# Patient Record
Sex: Female | Born: 1960
Health system: Southern US, Community
[De-identification: ages and names within clinical notes are randomized; demographics above are authoritative.]

## PROBLEM LIST (undated history)

## (undated) DIAGNOSIS — Z0189 Encounter for other specified special examinations: Secondary | ICD-10-CM

## (undated) DIAGNOSIS — E785 Hyperlipidemia, unspecified: Secondary | ICD-10-CM

## (undated) DIAGNOSIS — L0390 Cellulitis, unspecified: Secondary | ICD-10-CM

## (undated) DIAGNOSIS — F419 Anxiety disorder, unspecified: Secondary | ICD-10-CM

## (undated) DIAGNOSIS — I10 Essential (primary) hypertension: Secondary | ICD-10-CM

## (undated) DIAGNOSIS — T4145XA Adverse effect of unspecified anesthetic, initial encounter: Secondary | ICD-10-CM

## (undated) DIAGNOSIS — T8859XA Other complications of anesthesia, initial encounter: Secondary | ICD-10-CM

## (undated) DIAGNOSIS — I2510 Atherosclerotic heart disease of native coronary artery without angina pectoris: Secondary | ICD-10-CM

## (undated) DIAGNOSIS — F329 Major depressive disorder, single episode, unspecified: Secondary | ICD-10-CM

## (undated) DIAGNOSIS — K219 Gastro-esophageal reflux disease without esophagitis: Secondary | ICD-10-CM

## (undated) DIAGNOSIS — F32A Depression, unspecified: Secondary | ICD-10-CM

## (undated) DIAGNOSIS — I340 Nonrheumatic mitral (valve) insufficiency: Secondary | ICD-10-CM

## (undated) DIAGNOSIS — K5900 Constipation, unspecified: Secondary | ICD-10-CM

## (undated) DIAGNOSIS — R202 Paresthesia of skin: Secondary | ICD-10-CM

## (undated) HISTORY — DX: Hyperlipidemia, unspecified: E78.5

## (undated) HISTORY — DX: Encounter for other specified special examinations: Z01.89

## (undated) HISTORY — DX: Paresthesia of skin: R20.2

## (undated) HISTORY — PX: HERNIA REPAIR: SHX51

## (undated) HISTORY — PX: OOPHORECTOMY: SHX86

## (undated) HISTORY — PX: COLONOSCOPY WITH ESOPHAGOGASTRODUODENOSCOPY (EGD): SHX5779

## (undated) HISTORY — PX: TOTAL ABDOMINAL HYSTERECTOMY: SHX209

## (undated) HISTORY — PX: OTHER SURGICAL HISTORY: SHX169

## (undated) HISTORY — PX: ABDOMINAL HYSTERECTOMY: SHX81

---
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
## 2004-08-07 ENCOUNTER — Encounter: Admission: RE | Admit: 2004-08-07 | Discharge: 2004-08-07 | Payer: Self-pay | Admitting: Sports Medicine

## 2006-12-15 ENCOUNTER — Ambulatory Visit: Payer: Self-pay | Admitting: Sports Medicine

## 2006-12-15 ENCOUNTER — Encounter: Admission: RE | Admit: 2006-12-15 | Discharge: 2006-12-15 | Payer: Self-pay | Admitting: Sports Medicine

## 2007-07-13 ENCOUNTER — Ambulatory Visit: Payer: Self-pay | Admitting: Sports Medicine

## 2007-07-13 ENCOUNTER — Encounter (INDEPENDENT_AMBULATORY_CARE_PROVIDER_SITE_OTHER): Payer: Self-pay | Admitting: *Deleted

## 2007-07-13 DIAGNOSIS — M7710 Lateral epicondylitis, unspecified elbow: Secondary | ICD-10-CM | POA: Insufficient documentation

## 2007-08-05 ENCOUNTER — Encounter: Payer: Self-pay | Admitting: *Deleted

## 2007-09-25 ENCOUNTER — Encounter: Payer: Self-pay | Admitting: Sports Medicine

## 2007-09-28 ENCOUNTER — Encounter (INDEPENDENT_AMBULATORY_CARE_PROVIDER_SITE_OTHER): Payer: Self-pay | Admitting: *Deleted

## 2007-10-20 ENCOUNTER — Encounter: Payer: Self-pay | Admitting: Family Medicine

## 2008-04-21 ENCOUNTER — Encounter: Payer: Self-pay | Admitting: Family Medicine

## 2008-04-21 ENCOUNTER — Ambulatory Visit: Payer: Self-pay | Admitting: Family Medicine

## 2008-04-21 LAB — CONVERTED CEMR LAB: Rapid Strep: NEGATIVE

## 2008-05-06 ENCOUNTER — Encounter: Payer: Self-pay | Admitting: Family Medicine

## 2008-07-19 ENCOUNTER — Ambulatory Visit: Payer: Self-pay | Admitting: Sports Medicine

## 2008-07-19 DIAGNOSIS — IMO0002 Reserved for concepts with insufficient information to code with codable children: Secondary | ICD-10-CM | POA: Insufficient documentation

## 2008-07-20 ENCOUNTER — Encounter (INDEPENDENT_AMBULATORY_CARE_PROVIDER_SITE_OTHER): Payer: Self-pay | Admitting: *Deleted

## 2008-10-21 ENCOUNTER — Encounter: Payer: Self-pay | Admitting: Family Medicine

## 2009-04-17 ENCOUNTER — Ambulatory Visit: Payer: Self-pay | Admitting: Family Medicine

## 2009-04-17 DIAGNOSIS — M542 Cervicalgia: Secondary | ICD-10-CM | POA: Insufficient documentation

## 2009-04-17 DIAGNOSIS — I10 Essential (primary) hypertension: Secondary | ICD-10-CM | POA: Insufficient documentation

## 2009-05-17 ENCOUNTER — Ambulatory Visit: Payer: Self-pay | Admitting: Family Medicine

## 2009-05-17 DIAGNOSIS — K5900 Constipation, unspecified: Secondary | ICD-10-CM | POA: Insufficient documentation

## 2009-06-22 ENCOUNTER — Encounter: Payer: Self-pay | Admitting: Family Medicine

## 2009-06-23 ENCOUNTER — Telehealth: Payer: Self-pay | Admitting: *Deleted

## 2009-06-26 ENCOUNTER — Encounter: Admission: RE | Admit: 2009-06-26 | Discharge: 2009-06-26 | Payer: Self-pay | Admitting: Family Medicine

## 2009-07-04 ENCOUNTER — Telehealth (INDEPENDENT_AMBULATORY_CARE_PROVIDER_SITE_OTHER): Payer: Self-pay | Admitting: Family Medicine

## 2009-07-04 ENCOUNTER — Encounter: Payer: Self-pay | Admitting: Family Medicine

## 2009-07-21 ENCOUNTER — Ambulatory Visit: Payer: Self-pay | Admitting: Family Medicine

## 2009-07-21 ENCOUNTER — Encounter: Payer: Self-pay | Admitting: Family Medicine

## 2009-07-21 DIAGNOSIS — K219 Gastro-esophageal reflux disease without esophagitis: Secondary | ICD-10-CM | POA: Insufficient documentation

## 2009-07-27 ENCOUNTER — Encounter: Payer: Self-pay | Admitting: Family Medicine

## 2009-08-02 ENCOUNTER — Encounter: Payer: Self-pay | Admitting: Family Medicine

## 2009-08-03 ENCOUNTER — Encounter: Payer: Self-pay | Admitting: Family Medicine

## 2009-08-03 LAB — CONVERTED CEMR LAB
Cholesterol: 204 mg/dL
HDL: 48 mg/dL
LDL Cholesterol: 121 mg/dL
Triglycerides: 174 mg/dL

## 2009-08-11 ENCOUNTER — Encounter: Payer: Self-pay | Admitting: Family Medicine

## 2009-08-25 ENCOUNTER — Encounter: Payer: Self-pay | Admitting: Family Medicine

## 2009-09-13 ENCOUNTER — Encounter: Payer: Self-pay | Admitting: Family Medicine

## 2009-09-13 LAB — CONVERTED CEMR LAB
ALT: 36 units/L — ABNORMAL HIGH (ref 0–35)
Cholesterol: 166 mg/dL (ref 0–200)
HDL: 45 mg/dL (ref >39)
LDL Cholesterol: 81 mg/dL (ref 0–99)
Total CHOL/HDL Ratio: 3.7
Triglycerides: 200 mg/dL — ABNORMAL HIGH (ref <150)
VLDL: 40 mg/dL (ref 0–40)

## 2009-09-14 ENCOUNTER — Encounter: Payer: Self-pay | Admitting: Family Medicine

## 2009-10-11 ENCOUNTER — Ambulatory Visit: Payer: Self-pay | Admitting: Sports Medicine

## 2009-10-17 ENCOUNTER — Encounter: Payer: Self-pay | Admitting: Family Medicine

## 2009-10-19 ENCOUNTER — Encounter: Payer: Self-pay | Admitting: Family Medicine

## 2009-10-23 ENCOUNTER — Encounter: Payer: Self-pay | Admitting: Family Medicine

## 2009-11-06 ENCOUNTER — Encounter: Payer: Self-pay | Admitting: Family Medicine

## 2009-11-15 ENCOUNTER — Encounter: Payer: Self-pay | Admitting: Family Medicine

## 2009-11-28 ENCOUNTER — Ambulatory Visit: Payer: Self-pay | Admitting: Family Medicine

## 2009-11-28 DIAGNOSIS — I2510 Atherosclerotic heart disease of native coronary artery without angina pectoris: Secondary | ICD-10-CM | POA: Insufficient documentation

## 2009-11-29 ENCOUNTER — Encounter: Payer: Self-pay | Admitting: Family Medicine

## 2009-12-01 LAB — CONVERTED CEMR LAB
ALT: 21 units/L (ref 0–35)
AST: 18 units/L (ref 0–37)
Albumin: 4.3 g/dL (ref 3.5–5.2)
Alkaline Phosphatase: 58 units/L (ref 39–117)
BUN: 20 mg/dL (ref 6–23)
CO2: 27 meq/L (ref 19–32)
Calcium: 10.2 mg/dL (ref 8.4–10.5)
Chloride: 104 meq/L (ref 96–112)
Cholesterol: 175 mg/dL (ref 0–200)
Creatinine, Ser: 0.95 mg/dL (ref 0.40–1.20)
Glucose, Bld: 86 mg/dL (ref 70–99)
HDL: 44 mg/dL (ref >39)
LDL Cholesterol: 98 mg/dL (ref 0–99)
Potassium: 4.4 meq/L (ref 3.5–5.3)
Sodium: 142 meq/L (ref 135–145)
Total Bilirubin: 0.5 mg/dL (ref 0.3–1.2)
Total CHOL/HDL Ratio: 4
Total Protein: 7.1 g/dL (ref 6.0–8.3)
Triglycerides: 164 mg/dL — ABNORMAL HIGH (ref <150)
VLDL: 33 mg/dL (ref 0–40)

## 2010-01-23 ENCOUNTER — Encounter (INDEPENDENT_AMBULATORY_CARE_PROVIDER_SITE_OTHER): Payer: Self-pay | Admitting: *Deleted

## 2010-02-02 ENCOUNTER — Encounter: Payer: Self-pay | Admitting: Family Medicine

## 2010-02-08 ENCOUNTER — Encounter: Payer: Self-pay | Admitting: Family Medicine

## 2010-02-22 ENCOUNTER — Ambulatory Visit: Payer: Self-pay | Admitting: Family Medicine

## 2010-02-27 ENCOUNTER — Encounter: Payer: Self-pay | Admitting: Family Medicine

## 2010-02-28 ENCOUNTER — Encounter: Admission: RE | Admit: 2010-02-28 | Discharge: 2010-02-28 | Payer: Self-pay | Admitting: Family Medicine

## 2010-03-07 ENCOUNTER — Encounter: Payer: Self-pay | Admitting: Family Medicine

## 2010-03-07 LAB — CONVERTED CEMR LAB
HCT: 43.3 % (ref 36.0–46.0)
Hemoglobin: 13.2 g/dL (ref 12.0–15.0)
MCHC: 30.5 g/dL (ref 30.0–36.0)
MCV: 95.2 fL (ref 78.0–100.0)
Platelets: 371 10*3/uL (ref 150–400)
RBC: 4.55 M/uL (ref 3.87–5.11)
RDW: 14.3 % (ref 11.5–15.5)
WBC: 18.1 10*3/uL — ABNORMAL HIGH (ref 4.0–10.5)

## 2010-03-09 ENCOUNTER — Encounter: Payer: Self-pay | Admitting: Family Medicine

## 2010-03-09 ENCOUNTER — Encounter (INDEPENDENT_AMBULATORY_CARE_PROVIDER_SITE_OTHER): Payer: Self-pay | Admitting: *Deleted

## 2010-03-21 ENCOUNTER — Encounter: Payer: Self-pay | Admitting: Family Medicine

## 2010-05-04 ENCOUNTER — Encounter: Payer: Self-pay | Admitting: Family Medicine

## 2010-05-20 ENCOUNTER — Ambulatory Visit: Payer: Self-pay | Admitting: Family Medicine

## 2010-05-20 ENCOUNTER — Observation Stay (HOSPITAL_COMMUNITY): Admission: EM | Admit: 2010-05-20 | Discharge: 2010-05-22 | Payer: Self-pay | Admitting: Emergency Medicine

## 2010-05-20 DIAGNOSIS — G4459 Other complicated headache syndrome: Secondary | ICD-10-CM | POA: Insufficient documentation

## 2010-06-05 ENCOUNTER — Ambulatory Visit: Payer: Self-pay | Admitting: Family Medicine

## 2010-06-18 ENCOUNTER — Encounter: Payer: Self-pay | Admitting: Family Medicine

## 2010-08-20 ENCOUNTER — Encounter: Payer: Self-pay | Admitting: Family Medicine

## 2010-09-04 NOTE — Miscellaneous (Signed)
  Clinical Lists Changes continued (life long) problems w constipation. We tried reglan and no improvement. Will try daily dose go lytely and increase gradulally to no more than 8 oz once daily. Medications: Removed medication of PREDNISONE 20 MG TABS (PREDNISONE) 2 tabs by mouth once daily for 5 days Removed medication of BACTRIM DS 800-160 MG TABS (SULFAMETHOXAZOLE-TRIMETHOPRIM) 1 by mouth bid Removed medication of FLUCONAZOLE 150 MG TABS (FLUCONAZOLE) one today and one tomorrow Added new medication of GAVILYTE-N WITH FLAVOR PACK 420 GM SOLR (PEG 3350-KCL-NA BICARB-NACL) dilute with water as directed and sig as directed patient has written instructions - Signed Rx of GAVILYTE-N WITH FLAVOR PACK 420 GM SOLR (PEG 3350-KCL-NA BICARB-NACL) dilute with water as directed and sig as directed patient has written instructions;  #1 x 12;  Signed;  Entered by: Denny Levy MD;  Authorized by: Denny Levy MD;  Method used: Print then Give to Patient Observations: Added new observation of INSTRUCTIONS: Dilute the powdered PEG with water. refrigerate. Start with a 2 oz glass drinking one glass a day for a week.  Then increase the next week to four onces a day. Each week increase by two ounces until you are drinking 8 ounces a day. Do this for a month and then follow up with me. (11/15/2009 14:36)    Prescriptions: GAVILYTE-N WITH FLAVOR PACK 420 GM SOLR (PEG 3350-KCL-NA BICARB-NACL) dilute with water as directed and sig as directed patient has written instructions  #1 x 12   Entered and Authorized by:   Denny Levy MD   Signed by:   Denny Levy MD on 11/15/2009   Method used:   Print then Give to Patient   RxID:   [id number]    Complete Medication List: 1)  Fluticasone Propionate 50 Mcg/act Susp (Fluticasone propionate) .... 2 sprays each nostril qd 2)  Benicar Hct 40-12.5 Mg Tabs (Olmesartan medoxomil-hctz) .Marland Kitchen.. 1 by mouth qd 3)  Metoprolol Tartrate 25 Mg Tabs (Metoprolol tartrate) .Marland Kitchen.. 1 by mouth  bid 4)  Lipitor 80 Mg Tabs (Atorvastatin calcium) .Marland Kitchen.. 1 by mouth qd 5)  Plavix 75 Mg Tabs (Clopidogrel bisulfate) .Marland Kitchen.. 1 by mouth qd 6)  Gavilyte-n With Flavor Pack 420 Gm Solr (Peg 3350-kcl-na bicarb-nacl) .... Dilute with water as directed and sig as directed patient has written instructions   Patient Instructions: 1)  Dilute the powdered PEG with water. 2)  refrigerate. 3)  Start with a 2 oz glass drinking one glass a day for a week. 4)  Then increase the next week to four onces a day. Each week increase by two ounces until you are drinking 8 ounces a day. Do this for a month and then follow up with me.

## 2010-09-04 NOTE — Miscellaneous (Signed)
  Clinical Lists Changes  Medications: Changed medication from LIPITOR 40 MG TABS (ATORVASTATIN CALCIUM) 1 by mouth bid to LIPITOR 80 MG TABS (ATORVASTATIN CALCIUM) 1 by mouth qd - Signed Rx of LIPITOR 80 MG TABS (ATORVASTATIN CALCIUM) 1 by mouth qd;  #90 x 3;  Signed;  Entered by: Denny Levy MD;  Authorized by: Denny Levy MD;  Method used: Electronically to [facility]*, [address], [address] Shipping/mailing, [place], [place]  [address], Ph: [phone], Fax: [phone]    Prescriptions: LIPITOR 80 MG TABS (ATORVASTATIN CALCIUM) 1 by mouth qd  #90 x 3   Entered and Authorized by:   Denny Levy MD   Signed by:   Denny Levy MD on 10/19/2009   Method used:   Electronically to        [facility]* (retail)       [address]       [address] Shipping/mailing       [place], [place]  [address]       Ph: [phone]       Fax: [phone]   RxID:   [id number]

## 2010-09-04 NOTE — Miscellaneous (Signed)
  Clinical Lists Changes cold sore outbreak Medications: Added new medication of VALACYCLOVIR HCL 1 GM TABS (VALACYCLOVIR HCL) sig 2 by mouth now and repeat once in 12 hours - Signed Rx of VALACYCLOVIR HCL 1 GM TABS (VALACYCLOVIR HCL) sig 2 by mouth now and repeat once in 12 hours;  #4 x 1;  Signed;  Entered by: Denny Levy MD;  Authorized by: Denny Levy MD;  Method used: Electronically to [facility]*, [address], [address] Shipping/mailing, [place], [place]  [address], Ph: [phone], Fax: [phone]    Prescriptions: VALACYCLOVIR HCL 1 GM TABS (VALACYCLOVIR HCL) sig 2 by mouth now and repeat once in 12 hours  #4 x 1   Entered and Authorized by:   Denny Levy MD   Signed by:   Denny Levy MD on 02/02/2010   Method used:   Electronically to        [facility]* (retail)       [address]       [address] Shipping/mailing       [place], [place]  [address]       Ph: [phone]       Fax: [phone]   RxID:   [id number]

## 2010-09-04 NOTE — Miscellaneous (Signed)
  Clinical Lists Changes significant nasal congestion---similar to episodes in past. no fever, no headache, no chills although does admit to occasionally feeling a "chill". Appetite normal. Energy a little down Going out of town on vacation to [place] tomorrow.  Given her past hx of sinus issues, will treat qwith 5 day burst steroids, give her antibiotic rx to take with her--fill if notimproving or if fever new signs etc, Medications: Added new medication of PREDNISONE 20 MG TABS (PREDNISONE) sig 2 tabs by mouth once daily for 5 days - Signed Added new medication of ZITHROMAX 250 MG TABS (AZITHROMYCIN) generic please sig 2 tabs today by mouth and then one tabpo once daily for 4 more days - Signed Rx of PREDNISONE 20 MG TABS (PREDNISONE) sig 2 tabs by mouth once daily for 5 days;  #10 x 0;  Signed;  Entered by: Denny Levy MD;  Authorized by: Denny Levy MD;  Method used: Electronically to [facility]*, [address] Shipping/mailing, [place], [place]  [address], Ph: [phone], Fax: [phone] Rx of ZITHROMAX 250 MG TABS (AZITHROMYCIN) generic please sig 2 tabs today by mouth and then one tabpo once daily for 4 more days;  #6 x 0;  Signed;  Entered by: Denny Levy MD;  Authorized by: Denny Levy MD;  Method used: Print then Give to Patient    Prescriptions: ZITHROMAX 250 MG TABS (AZITHROMYCIN) generic please sig 2 tabs today by mouth and then one tabpo once daily for 4 more days  #6 x 0   Entered and Authorized by:   Denny Levy MD   Signed by:   Denny Levy MD on 02/08/2010   Method used:   Print then Give to Patient   RxID:   [id number] PREDNISONE 20 MG TABS (PREDNISONE) sig 2 tabs by mouth once daily for 5 days  #10 x 0   Entered and Authorized by:   Denny Levy MD   Signed by:   Denny Levy MD on 02/08/2010   Method used:   Electronically to        [facility]* (retail)       [address]       [address] Shipping/mailing  [place], [place]  [address]       Ph: [phone]       Fax: [phone]   RxID:   [id number]

## 2010-09-04 NOTE — Consult Note (Signed)
Summary: [facility]   Imported By: Clydell Hakim 08/29/2009 16:32:25  _____________________________________________________________________  External Attachment:    Type:   Image     Comment:   External Document

## 2010-09-04 NOTE — Miscellaneous (Signed)
  Clinical Lists Changes  Medications: Changed medication from KEFLEX 500 MG CAPS (CEPHALEXIN) generic please 1 by mouth tid to SUPRAX 400 MG TABS (CEFIXIME) 1 by mouth qd - Signed Rx of SUPRAX 400 MG TABS (CEFIXIME) 1 by mouth qd;  #20 x 1;  Signed;  Entered by: Denny Levy MD;  Authorized by: Denny Levy MD;  Method used: Electronically [facility]  [address] [phone]*, [address], [place], [place], [place]  [address], Ph: [phone] or [phone], Fax: [phone]    Prescriptions: SUPRAX 400 MG TABS (CEFIXIME) 1 by mouth qd  #20 x 1   Entered and Authorized by:   Denny Levy MD   Signed by:   Denny Levy MD on 03/09/2010   Method used:   Electronically to        [facility]  [address] [phone]* (retail)       [address]       [place], [place]  [address]       Ph: [phone] or [phone]       Fax: [phone]   RxID:   [id number]  [place] please call and cancel keflex--I am substituting suprax Thanks!  Denny Levy MD  March 09, 2010 10:55 AM DONE. Lillia Pauls CMA  March 09, 2010 12:12 PM

## 2010-09-04 NOTE — Miscellaneous (Signed)
  Clinical Lists Changes  Medications: Added new medication of FLUCONAZOLE 150 MG TABS (FLUCONAZOLE) one today and one tomorrow - Signed Rx of FLUCONAZOLE 150 MG TABS (FLUCONAZOLE) one today and one tomorrow;  #2 x 6;  Signed;  Entered by: Luretha Murphy NP;  Authorized by: Luretha Murphy NP;  Method used: Electronically to [facility]*, [address], [address] Shipping/mailing, [place], [place]  [address], Ph: [phone], Fax: [phone]    Prescriptions: FLUCONAZOLE 150 MG TABS (FLUCONAZOLE) one today and one tomorrow  #2 x 6   Entered and Authorized by:   Luretha Murphy NP   Signed by:   Luretha Murphy NP on 11/06/2009   Method used:   Electronically to        [facility]* (retail)       [address]       [address] Shipping/mailing       [place], [place]  [address]       Ph: [phone]       Fax: [phone]   RxID:   [id number]

## 2010-09-04 NOTE — Miscellaneous (Signed)
  Clinical Lists Changes  Problems: Added new problem of CAD (ICD-414.00) Orders: Added new Test order of Comp Met-[facility] [phone]) - Signed Added new Test order of Lipid-[facility] ([id number]) - Signed      Complete Medication List: 1)  Fluticasone Propionate 50 Mcg/act Susp (Fluticasone propionate) .... 2 sprays each nostril qd 2)  Benicar Hct 40-12.5 Mg Tabs (Olmesartan medoxomil-hctz) .Marland Kitchen.. 1 by mouth qd 3)  Metoprolol Tartrate 25 Mg Tabs (Metoprolol tartrate) .Marland Kitchen.. 1 by mouth bid 4)  Lipitor 80 Mg Tabs (Atorvastatin calcium) .Marland Kitchen.. 1 by mouth qd 5)  Plavix 75 Mg Tabs (Clopidogrel bisulfate) .Marland Kitchen.. 1 by mouth qd 6)  Gavilyte-n With Flavor Pack 420 Gm Solr (Peg 3350-kcl-na bicarb-nacl) .... Dilute with water as directed and sig as directed patient has written instructions

## 2010-09-04 NOTE — Miscellaneous (Signed)
  pt sts elbow pain was gettin worse and could not find armband. was in need of something to help pain subside. fitted pt for  elbow helix. Lillia Pauls CMA  January 23, 2010 10:52 AM

## 2010-09-04 NOTE — Miscellaneous (Signed)
  Clinical Lists Changes sinus issues for 2 weeks, not improving Medications: Added new medication of BACTRIM DS 800-160 MG TABS (SULFAMETHOXAZOLE-TRIMETHOPRIM) 1 by mouth bid - Signed Added new medication of PREDNISONE 20 MG TABS (PREDNISONE) 2 tabs by mouth once daily for 5 days - Signed Rx of BACTRIM DS 800-160 MG TABS (SULFAMETHOXAZOLE-TRIMETHOPRIM) 1 by mouth bid;  #20 x 0;  Signed;  Entered by: Denny Levy MD;  Authorized by: Denny Levy MD;  Method used: [facility]  [address] [phone]*, [address], [place], [place], [place]  [address], Ph: [phone] or [phone], Fax: [phone] Rx of PREDNISONE 20 MG TABS (PREDNISONE) 2 tabs by mouth once daily for 5 days;  #10 x 0;  Signed;  Entered by: Denny Levy MD;  Authorized by: Denny Levy MD;  Method used: Electronically to [facility]*, [address] Shipping/mailing, [place], [place]  [address], Ph: [phone], Fax: [phone]    Prescriptions: PREDNISONE 20 MG TABS (PREDNISONE) 2 tabs by mouth once daily for 5 days  #10 x 0   Entered and Authorized by:   Denny Levy MD   Signed by:   Denny Levy MD on 10/23/2009   Method used:   Electronically to        [facility]* (retail)       [address]       [address] Shipping/mailing       [place], [place]  [address]       Ph: [phone]       Fax: [phone]   RxID:   [id number] BACTRIM DS 800-160 MG TABS (SULFAMETHOXAZOLE-TRIMETHOPRIM) 1 by mouth bid  #20 x 0   Entered and Authorized by:   Denny Levy MD   Signed by:   Denny Levy MD on 10/23/2009   Method used:   [facility]        [facility]  [address] [phone]* (retail)       [address]       [place], [place]  [address]       Ph: [phone] or [phone]       Fax: [phone]   RxID:   [id number]

## 2010-09-04 NOTE — Miscellaneous (Signed)
  Clinical Lists Changes  Orders: Added new Test order of CBC w/Diff-[facility] ([address]) - Signed      Complete Medication List: 1)  Fluticasone Propionate 50 Mcg/act Susp (Fluticasone propionate) .... 2 sprays each nostril qd 2)  Benicar Hct 40-12.5 Mg Tabs (Olmesartan medoxomil-hctz) .Marland Kitchen.. 1 by mouth qd 3)  Metoprolol Tartrate 25 Mg Tabs (Metoprolol tartrate) .Marland Kitchen.. 1 by mouth bid 4)  Lipitor 80 Mg Tabs (Atorvastatin calcium) .Marland Kitchen.. 1 by mouth qd 5)  Plavix 75 Mg Tabs (Clopidogrel bisulfate) .Marland Kitchen.. 1 by mouth qd 6)  Gavilyte-n With Flavor Pack 420 Gm Solr (Peg 3350-kcl-na bicarb-nacl) .... Dilute with water as directed and sig as directed patient has written instructions 7)  Valacyclovir Hcl 1 Gm Tabs (Valacyclovir hcl) .... Sig 2 by mouth now and repeat once in 12 hours 8)  Prednisone 20 Mg Tabs (Prednisone) .... Sig 3 tabs a day for three days, two tabs a day for 6 days and one tab a day for three days 9)  Tessalon 200 Mg Caps (Benzonatate) .Marland Kitchen.. 1 by mouth q 8 hrs as needed cough 10)  Ventolin Hfa 108 (90 Base) Mcg/act Aers (Albuterol sulfate) .... 2 puffs three times a day prn 11)  Singulair 10 Mg Tabs (Montelukast sodium) .Marland Kitchen.. 1 by mouth once daily 12)  Zithromax Z-pak 250 Mg Tabs (Azithromycin) .... Generic please sig two tabs by mouth day one and one by mouth day 2-5

## 2010-09-04 NOTE — Letter (Signed)
Summary: FMLA  FMLA   Imported By: De Nurse 05/23/2010 16:28:26  _____________________________________________________________________  External Attachment:    Type:   Image     Comment:   External Document

## 2010-09-04 NOTE — Miscellaneous (Signed)
  Clinical Lists Changes  Medications: Added new medication of CARAFATE 1 GM TABS (SUCRALFATE) 1 by mouth before and  each meal at bedtime as needed - Signed Rx of CARAFATE 1 GM TABS (SUCRALFATE) 1 by mouth before and  each meal at bedtime as needed;  #120 x 1;  Signed;  Entered by: Denny Levy MD;  Authorized by: Denny Levy MD;  Method used: Electronically to [facility]*, [address] Shipping/mailing, [place], [place]  [address], Ph: [phone], Fax: [phone]    Prescriptions: CARAFATE 1 GM TABS (SUCRALFATE) 1 by mouth before and  each meal at bedtime as needed  #120 x 1   Entered and Authorized by:   Denny Levy MD   Signed by:   Denny Levy MD on 08/11/2009   Method used:   Electronically to        [facility]* (retail)       [address]       [address] Shipping/mailing       [place], [place]  [address]       Ph: [phone]       Fax: [phone]   RxID:   [id number]

## 2010-09-04 NOTE — Miscellaneous (Signed)
  Clinical Lists Changes continued sinus problems--sent her to ENT in [facility] where she had been before-. He placed her on omnicef which is giving her horrible diarhea. Cannot get hold of that office (Friday closed) so we will switch to keflex. ENT also did irrigation of sinuses. Medications: Added new medication of KEFLEX 500 MG CAPS (CEPHALEXIN) generic please 1 by mouth tid - Signed Rx of KEFLEX 500 MG CAPS (CEPHALEXIN) generic please 1 by mouth tid;  #30 x 1;  Signed;  Entered by: Denny Levy MD;  Authorized by: Denny Levy MD;  Method used: [facility]  [address] [phone]*, [address] [place], [place], [place]  [address], Ph: [phone] or [phone], Fax: [phone]    Prescriptions: KEFLEX 500 MG CAPS (CEPHALEXIN) generic please 1 by mouth tid  #30 x 1   Entered and Authorized by:   Denny Levy MD   Signed by:   Denny Levy MD on 03/09/2010   Method used:   [facility]        [facility]  [address] [phone]* (retail)       [address]       [place], [place]  [address]       Ph: [phone] or [phone]       Fax: [phone]   RxID:   [id number]   Appended Document:  changed my mind and will do Suprax as it gets H Flu better notified pt

## 2010-09-04 NOTE — Miscellaneous (Signed)
  Clinical Lists Changes this was on 40 of lipitor Observations: Added new observation of LDL: 121 mg/dL (40/98/1191 4:78) Added new observation of HDL: 48 mg/dL (29/56/2130 8:65) Added new observation of TRIGLYC TOT: 174 mg/dL (78/46/9629 5:28) Added new observation of CHOLESTEROL: 204 mg/dL (41/32/4401 0:27)

## 2010-09-04 NOTE — Consult Note (Signed)
Summary: Consultation Report  Consultation Report   Imported By: Marily Memos 10/11/2009 15:57:27  _____________________________________________________________________  External Attachment:    Type:   Image     Comment:   External Document

## 2010-09-04 NOTE — Assessment & Plan Note (Signed)
History of Present Illness: Contniued sinus congestion--it improved on the steroids and is not as bad as it was, but some still there. Now having hoarseness of voice, chest congestion. Cough that is aggravating and non productive, No fevers sweats or chills--feela a little more tired than usual but has been very busy as well. No SOB with exertion beyond her typical baseline. Chest feels "tight"--constant--does not change with exertion. Chest issues have been last 24-48 hours. No nausea vomiting or diarrhea.Occasional low grade frontal headache. No sore throat or ear pain.  Current Medications (verified): 1)  Fluticasone Propionate 50 Mcg/act Susp (Fluticasone Propionate) .... 2 Sprays Each Nostril Qd 2)  Benicar Hct 40-12.5 Mg Tabs (Olmesartan Medoxomil-Hctz) .Marland Kitchen.. 1 By Mouth Qd 3)  Metoprolol Tartrate 25 Mg Tabs (Metoprolol Tartrate) .Marland Kitchen.. 1 By Mouth Bid 4)  Lipitor 80 Mg Tabs (Atorvastatin Calcium) .Marland Kitchen.. 1 By Mouth Qd 5)  Plavix 75 Mg Tabs (Clopidogrel Bisulfate) .Marland Kitchen.. 1 By Mouth Qd 6)  Gavilyte-N With Flavor Pack 420 Gm Solr (Peg 3350-Kcl-Na Bicarb-Nacl) .... Dilute With Water As Directed and Sig As Directed Patient Has Written Instructions 7)  Valacyclovir Hcl 1 Gm Tabs (Valacyclovir Hcl) .... Sig 2 By Mouth Now and Repeat Once in 12 Hours 8)  Prednisone 20 Mg Tabs (Prednisone) .Marland Kitchen.. 1 By Mouth Once Daily For 7 Days 9)  Tessalon 200 Mg Caps (Benzonatate) .Marland Kitchen.. 1 By Mouth Q 8 Hrs As Needed Cough  Allergies: 1)  ! Erythromycin 2)  ! * Cyclines 3)  ! Pcn  Review of Systems       Please see HPI for additional ROS.   Physical Exam  General:  alert, well-developed, well-nourished, and well-hydrated.   Eyes:  conjunctiva is non icteric. Ears:  TM B normal Mouth:  OP without erythema or exudate Neck:  no lymphadenopathy Lungs:  normal respiratory effort, normal breath sounds, no dullness, and no wheezes.   Heart:  normal rate, regular rhythm, and no murmur.     Impression &  Recommendations:  Problem # 1:  UPPER RESPIRATORY INFECTION, VIRAL (ICD-465.9)  Her updated medication list for this problem includes:    Tessalon 200 Mg Caps (Benzonatate) .Marland Kitchen... 1 by mouth q 8 hrs as needed cough will treat symptomatically with cough suppressant and restart a slightly longer lower dose steroid burst. she will let me know if notimproving.  Complete Medication List: 1)  Fluticasone Propionate 50 Mcg/act Susp (Fluticasone propionate) .... 2 sprays each nostril qd 2)  Benicar Hct 40-12.5 Mg Tabs (Olmesartan medoxomil-hctz) .Marland Kitchen.. 1 by mouth qd 3)  Metoprolol Tartrate 25 Mg Tabs (Metoprolol tartrate) .Marland Kitchen.. 1 by mouth bid 4)  Lipitor 80 Mg Tabs (Atorvastatin calcium) .Marland Kitchen.. 1 by mouth qd 5)  Plavix 75 Mg Tabs (Clopidogrel bisulfate) .Marland Kitchen.. 1 by mouth qd 6)  Gavilyte-n With Flavor Pack 420 Gm Solr (Peg 3350-kcl-na bicarb-nacl) .... Dilute with water as directed and sig as directed patient has written instructions 7)  Valacyclovir Hcl 1 Gm Tabs (Valacyclovir hcl) .... Sig 2 by mouth now and repeat once in 12 hours 8)  Prednisone 20 Mg Tabs (Prednisone) .Marland Kitchen.. 1 by mouth once daily for 7 days 9)  Tessalon 200 Mg Caps (Benzonatate) .Marland Kitchen.. 1 by mouth q 8 hrs as needed cough  Other Orders: [facility]- Est Level  3 ([address]) Prescriptions: PREDNISONE 20 MG TABS (PREDNISONE) 1 by mouth once daily for 7 days  #7 x 0   Entered and Authorized by:   Denny Levy MD   Signed by:  Denny Levy MD on 02/22/2010   Method used:   Electronically to        [facility]* (retail)       [address]       [address] [place] Shipping/mailing       [place], [place]  [address]       Ph: [phone]       Fax: [phone]   RxID:   [id number] TESSALON 200 MG CAPS (BENZONATATE) 1 by mouth q 8 hrs as needed cough  #30 x 1   Entered and Authorized by:   Denny Levy MD   Signed by:   Denny Levy MD on 02/22/2010   Method used:   Electronically to        [facility]* (retail)        [address]       [address] Shipping/mailing       [place], [place]  [address]       Ph: [phone]       Fax: [phone]   RxID:   [id number]

## 2010-09-04 NOTE — Miscellaneous (Signed)
  Clinical Lists Changes  Medications: Added new medication of FLUOXETINE HCL 20 MG CAPS (FLUOXETINE HCL) 1 by mouth qd - Signed Removed medication of SUPRAX 400 MG TABS (CEFIXIME) 1 by mouth qd Removed medication of ZITHROMAX Z-PAK 250 MG TABS (AZITHROMYCIN) generic please sig two tabs by mouth day one and one by mouth day 2-5 Removed medication of GAVILYTE-N WITH FLAVOR PACK 420 GM SOLR (PEG 3350-KCL-NA BICARB-NACL) dilute with water as directed and sig as directed patient has written instructions Removed medication of TESSALON 200 MG CAPS (BENZONATATE) 1 by mouth q 8 hrs as needed cough Rx of FLUOXETINE HCL 20 MG CAPS (FLUOXETINE HCL) 1 by mouth qd;  #90 x 3;  Signed;  Entered by: Denny Levy MD;  Authorized by: Denny Levy MD;  Method used: Electronically to [facility]*, [address], [address] Shipping/mailing, [place], [place]  [address], Ph: [phone], Fax: [phone]    Prescriptions: FLUOXETINE HCL 20 MG CAPS (FLUOXETINE HCL) 1 by mouth qd  #90 x 3   Entered and Authorized by:   Denny Levy MD   Signed by:   Denny Levy MD on 03/21/2010   Method used:   Electronically to        [facility]* (retail)       [address]       [address] Shipping/mailing       [place], [place]  [address]       Ph: [phone]       Fax: [phone]   RxID:   [id number]

## 2010-09-04 NOTE — Miscellaneous (Signed)
  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 08/2009

## 2010-09-04 NOTE — Assessment & Plan Note (Signed)
Summary: 4:00 APPT,ELBOW PAIN,MC   Vital Signs:  Patient profile:   51 year old female BP sitting:   96 / 63  Vitals Entered By: Lillia Pauls CMA (October 11, 2009 4:09 PM)  CC:  elbow pain.  History of Present Illness: Pt here for right elbow pain.  She has had a surgery in the past to repair a tear of the common extensor muscles at the lateral epicondyle area. She is now starting to have similar symptoms to before her surgery.  Pain is located around the lateral epicondyle, right at her incision site.  Pain is worse with resisited extension of the wrist and the middle finger.   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 2 Sprays Each Nostril Qd 2)  Benicar Hct 40-12.5 Mg Tabs (Olmesartan Medoxomil-Hctz) .Marland Kitchen.. 1 By Mouth Qd 3)  Metoprolol Tartrate 25 Mg Tabs (Metoprolol Tartrate) .Marland Kitchen.. 1 By Mouth Bid 4)  Lipitor 40 Mg Tabs (Atorvastatin Calcium) .Marland Kitchen.. 1 By Mouth Bid 5)  Plavix 75 Mg Tabs (Clopidogrel Bisulfate) .Marland Kitchen.. 1 By Mouth Qd  Allergies: 1)  ! Erythromycin 2)  ! * Cyclines 3)  ! Pcn  Review of Systems MS:  Complains of joint pain and muscle aches. Neuro:  Denies numbness and tingling.  Physical Exam  General:  Well-developed,well-nourished,in no acute distress; alert,appropriate and cooperative throughout examination Msk:  right elbow: ROM normal strength 5/5 flexion and extension at the elbow strength 4/5 wrist extension, 5/5 flexion TTP along the lateral epicondyle   MSK U/S: surgical suture and some scarring noted, no tearing is seen, very mild amount of  effusion lat epicondule has old avuls fx at tip no abnorm vascularity images saved Psych:  Cognition and judgment appear intact. Alert and cooperative with normal attention span and concentration.    Impression & Recommendations:  Problem # 1:  ELBOW PAIN, RIGHT (ICD-719.42)  probable recurrence of epicondylitis no muscle tears seen  will prescribe eccentric rehab exercises voltaren gel 4 times daily tennis elbow strap given top wear daily f/u in 3-4 weeks  Orders: Pneumatic Arm Band (X9147) [place] LIMITED ([id number])  Problem # 2:  Hx of LATERAL EPICONDYLITIS, RIGHT (ICD-726.32) S/p surgery last year  I think she needs to keep on some reg exercise still with clericl work this may be a trigger  Complete Medication List: 1)  Fluticasone Propionate 50 Mcg/act Susp (Fluticasone propionate) .... 2 sprays each nostril qd 2)  Benicar Hct 40-12.5 Mg Tabs (Olmesartan medoxomil-hctz) .Marland Kitchen.. 1 by mouth qd 3)  Metoprolol Tartrate 25 Mg Tabs (Metoprolol tartrate) .Marland Kitchen.. 1 by mouth bid 4)  Lipitor 40 Mg Tabs (Atorvastatin calcium) .Marland Kitchen.. 1 by mouth bid 5)  Plavix 75 Mg Tabs (Clopidogrel bisulfate) .Marland Kitchen.. 1 by mouth qd  Appended Document: 4:00 APPT,ELBOW PAIN,MC Evaluated by Drs. Tarena Gockley and [employer].

## 2010-09-04 NOTE — Miscellaneous (Signed)
Clinical Lists Changes  Medications: Changed medication from PREDNISONE 20 MG TABS (PREDNISONE) 1 by mouth once daily for 7 days to PREDNISONE 20 MG TABS (PREDNISONE) sig 3 tabs a day for three days, two tabs a day for 6 days and one tab a day for three days - Signed Added new medication of SINGULAIR 10 MG TABS (MONTELUKAST SODIUM) 1 by mouth once daily - Signed Rx of PREDNISONE 20 MG TABS (PREDNISONE) sig 3 tabs a day for three days, two tabs a day for 6 days and one tab a day for three days;  #24 x 1;  Signed;  Entered by: Denny Levy MD;  Authorized by: Denny Levy MD;  Method used: Electronically to [facility]*, [address] Shipping/mailing, [place], [place]  [address], Ph: [phone], Fax: [phone] Rx of SINGULAIR 10 MG TABS (MONTELUKAST SODIUM) 1 by mouth once daily;  #90 x 3;  Signed;  Entered by: Denny Levy MD;  Authorized by: Denny Levy MD;  Method used: Electronically to [facility]*, [address], [address] Shipping/mailing, [place], [place]  [address], Ph: [phone], Fax: [phone] Orders: Added new Test order of CXR- 2view (CXR) - Signed    Prescriptions: SINGULAIR 10 MG TABS (MONTELUKAST SODIUM) 1 by mouth once daily  #90 x 3   Entered and Authorized by:   Denny Levy MD   Signed by:   Denny Levy MD on 02/27/2010   Method used:   Electronically to        [facility]* (retail)       [address]       [address] Shipping/mailing       [place], [place]  [address]       Ph: [phone]       Fax: [phone]   RxID:   [id number] PREDNISONE 20 MG TABS (PREDNISONE) sig 3 tabs a day for three days, two tabs a day for 6 days and one tab a day for three days  #24 x 1   Entered and Authorized by:   Denny Levy MD   Signed by:   Denny Levy MD on 02/27/2010   Method used:   Electronically to        [facility]* (retail)       [address]       [address]  Shipping/mailing       [place], [place]  [address]       Ph: [phone]       Fax: [phone]   RxID:   [id number]    Complete Medication List: 1)  Fluticasone Propionate 50 Mcg/act Susp (Fluticasone propionate) .... 2 sprays each nostril qd 2)  Benicar Hct 40-12.5 Mg Tabs (Olmesartan medoxomil-hctz) .Marland Kitchen.. 1 by mouth qd 3)  Metoprolol Tartrate 25 Mg Tabs (Metoprolol tartrate) .Marland Kitchen.. 1 by mouth bid 4)  Lipitor 80 Mg Tabs (Atorvastatin calcium) .Marland Kitchen.. 1 by mouth qd 5)  Plavix 75 Mg Tabs (Clopidogrel bisulfate) .Marland Kitchen.. 1 by mouth qd 6)  Gavilyte-n With Flavor Pack 420 Gm Solr (Peg 3350-kcl-na bicarb-nacl) .... Dilute with water as directed and sig as directed patient has written instructions 7)  Valacyclovir Hcl 1 Gm Tabs (Valacyclovir hcl) .... Sig 2 by mouth now and repeat once in 12 hours 8)  Prednisone 20 Mg Tabs (Prednisone) .... Sig 3 tabs a day for three days, two tabs a day for 6 days and one  tab a day for three days 9)  Tessalon 200 Mg Caps (Benzonatate) .Marland Kitchen.. 1 by mouth q 8 hrs as needed cough 10)  Ventolin Hfa 108 (90 Base) Mcg/act Aers (Albuterol sulfate) .... 2 puffs three times a day prn 11)  Singulair 10 Mg Tabs (Montelukast sodium) .Marland Kitchen.. 1 by mouth once daily

## 2010-09-04 NOTE — Miscellaneous (Signed)
  Clinical Lists Changes  Medications: Rx of LIPITOR 40 MG TABS (ATORVASTATIN CALCIUM) 1 by mouth bid;  #180 x 3;  Signed;  Entered by: Denny Levy MD;  Authorized by: Denny Levy MD;  Method used: Electronically to [facility]*, [address], [address] Shipping/mailing, [place], [place]  [address], Ph: [phone], Fax: [phone]    Prescriptions: LIPITOR 40 MG TABS (ATORVASTATIN CALCIUM) 1 by mouth bid  #180 x 3   Entered and Authorized by:   Denny Levy MD   Signed by:   Denny Levy MD on 10/17/2009   Method used:   Electronically to        [facility]* (retail)       [address]       [address] Shipping/mailing       [place], [place]  [address]       Ph: [phone]       Fax: [phone]   RxID:   [id number]

## 2010-09-04 NOTE — Assessment & Plan Note (Signed)
Summary: flu shot,df  Nurse Visit   Vital Signs:  Patient profile:   50 year old female Temp:     99 degrees F  Vitals Entered By: Theresia Lo RN (June 05, 2010 3:17 PM)  Allergies: 1)  ! Erythromycin 2)  ! * Cyclines 3)  ! Pcn  Immunizations Administered:  Influenza Vaccine # 1:    Vaccine Type: Fluvax 3+    Site: right deltoid    Mfr: GlaxoSmithKline    Dose: 0.5 ml    Route: IM    Given by: Theresia Lo RN    Exp. Date: 01/30/2011    Lot #: [id number]    VIS given: 02/27/10 version given June 05, 2010.  Flu Vaccine Consent Questions:    Do you have a history of severe allergic reactions to this vaccine? no    Any prior history of allergic reactions to egg and/or gelatin? no    Do you have a sensitivity to the preservative Thimersol? no    Do you have a past history of Guillan-Barre Syndrome? no    Do you currently have an acute febrile illness? no    Have you ever had a severe reaction to latex? no    Vaccine information given and explained to patient? yes    Are you currently pregnant? no  Orders Added: 1)  Flu Vaccine 45yrs + [90658] 2)  Admin 1st Vaccine [90471] 3)  No Charge Patient Arrived (NCPA0) [NCPA0]

## 2010-09-04 NOTE — Miscellaneous (Signed)
  Clinical Lists Changes Beginning of sinusitis signs again  will try z pak. no fever  Medications: Added new medication of AZITHROMYCIN 250 MG TABS (AZITHROMYCIN) sig 2 by mouth today and then 1 by mouth once daily for four more days - Signed Rx of AZITHROMYCIN 250 MG TABS (AZITHROMYCIN) sig 2 by mouth today and then 1 by mouth once daily for four more days;  #6 x 0;  Signed;  Entered by: Denny Levy MD;  Authorized by: Denny Levy MD;  Method used: Electronically to [facility]*, [address] Shipping/mailing, [place], [place]  [address], Ph: [phone], Fax: [phone]    Prescriptions: AZITHROMYCIN 250 MG TABS (AZITHROMYCIN) sig 2 by mouth today and then 1 by mouth once daily for four more days  #6 x 0   Entered and Authorized by:   Denny Levy MD   Signed by:   Denny Levy MD on 06/18/2010   Method used:   Electronically to        [facility]* (retail)       [address]       [address] Shipping/mailing       [place], [place]  [address]       Ph: [phone]       Fax: [phone]   RxID:   [id number]

## 2010-09-04 NOTE — Letter (Signed)
Summary: Generic Letter  [facility]  [address]   [place], [place] [address]   Phone: [phone]  Fax: [phone]    09/14/2009  [facility] [address] [place], [place]  [address]  Dear Ms. Impson,   Your total cholesterol improved from 204 to 161 moving from the 40 to 80 of lipitor. Your HDL decreased slightly but not where I would change anything, 48 to 45. And your LDl which is very important went from 121 to 81. I think our goal for LDL is certainly less than 100 and closer to 70. Your triglycerides bumped from 174 to 204 and that is probably related to Super Bowl Sunday. All in all a good report.        Sincerely,   Denny Levy MD

## 2010-09-06 NOTE — Miscellaneous (Signed)
  Clinical Lists Changes 3 dys posterior neckpain--intermittent. Similar to what she had before her stent was placed. Had to stop shopping yesterday secondary t to not feeling well. Lots of stressors--husbands illness, daughters wedding coming up Feb 7. 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  We discussed--she will call cardiologist now and get appt to be seen today or tomorrow. Will start prednisone inc ase this is realted to sinus dz or another episode lcct.  Medications: Removed medication of AZITHROMYCIN 250 MG TABS (AZITHROMYCIN) sig 2 by mouth today and then 1 by mouth once daily for four more days Added new medication of PREDNISONE 50 MG TABS (PREDNISONE) 1 by mouth once daily - Signed Rx of PREDNISONE 50 MG TABS (PREDNISONE) 1 by mouth once daily;  #7 x 1;  Signed;  Entered by: Denny Levy MD;  Authorized by: Denny Levy MD;  Method used: Electronically to [facility]*, [address] Shipping/mailing, [place], [place]  [address], Ph: [phone], Fax: [phone]    Prescriptions: PREDNISONE 50 MG TABS (PREDNISONE) 1 by mouth once daily  #7 x 1   Entered and Authorized by:   Denny Levy MD   Signed by:   Denny Levy MD on 08/20/2010   Method used:   Electronically to        [facility]* (retail)       [address]       [address] Shipping/mailing       [place], [place]  [address]       Ph: [phone]       Fax: [phone]   RxID:   [id number]

## 2010-09-24 ENCOUNTER — Encounter: Payer: Self-pay | Admitting: Family Medicine

## 2010-09-24 DIAGNOSIS — J329 Chronic sinusitis, unspecified: Secondary | ICD-10-CM | POA: Insufficient documentation

## 2010-10-02 NOTE — Miscellaneous (Signed)
Clinical Lists Changes  Problems: Added new problem of OTHER ACUTE SINUSITIS (ICD-461.8) Medications: Removed medication of PREDNISONE 50 MG TABS (PREDNISONE) 1 by mouth once daily Removed medication of ALPRAZOLAM 1 MG TABS (ALPRAZOLAM) 1 by mouth two times a day as needed anxiety Observations: Added new observation of PAST SURG HX: TAH BSAO bladder tack  abdominal hernai repair with mesh right elbow surgery sinus surgery---"hole" in sinuses (09/24/2010 11:50) Added new observation of SH REVIEWED: reviewed - no changes required (09/24/2010 11:50) Added new observation of FH REVIEWED: reviewed - no changes required (09/24/2010 11:50) Added new observation of LUNG EXAM: CTA (09/24/2010 11:50) Added new observation of HEART EXAM: RRR (09/24/2010 11:50) Added new observation of NECK EXAM: no LAD (09/24/2010 11:50) Added new observation of ORAL EXAM: OP clear (09/24/2010 11:50) Added new observation of NOSE EXAM: TTP left maxillary area. Lots of nasal mucosal swelling (09/24/2010 11:50) Added new observation of PEADULT: Miranda Levy MD ~General`Gen appear ~Nose`Nose exam ~Mouth`Oral exam ~Neck`NECK EXAM ~Heart`Heart exam ~Lungs`lung exam (09/24/2010 11:50) Added new observation of GEN APPEAR: alert, well-developed, well-nourished, and well-hydrated.   (09/24/2010 11:50) Added new observation of ROS:      The patient complains of fever and headaches.   (09/24/2010 11:50) Added new observation of ROS: NEURO: Complains of headaches (09/24/2010 11:50) Added new observation of JXB:JYNWGNF: Complains of fever (09/24/2010 11:50) Added new observation of NKA: F (09/24/2010 11:50) Added new observation of ALLERGY REV: Done (09/24/2010 11:50) Added new observation of MEDRECON: current updated (09/24/2010 11:50) Added new observation of HPI: 2-3 weeks sinus cingestion, fever, facial pzin. we treated her 2 weeks ago with a course of avelox and a steroid burst--maybe some better for a few days. Now having  chills, headache, worsening facial pain.  PERTINENT PMH/PSH: sinus surgery with a resultant "ho;e" sopmewhere in heer sinuses that occasionally she has had to have lavaged out  (previoous ENT was in another city0 (09/24/2010 11:50)      Impression & Recommendations:  Problem # 1:  OTHER ACUTE SINUSITIS (ICD-461.8) will send to ENT for eval today  Complete Medication List: 1)  Benicar Hct 40-12.5 Mg Tabs (Olmesartan medoxomil-hctz) .Marland Kitchen.. 1 by mouth qd 2)  Metoprolol Tartrate 25 Mg Tabs (Metoprolol tartrate) .Marland Kitchen.. 1 by mouth bid 3)  Lipitor 80 Mg Tabs (Atorvastatin calcium) .Marland Kitchen.. 1 by mouth qd 4)  Plavix 75 Mg Tabs (Clopidogrel bisulfate) .Marland Kitchen.. 1 by mouth qd 5)  Fluoxetine Hcl 20 Mg Caps (Fluoxetine hcl) .Marland Kitchen.. 1 by mouth qd   Family History: Reviewed history from 04/17/2009 and no changes required. Father CAD  Social History: Reviewed history from 05/20/2010 and no changes required. 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 we treated her 2 weeks ago with a course of avelox and a steroid burst--maybe some better for a few days. Now having chills, headache, worsening facial pain.  PERTINENT PMH/PSH: sinus surgery with a resultant "ho;e" sopmewhere in heer sinuses that occasionally she has had to have lavaged out  (  previoous ENT was in another city0   Current Medications  (verified): 1)  Benicar Hct 40-12.5 Mg Tabs (Olmesartan Medoxomil-Hctz) .Marland Kitchen.. 1 By Mouth Qd 2)  Metoprolol Tartrate 25 Mg Tabs (Metoprolol Tartrate) .Marland Kitchen.. 1 By Mouth Bid 3)  Lipitor 80 Mg Tabs (Atorvastatin Calcium) .Marland Kitchen.. 1 By Mouth Qd 4)  Plavix 75 Mg Tabs (Clopidogrel Bisulfate) .Marland Kitchen.. 1 By Mouth Qd 5)  Fluoxetine Hcl 20 Mg Caps (Fluoxetine Hcl) .Marland Kitchen.. 1 By Mouth Qd  Allergies (verified): 1)  ! Erythromycin 2)  ! * Cyclines 3)  ! Pcn    Review of Systems       The patient complains of fever and headaches.     Physical Exam  General:  alert, well-developed, well-nourished, and well-hydrated.   Nose:  TTP left maxillary area. Lots of nasal mucosal swelling Mouth:  OP clear Neck:  no LAD Lungs:  CTA Heart:  RRR

## 2010-10-17 LAB — DIFFERENTIAL
Basophils Absolute: 0 10*3/uL (ref 0.0–0.1)
Basophils Relative: 1 % (ref 0–1)
Eosinophils Absolute: 0.3 10*3/uL (ref 0.0–0.7)
Eosinophils Relative: 4 % (ref 0–5)
Lymphocytes Relative: 37 % (ref 12–46)
Lymphs Abs: 2.5 10*3/uL (ref 0.7–4.0)
Monocytes Absolute: 0.5 10*3/uL (ref 0.1–1.0)
Monocytes Relative: 8 % (ref 3–12)
Neutro Abs: 3.6 10*3/uL (ref 1.7–7.7)
Neutrophils Relative %: 51 % (ref 43–77)

## 2010-10-17 LAB — COMPREHENSIVE METABOLIC PANEL
ALT: 21 U/L (ref 0–35)
AST: 24 U/L (ref 0–37)
Albumin: 3.9 g/dL (ref 3.5–5.2)
Alkaline Phosphatase: 59 U/L (ref 39–117)
BUN: 17 mg/dL (ref 6–23)
CO2: 26 mEq/L (ref 19–32)
Calcium: 9.8 mg/dL (ref 8.4–10.5)
Chloride: 109 mEq/L (ref 96–112)
Creatinine, Ser: 0.85 mg/dL (ref 0.4–1.2)
GFR calc Af Amer: >60 mL/min (ref >60)
GFR calc non Af Amer: >60 mL/min (ref >60)
Glucose, Bld: 90 mg/dL (ref 70–99)
Potassium: 4.1 mEq/L (ref 3.5–5.1)
Sodium: 141 mEq/L (ref 135–145)
Total Bilirubin: 0.6 mg/dL (ref 0.3–1.2)
Total Protein: 6.7 g/dL (ref 6.0–8.3)

## 2010-10-17 LAB — CBC
HCT: 38.3 % (ref 36.0–46.0)
Hemoglobin: 12.7 g/dL (ref 12.0–15.0)
MCH: 29.7 pg (ref 26.0–34.0)
MCHC: 33.2 g/dL (ref 30.0–36.0)
MCV: 89.7 fL (ref 78.0–100.0)
Platelets: 251 10*3/uL (ref 150–400)
RBC: 4.27 MIL/uL (ref 3.87–5.11)
RDW: 12.6 % (ref 11.5–15.5)
WBC: 6.9 10*3/uL (ref 4.0–10.5)

## 2010-10-17 LAB — CK TOTAL AND CKMB (NOT AT ARMC)
CK, MB: 0.9 ng/mL (ref 0.3–4.0)
Relative Index: INVALID (ref 0.0–2.5)
Total CK: 81 U/L (ref 7–177)

## 2010-10-17 LAB — GLUCOSE, CAPILLARY: Glucose-Capillary: 82 mg/dL (ref 70–99)

## 2010-10-17 LAB — PROTIME-INR
INR: 0.97 (ref 0.00–1.49)
Prothrombin Time: 13.1 seconds (ref 11.6–15.2)

## 2010-10-17 LAB — APTT: aPTT: 30 seconds (ref 24–37)

## 2010-10-17 LAB — TROPONIN I: Troponin I: 0.02 ng/mL (ref 0.00–0.06)

## 2010-10-23 ENCOUNTER — Encounter: Payer: Self-pay | Admitting: Family Medicine

## 2010-10-23 NOTE — Progress Notes (Signed)
  Subjective:    Patient ID: Miranda Baker, female    DOB: 07/26/1961, 50 y.o.   MRN: [id number]  HPI Several days right knee pain--no specific injury. Sharp--intermittent--hurts if she puts pressure on lateral side of knee. No previous injury   Review of Systems     Objective:   Physical Exam     mcmurray neg ligamentously  Intact  [place] small amount of fluid around lateral meniscus--mildy deformed but not torn   Assessment & Plan:  Mild mensical injury Conservative therapy and f/u

## 2010-10-24 ENCOUNTER — Ambulatory Visit (INDEPENDENT_AMBULATORY_CARE_PROVIDER_SITE_OTHER): Payer: 59 | Admitting: Family Medicine

## 2010-10-24 DIAGNOSIS — R202 Paresthesia of skin: Secondary | ICD-10-CM

## 2010-10-24 DIAGNOSIS — E785 Hyperlipidemia, unspecified: Secondary | ICD-10-CM

## 2010-10-24 DIAGNOSIS — R200 Anesthesia of skin: Secondary | ICD-10-CM

## 2010-10-24 DIAGNOSIS — R209 Unspecified disturbances of skin sensation: Secondary | ICD-10-CM

## 2010-10-24 MED ORDER — GABAPENTIN 100 MG PO CAPS: 100.0000 mg | ORAL_CAPSULE | Freq: Every day | ORAL | Status: DC

## 2010-10-24 NOTE — Patient Instructions (Signed)
Stop Lipitor for 2 weeks

## 2010-10-24 NOTE — Progress Notes (Signed)
  Subjective:    Patient ID: Miranda Baker, female    DOB: 10/04/1960, 50 y.o.   MRN: [id number]  HPI  Pain in distal arms and hands and distal legs and feet that starts 1 hour after falling asleep. Has had some symptoms of restless legs for years and thought initially this was related to that butit has gotten significantly worse in last month. Now affecting her hands more--used to resolve with tylenol or NSAID. Benadryl seems to mmake it worse as does benzodiazepine. Pain is like "you have stood on your feet for hours and hours and they are swollen--feel like tree stumps'. Standing and walking seems to help some but last night it lasted for several hours despite what she tried. Pain was 4-7/10.  Review of Systems  Constitutional: Negative for fever, chills, activity change, appetite change, fatigue and unexpected weight change.  Respiratory: Negative for shortness of breath and wheezing.   Cardiovascular: Negative for chest pain, palpitations and leg swelling.  Musculoskeletal: Negative for back pain, joint swelling and gait problem.  Neurological: Negative for dizziness, syncope and weakness.       Objective:   Physical Exam  Constitutional: She appears well-developed and well-nourished.  Neck: Normal range of motion. Neck supple.  Cardiovascular: Normal rate and regular rhythm.   Musculoskeletal:       Normal muscle bulk and tone, symmetrical.   NEURO: normal sensation to soft touch, normal proprioception. Normal GAIT 2+ DTRs knee ankle wrist          Assessment & Plan:

## 2010-10-25 LAB — CBC
HCT: 38.5 % (ref 36.0–46.0)
Hemoglobin: 12.5 g/dL (ref 12.0–15.0)
MCH: 28.5 pg (ref 26.0–34.0)
MCHC: 32.5 g/dL (ref 30.0–36.0)
MCV: 87.7 fL (ref 78.0–100.0)
RBC: 4.39 MIL/uL (ref 3.87–5.11)
WBC: 5.8 10*3/uL (ref 4.0–10.5)

## 2010-10-25 LAB — COMPREHENSIVE METABOLIC PANEL
ALT: 19 U/L (ref 0–35)
AST: 22 U/L (ref 0–37)
Albumin: 4.8 g/dL (ref 3.5–5.2)
Alkaline Phosphatase: 61 U/L (ref 39–117)
BUN: 22 mg/dL (ref 6–23)
CO2: 27 mEq/L (ref 19–32)
Calcium: 10.8 mg/dL — ABNORMAL HIGH (ref 8.4–10.5)
Creat: 0.83 mg/dL (ref 0.40–1.20)
Glucose, Bld: 90 mg/dL (ref 70–99)
Potassium: 3.9 mEq/L (ref 3.5–5.3)
Total Bilirubin: 0.5 mg/dL (ref 0.3–1.2)

## 2010-10-25 LAB — TSH: TSH: 2.959 u[IU]/mL (ref 0.350–4.500)

## 2010-10-25 LAB — CK: Total CK: 79 U/L (ref 7–177)

## 2010-10-25 LAB — SEDIMENTATION RATE: Sed Rate: 1 mm/hr (ref 0–22)

## 2010-10-25 LAB — VITAMIN B12: Vitamin B-12: 650 pg/mL (ref 211–911)

## 2010-10-25 LAB — LDL CHOLESTEROL, DIRECT: Direct LDL: 95 mg/dL

## 2010-11-05 ENCOUNTER — Ambulatory Visit: Payer: Commercial Managed Care - PPO

## 2010-11-05 ENCOUNTER — Ambulatory Visit
Admission: RE | Admit: 2010-11-05 | Discharge: 2010-11-05 | Disposition: A | Discharge status: Home / Self Care | Payer: Commercial Managed Care - PPO | Source: Ambulatory Visit | Attending: Family Medicine | Admitting: Family Medicine

## 2010-11-05 ENCOUNTER — Other Ambulatory Visit: Payer: Self-pay | Admitting: Family Medicine

## 2010-11-05 DIAGNOSIS — R51 Headache: Secondary | ICD-10-CM

## 2010-11-05 MED ORDER — METOPROLOL TARTRATE 25 MG PO TABS: 25.0000 mg | ORAL_TABLET | Freq: Two times a day (BID) | ORAL | Status: DC

## 2010-11-06 ENCOUNTER — Other Ambulatory Visit: Payer: Commercial Managed Care - PPO

## 2010-11-22 ENCOUNTER — Ambulatory Visit (INDEPENDENT_AMBULATORY_CARE_PROVIDER_SITE_OTHER): Payer: Commercial Managed Care - PPO | Admitting: Family Medicine

## 2010-11-22 DIAGNOSIS — E785 Hyperlipidemia, unspecified: Secondary | ICD-10-CM

## 2010-11-22 DIAGNOSIS — R200 Anesthesia of skin: Secondary | ICD-10-CM

## 2010-11-22 DIAGNOSIS — R209 Unspecified disturbances of skin sensation: Secondary | ICD-10-CM

## 2010-11-22 DIAGNOSIS — I2510 Atherosclerotic heart disease of native coronary artery without angina pectoris: Secondary | ICD-10-CM

## 2010-11-22 NOTE — Progress Notes (Signed)
  Subjective:    Patient ID: Miranda Baker, female    DOB: 02-10-1961, 50 y.o.   MRN: [id number]  HPI  #1: Followup nocturnal leg pains. He is up to 400 mg of gabapentin. Has stopped her cholesterol medicine. Does note some slight improvement in episodes but is still having multiple episodes per week, there still very painful. Episode starts as an itching in the lower extremity. This precedes the pain by about 45 minutes. The pain is burning, sharp, lancinating. This can last for several hours. It is more frequent in her lower extremities and starts about mid lower leg down to the feet. She occasionally has it in her hands and arms but it is less severe. It never occurs during the day, never occurs during exertion. Always occurs at night.  #2 significant stressors in her life.  #3 cardiovascular disease and hyperlipidemia: We had stopped her cholesterol medicine for while and she is concerned about being off it but she does note that some of her lower strip and his symptoms were better off it. Wonders if she could decrease the dose if she needs to go back on it.  Review of Systems    no unexpected weight change, no activity change, denies suicidal ideation. Intermittent episodes of tearfulness related to stressors. Denies chest pain, shortness of breath, headache. Objective:   Physical Exam      GENERALl: Well developed, well nourished, in no acute distress. NECK: Supple, FROM, without lymphadenopathy.  THYROID: normal without nodularity CAROTID ARTERIES: without bruits LUNGS: clear to auscultation bilaterally. No wheezes or rales. HEART: Regular rate and rhythm, no murmurs ABDOMEN: soft with positive bowel sounds NEURO: No gross focal deficits    Assessment & Plan:  #1: Lower extremity nocturnal pains that are episodic. This could be an unusual presentation of restless legs. It seems slightly improved on the gabapentin so we will increase that to 600 mg at night. Also wonder if there  is a trigger for component of swelling from dependent edema and will have her start support hose daily. We will also do a trial of capsaicin cream. I would like to try this at the early warning sign of the itching, and tried on one leg. She will let me know how this goes  #2: Significant stressors. We discussed at length. I recommended getting back in touch with her therapist.  #3: Hyperlipidemia. I will review her last LDL make a decision about her dose but this time I want her to return to her previous dosing. I would like her to followup for these and other issues within one month.

## 2011-01-03 ENCOUNTER — Other Ambulatory Visit: Payer: Self-pay | Admitting: Family Medicine

## 2011-01-03 MED ORDER — OLMESARTAN MEDOXOMIL-HCTZ 40-12.5 MG PO TABS: 1.0000 | ORAL_TABLET | Freq: Every day | ORAL | Status: DC

## 2011-01-09 ENCOUNTER — Ambulatory Visit (INDEPENDENT_AMBULATORY_CARE_PROVIDER_SITE_OTHER): Payer: 59 | Admitting: Family Medicine

## 2011-01-09 VITALS — BP 102/68 | HR 50 | Ht 64.0 in | Wt 199.2 lb

## 2011-01-09 DIAGNOSIS — F341 Dysthymic disorder: Secondary | ICD-10-CM

## 2011-01-09 DIAGNOSIS — R209 Unspecified disturbances of skin sensation: Secondary | ICD-10-CM

## 2011-01-09 DIAGNOSIS — I2510 Atherosclerotic heart disease of native coronary artery without angina pectoris: Secondary | ICD-10-CM

## 2011-01-09 DIAGNOSIS — R200 Anesthesia of skin: Secondary | ICD-10-CM

## 2011-01-09 DIAGNOSIS — R109 Unspecified abdominal pain: Secondary | ICD-10-CM

## 2011-01-09 LAB — COMPREHENSIVE METABOLIC PANEL
AST: 19 U/L (ref 0–37)
Albumin: 4.6 g/dL (ref 3.5–5.2)
Alkaline Phosphatase: 58 U/L (ref 39–117)
BUN: 19 mg/dL (ref 6–23)
Calcium: 10.1 mg/dL (ref 8.4–10.5)
Chloride: 102 mEq/L (ref 96–112)
Glucose, Bld: 83 mg/dL (ref 70–99)
Potassium: 3.9 mEq/L (ref 3.5–5.3)
Sodium: 137 mEq/L (ref 135–145)
Total Protein: 7.2 g/dL (ref 6.0–8.3)

## 2011-01-09 MED ORDER — ATORVASTATIN CALCIUM 40 MG PO TABS: 40.0000 mg | ORAL_TABLET | Freq: Every day | ORAL | Status: DC

## 2011-01-09 MED ORDER — FLUOXETINE HCL 40 MG PO CAPS: 40.0000 mg | ORAL_CAPSULE | Freq: Every day | ORAL | Status: DC

## 2011-01-09 NOTE — Progress Notes (Signed)
  Subjective:    Patient ID: Miranda Baker, female    DOB: [birth year], 50 y.o.   MRN: [id number]  HPI 1) f/u leg pains--got much better on the gabapentin but that made her feel goofy. Micah Flesher out of town and forgot that rx so was without it---no return of leg pains of previous magnitude--so she has stopped it.   2) abdominal pain--feels like  She is bloated all of the time. Bowels moving more regularly ---fells like when she had adhesions before. Saw her GYn who tx her for BV--the exam was atypically painful and caused similar pains to those she is having now--radiates from pelvis into back. No weigt loss but appetite is down. Energy same  3) Stresses with her husband's illness increased some and she increased her prozac to 40---feels better on the 40 mg dose. Denies SI/HI 4) we had decreased her lipitor while working up her leg pains--will check direct  LDL today.  Review of Systems See hpi    Objective:   Physical Exam     GENERALl: Well developed, well nourished, in no acute distress. NECK: Supple, FROM, without lymphadenopathy.  LUNGS: clear to auscultation bilaterally. No wheezes or rales. HEART: Regular rate and rhythm, no murmurs ABDOMEN: soft with positive bowel sounds Very mildly diffusely ttp. No rebound or guarding NEURO: No gross focal deficits    Assessment & Plan:  1) leg pains seem better--will follow. Given improvement on gabapentin seems neuropathic. Unclear why they have remained improved off med but good news. 2) Abdominal pain--has had multiple surgeries. Adhesions most likely issue. I think she would best be served by return to her gen Careers adviser. Will do referral letter. 3) Dysthymia--agree with increase 4) LDL rtc 34m

## 2011-04-01 ENCOUNTER — Other Ambulatory Visit: Payer: Self-pay | Admitting: Family Medicine

## 2011-04-01 MED ORDER — ALPRAZOLAM 1 MG PO TABS: 1.0000 mg | ORAL_TABLET | Freq: Two times a day (BID) | ORAL | Status: DC | PRN

## 2011-07-05 ENCOUNTER — Other Ambulatory Visit: Payer: Self-pay | Admitting: Family Medicine

## 2011-07-05 MED ORDER — LEVOFLOXACIN 500 MG PO TABS: 500.0000 mg | ORAL_TABLET | Freq: Every day | ORAL | Status: AC

## 2011-07-05 NOTE — Progress Notes (Signed)
Left sided facial pain last 48 hours. Has been more congested---left eye area and left bridge of nose sore--noticed when she put her glasses on it was uncomfortable. No fever. Chronic stuffy nose with daily drainage---drainage usu clear looks grey now. EXAM_ttp left maxillary sinus. Left turnbinates swollen.  If not improving by Monday needs to see her ENT.

## 2011-07-22 ENCOUNTER — Other Ambulatory Visit: Payer: Self-pay | Admitting: Family Medicine

## 2011-07-22 MED ORDER — MONTELUKAST SODIUM 10 MG PO TABS: 10.0000 mg | ORAL_TABLET | Freq: Every day | ORAL | Status: DC

## 2011-08-14 ENCOUNTER — Other Ambulatory Visit: Payer: Self-pay | Admitting: Family Medicine

## 2011-08-14 MED ORDER — NEOMYCIN-POLYMYXIN-HC 3.5-10000-1 OT SOLN: 3.0000 [drp] | Freq: Four times a day (QID) | OTIC | Status: AC

## 2011-08-14 NOTE — Progress Notes (Signed)
Left ear itching and rainage starting this am. No foreign body--no fever, no pain. EXAM: no LAD. External ear canal some exudate ---TM is retracted but nomral and intact Otitis Externa Will do dropps and f/uif  not improving

## 2011-08-30 ENCOUNTER — Other Ambulatory Visit: Payer: Self-pay | Admitting: Family Medicine

## 2011-10-28 ENCOUNTER — Other Ambulatory Visit: Payer: Self-pay | Admitting: Family Medicine

## 2011-10-28 DIAGNOSIS — J309 Allergic rhinitis, unspecified: Secondary | ICD-10-CM | POA: Insufficient documentation

## 2011-10-28 DIAGNOSIS — J018 Other acute sinusitis: Secondary | ICD-10-CM

## 2011-10-28 DIAGNOSIS — J3089 Other allergic rhinitis: Secondary | ICD-10-CM

## 2011-10-28 MED ORDER — MONTELUKAST SODIUM 10 MG PO TABS: 10.0000 mg | ORAL_TABLET | Freq: Every day | ORAL | Status: DC

## 2011-10-28 MED ORDER — PREDNISONE 50 MG PO TABS: ORAL_TABLET | ORAL | Status: AC

## 2011-11-25 ENCOUNTER — Other Ambulatory Visit: Payer: Self-pay | Admitting: Family Medicine

## 2011-11-25 MED ORDER — METOPROLOL TARTRATE 25 MG PO TABS: 25.0000 mg | ORAL_TABLET | Freq: Two times a day (BID) | ORAL | Status: DC

## 2011-11-25 MED ORDER — ATORVASTATIN CALCIUM 40 MG PO TABS: 40.0000 mg | ORAL_TABLET | Freq: Every day | ORAL | Status: DC

## 2011-11-27 ENCOUNTER — Telehealth: Payer: Self-pay | Admitting: *Deleted

## 2011-11-27 ENCOUNTER — Other Ambulatory Visit: Payer: Self-pay | Admitting: Family Medicine

## 2011-11-27 MED ORDER — ATORVASTATIN CALCIUM 80 MG PO TABS: 80.0000 mg | ORAL_TABLET | Freq: Every day | ORAL | Status: DC

## 2011-11-27 NOTE — Telephone Encounter (Signed)
Pharmacy calling to verify med they received on 11/25/11.  Unable to read first name written on Rx.  Info given.  Also, verifying both meds they received (metoprolol & Lipitor).  Per Dr. Neal---patient should be on Lipitor 80mg  daily and she will make correction in Epic.  Pharmacy informed.  Gaylene Brooks, RN

## 2011-12-03 ENCOUNTER — Other Ambulatory Visit: Payer: Self-pay | Admitting: Family Medicine

## 2011-12-03 MED ORDER — PREDNISONE 20 MG PO TABS: ORAL_TABLET | ORAL | Status: DC

## 2011-12-03 NOTE — Progress Notes (Signed)
Six days of head cold that will not seem to go away---cough productive scant sputum. No fever, no new SOB. , Lungs are CTA with occasional expiratory wheeze at bases--mostly clears w cough. We discussed___   Lots of stressors at her house. I suspect she is very immune compromised by her stress (husband transplant list, recent visit to [facility], financial stressors, son and daughter marital issues etc). Nasal congestion is pretty profound but not really tt percussion over sinuses. Given her significant hx of sinus dz etc , I think best option is five day steroid burst. Has worked very well for her in past.

## 2011-12-13 ENCOUNTER — Other Ambulatory Visit: Payer: Self-pay | Admitting: Otolaryngology

## 2011-12-13 ENCOUNTER — Ambulatory Visit
Admission: RE | Admit: 2011-12-13 | Discharge: 2011-12-13 | Disposition: A | Discharge status: Home / Self Care | Payer: 59 | Source: Ambulatory Visit | Attending: Otolaryngology | Admitting: Otolaryngology

## 2011-12-13 DIAGNOSIS — H93299 Other abnormal auditory perceptions, unspecified ear: Secondary | ICD-10-CM

## 2012-01-07 ENCOUNTER — Other Ambulatory Visit: Payer: Self-pay | Admitting: Family Medicine

## 2012-01-07 MED ORDER — METRONIDAZOLE 500 MG PO TABS: 500.0000 mg | ORAL_TABLET | Freq: Three times a day (TID) | ORAL | Status: DC

## 2012-01-07 NOTE — Progress Notes (Signed)
Has recently been on abx for sinus issues (per ENT)--havig return of watery mucousy diarrhea---- has had this before and was C Diff. Out of town until later today---will tx empirically and she will f/u if not resolving.

## 2012-01-17 ENCOUNTER — Encounter (HOSPITAL_COMMUNITY): Payer: Self-pay | Admitting: *Deleted

## 2012-01-17 ENCOUNTER — Other Ambulatory Visit: Payer: Self-pay

## 2012-01-17 ENCOUNTER — Emergency Department (HOSPITAL_COMMUNITY): Payer: 59

## 2012-01-17 ENCOUNTER — Observation Stay (HOSPITAL_COMMUNITY)
Admission: EM | Admit: 2012-01-17 | Discharge: 2012-01-18 | Disposition: A | Discharge status: Home / Self Care | Payer: 59 | Attending: Cardiology | Admitting: Cardiology

## 2012-01-17 ENCOUNTER — Ambulatory Visit (INDEPENDENT_AMBULATORY_CARE_PROVIDER_SITE_OTHER): Payer: 59 | Admitting: Family Medicine

## 2012-01-17 VITALS — BP 124/78 | HR 61

## 2012-01-17 DIAGNOSIS — I252 Old myocardial infarction: Secondary | ICD-10-CM | POA: Insufficient documentation

## 2012-01-17 DIAGNOSIS — R079 Chest pain, unspecified: Secondary | ICD-10-CM

## 2012-01-17 DIAGNOSIS — I2510 Atherosclerotic heart disease of native coronary artery without angina pectoris: Principal | ICD-10-CM | POA: Insufficient documentation

## 2012-01-17 DIAGNOSIS — R7309 Other abnormal glucose: Secondary | ICD-10-CM | POA: Insufficient documentation

## 2012-01-17 DIAGNOSIS — I10 Essential (primary) hypertension: Secondary | ICD-10-CM | POA: Insufficient documentation

## 2012-01-17 DIAGNOSIS — K219 Gastro-esophageal reflux disease without esophagitis: Secondary | ICD-10-CM | POA: Insufficient documentation

## 2012-01-17 DIAGNOSIS — R0609 Other forms of dyspnea: Secondary | ICD-10-CM | POA: Insufficient documentation

## 2012-01-17 DIAGNOSIS — R0989 Other specified symptoms and signs involving the circulatory and respiratory systems: Secondary | ICD-10-CM | POA: Insufficient documentation

## 2012-01-17 DIAGNOSIS — I200 Unstable angina: Secondary | ICD-10-CM | POA: Diagnosis present

## 2012-01-17 DIAGNOSIS — Z9861 Coronary angioplasty status: Secondary | ICD-10-CM | POA: Insufficient documentation

## 2012-01-17 DIAGNOSIS — E785 Hyperlipidemia, unspecified: Secondary | ICD-10-CM | POA: Insufficient documentation

## 2012-01-17 HISTORY — DX: Cellulitis, unspecified: L03.90

## 2012-01-17 HISTORY — DX: Nonrheumatic mitral (valve) insufficiency: I34.0

## 2012-01-17 HISTORY — DX: Atherosclerotic heart disease of native coronary artery without angina pectoris: I25.10

## 2012-01-17 HISTORY — DX: Gastro-esophageal reflux disease without esophagitis: K21.9

## 2012-01-17 HISTORY — DX: Constipation, unspecified: K59.00

## 2012-01-17 HISTORY — DX: Essential (primary) hypertension: I10

## 2012-01-17 LAB — BASIC METABOLIC PANEL
CO2: 28 mEq/L (ref 19–32)
Calcium: 10.5 mg/dL (ref 8.4–10.5)
Creatinine, Ser: 0.91 mg/dL (ref 0.50–1.10)

## 2012-01-17 LAB — CBC
HCT: 37.9 % (ref 36.0–46.0)
MCH: 29.6 pg (ref 26.0–34.0)
MCHC: 33.2 g/dL (ref 30.0–36.0)
MCV: 89.2 fL (ref 78.0–100.0)
RDW: 13 % (ref 11.5–15.5)

## 2012-01-17 LAB — DIFFERENTIAL
Basophils Absolute: 0 10*3/uL (ref 0.0–0.1)
Basophils Relative: 1 % (ref 0–1)
Eosinophils Relative: 3 % (ref 0–5)
Lymphocytes Relative: 38 % (ref 12–46)
Monocytes Absolute: 0.6 10*3/uL (ref 0.1–1.0)

## 2012-01-17 LAB — URINALYSIS, ROUTINE W REFLEX MICROSCOPIC
Glucose, UA: NEGATIVE mg/dL
Ketones, ur: NEGATIVE mg/dL
Leukocytes, UA: NEGATIVE
Protein, ur: NEGATIVE mg/dL

## 2012-01-17 LAB — CARDIAC PANEL(CRET KIN+CKTOT+MB+TROPI): Total CK: 110 U/L (ref 7–177)

## 2012-01-17 MED ORDER — FLUOXETINE HCL 20 MG PO CAPS: 40.0000 mg | ORAL_CAPSULE | Freq: Every day | ORAL | Status: DC

## 2012-01-17 MED ORDER — OLMESARTAN MEDOXOMIL-HCTZ 40-12.5 MG PO TABS: 1.0000 | ORAL_TABLET | Freq: Every day | ORAL | Status: DC

## 2012-01-17 MED ORDER — NITROGLYCERIN 0.4 MG SL SUBL
0.4000 mg | SUBLINGUAL_TABLET | Freq: Once | SUBLINGUAL | Status: AC
  Administered 2012-01-17: 0.4 mg via SUBLINGUAL

## 2012-01-17 MED ORDER — METOPROLOL TARTRATE 25 MG PO TABS
25.0000 mg | ORAL_TABLET | Freq: Two times a day (BID) | ORAL | Status: DC
  Administered 2012-01-17 – 2012-01-18 (×2): 25 mg via ORAL
  Filled 2012-01-17 (×3): qty 1

## 2012-01-17 MED ORDER — ASPIRIN 81 MG PO CHEW
324.0000 mg | CHEWABLE_TABLET | ORAL | Status: AC
  Administered 2012-01-17: 324 mg via ORAL
  Filled 2012-01-17 (×2): qty 1
  Filled 2012-01-17: qty 4
  Filled 2012-01-17: qty 2

## 2012-01-17 MED ORDER — ATORVASTATIN CALCIUM 80 MG PO TABS
80.0000 mg | ORAL_TABLET | Freq: Every day | ORAL | Status: DC
  Filled 2012-01-17: qty 1

## 2012-01-17 MED ORDER — ALPRAZOLAM 0.5 MG PO TABS: 1.0000 mg | ORAL_TABLET | Freq: Two times a day (BID) | ORAL | Status: DC | PRN

## 2012-01-17 MED ORDER — NITROGLYCERIN 0.4 MG SL SUBL: 0.4000 mg | SUBLINGUAL_TABLET | SUBLINGUAL | Status: DC | PRN

## 2012-01-17 MED ORDER — FLUOXETINE HCL 20 MG PO CAPS
40.0000 mg | ORAL_CAPSULE | Freq: Every day | ORAL | Status: DC
  Filled 2012-01-17: qty 2

## 2012-01-17 MED ORDER — HYDROCHLOROTHIAZIDE 12.5 MG PO CAPS
12.5000 mg | ORAL_CAPSULE | Freq: Every day | ORAL | Status: DC
  Administered 2012-01-18: 12.5 mg via ORAL
  Filled 2012-01-17: qty 1

## 2012-01-17 MED ORDER — ONDANSETRON HCL 4 MG/2ML IJ SOLN: 4.0000 mg | Freq: Four times a day (QID) | INTRAMUSCULAR | Status: DC | PRN

## 2012-01-17 MED ORDER — ASPIRIN 300 MG RE SUPP
300.0000 mg | RECTAL | Status: AC
  Filled 2012-01-17: qty 1

## 2012-01-17 MED ORDER — METRONIDAZOLE 500 MG PO TABS: 500.0000 mg | ORAL_TABLET | Freq: Three times a day (TID) | ORAL | Status: DC

## 2012-01-17 MED ORDER — SODIUM CHLORIDE 0.9 % IJ SOLN: 3.0000 mL | INTRAMUSCULAR | Status: DC | PRN

## 2012-01-17 MED ORDER — IRBESARTAN 300 MG PO TABS
300.0000 mg | ORAL_TABLET | Freq: Every day | ORAL | Status: DC
  Administered 2012-01-18: 300 mg via ORAL
  Filled 2012-01-17: qty 1

## 2012-01-17 MED ORDER — ASPIRIN 325 MG PO TABS
325.0000 mg | ORAL_TABLET | Freq: Once | ORAL | Status: AC
  Administered 2012-01-17: 325 mg via ORAL

## 2012-01-17 MED ORDER — IBUPROFEN 800 MG PO TABS
800.0000 mg | ORAL_TABLET | Freq: Every day | ORAL | Status: DC
  Administered 2012-01-17: 800 mg via ORAL
  Filled 2012-01-17 (×2): qty 1

## 2012-01-17 MED ORDER — METRONIDAZOLE 500 MG PO TABS
500.0000 mg | ORAL_TABLET | Freq: Three times a day (TID) | ORAL | Status: DC
  Administered 2012-01-17 – 2012-01-18 (×2): 500 mg via ORAL
  Filled 2012-01-17 (×4): qty 1

## 2012-01-17 MED ORDER — CLOPIDOGREL BISULFATE 75 MG PO TABS
75.0000 mg | ORAL_TABLET | Freq: Every day | ORAL | Status: DC
  Administered 2012-01-18: 75 mg via ORAL
  Filled 2012-01-17: qty 1

## 2012-01-17 MED ORDER — SODIUM CHLORIDE 0.9 % IV SOLN
INTRAVENOUS | Status: DC
  Administered 2012-01-17 – 2012-01-18 (×3): via INTRAVENOUS

## 2012-01-17 MED ORDER — ENOXAPARIN SODIUM 100 MG/ML ~~LOC~~ SOLN
90.0000 mg | Freq: Two times a day (BID) | SUBCUTANEOUS | Status: DC
  Administered 2012-01-17: 90 mg via SUBCUTANEOUS
  Filled 2012-01-17 (×4): qty 1

## 2012-01-17 MED ORDER — ASPIRIN EC 81 MG PO TBEC
81.0000 mg | DELAYED_RELEASE_TABLET | Freq: Every day | ORAL | Status: DC
  Administered 2012-01-18: 81 mg via ORAL
  Filled 2012-01-17: qty 1

## 2012-01-17 MED ORDER — FLUOXETINE HCL 40 MG PO CAPS: 40.0000 mg | ORAL_CAPSULE | Freq: Every day | ORAL | Status: DC

## 2012-01-17 MED ORDER — ASPIRIN 81 MG PO CHEW
324.0000 mg | CHEWABLE_TABLET | Freq: Once | ORAL | Status: DC
  Administered 2012-01-17: 324 mg via ORAL

## 2012-01-17 MED ORDER — ASPIRIN 325 MG PO TABS: 325.0000 mg | ORAL_TABLET | Freq: Once | ORAL | Status: DC

## 2012-01-17 MED ORDER — ATORVASTATIN CALCIUM 80 MG PO TABS: 80.0000 mg | ORAL_TABLET | Freq: Every day | ORAL | Status: DC

## 2012-01-17 MED ORDER — METOPROLOL TARTRATE 25 MG PO TABS: 25.0000 mg | ORAL_TABLET | Freq: Two times a day (BID) | ORAL | Status: DC

## 2012-01-17 MED ORDER — FLUOXETINE HCL 20 MG PO CAPS
40.0000 mg | ORAL_CAPSULE | Freq: Every day | ORAL | Status: DC
  Administered 2012-01-17 – 2012-01-18 (×2): 40 mg via ORAL
  Filled 2012-01-17 (×2): qty 2

## 2012-01-17 MED ORDER — NITROGLYCERIN 0.4 MG SL SUBL: 0.4000 mg | SUBLINGUAL_TABLET | Freq: Once | SUBLINGUAL | Status: DC

## 2012-01-17 MED ORDER — SODIUM CHLORIDE 0.9 % IV SOLN: 250.0000 mL | INTRAVENOUS | Status: DC | PRN

## 2012-01-17 MED ORDER — SODIUM CHLORIDE 0.9 % IJ SOLN
3.0000 mL | Freq: Two times a day (BID) | INTRAMUSCULAR | Status: DC
  Administered 2012-01-17 – 2012-01-18 (×2): 3 mL via INTRAVENOUS

## 2012-01-17 MED ORDER — ACETAMINOPHEN 325 MG PO TABS: 650.0000 mg | ORAL_TABLET | ORAL | Status: DC | PRN

## 2012-01-17 NOTE — ED Notes (Signed)
Per [facility] pt from [facility] with c/o chest pain substernal, radiates to left arm and back. Hx of MI with stent placement. No other symptoms associated with chest pain. Pt given Nitro x 2 SL, Aspirin 324 mg. IV RAC 20G. Denies pain on arrival.

## 2012-01-17 NOTE — ED Notes (Signed)
Cardiac would like to walk around hallway and see if pt is having any pain.

## 2012-01-17 NOTE — ED Provider Notes (Signed)
Medical screening examination/treatment/procedure(s) were conducted as a shared visit with non-physician practitioner(s) and myself.  I personally evaluated the patient during the encounter On my exam the patient's pain had ceased, but during an attempt to exercise her about the ED, she became dyspneic with cp.  Given these Sx, she was admitted by Dr. Jacinto Halim for further E/M. On my exam the HR was 55 (SR) normal, pulse ox was 99% ra, normal and I also agree with the interpretation of the ECG.  Gerhard Munch, MD 01/17/12 2001

## 2012-01-17 NOTE — Progress Notes (Signed)
  Subjective:    Patient ID: Miranda Baker, female    DOB: 07-Jun-1961, 51 y.o.   MRN: [id number]  HPI  51 yo with history of MI and cardiac stent presented with 20 minutes of chest pain.   Patient is an employee of our office and walked to an exam room complaining of deep substernal chest pain radiating to her left arm.  States pain felt like previous MI.  Patient was given aspirin 325 mg and nitroglycerin 0.4 x 1.  Patients pain started to subside.  Pain recured and another NTG was given as well as oxygen placed with resolution of pain.  VSS, EKG shows NSR.   Will send patient straight to ER for further evaluation. Spoke with Dr. Joelene Millin who has agreed accept the patient.  Review of Systemssee HPI   Objective:   Physical Exam GEN: Alert & Oriented,  anxious appearing        Assessment & Plan:

## 2012-01-17 NOTE — Progress Notes (Signed)
ANTICOAGULATION CONSULT NOTE - Initial Consult  Pharmacy Consult for Lovenox Indication: USAP  Allergies  Allergen Reactions  . Erythromycin Other (See Comments)    Reaction unknown  . Morphine And Related Other (See Comments)    Reaction unknown  . Penicillins Other (See Comments)    Reaction unknown  . Tetracyclines & Related Other (See Comments)    Reaction unknown  . Vibramycin (Doxycycline Calcium) Other (See Comments)    Reaction unknown    Patient Measurements: Height: 5' 4.17" (163 cm) Weight: 199 lb 4.7 oz (90.4 kg) IBW/kg (Calculated) : 55.1   Vital Signs: Temp: 98.3 F (36.8 C) (06/14 1714) Temp src: Oral (06/14 1714) BP: 123/71 mmHg (06/14 1714) Pulse Rate: 59  (06/14 1714)  Labs:  Basename 01/17/12 1608 01/17/12 1505  HGB -- 12.6  HCT -- 37.9  PLT -- 293  APTT -- --  LABPROT -- --  INR -- --  HEPARINUNFRC -- --  CREATININE -- 0.91  CKTOTAL -- --  CKMB -- --  TROPONINI <0.30 --    Estimated Creatinine Clearance: 79 ml/min (by C-G formula based on Cr of 0.91).   Medical History: Past Medical History  Diagnosis Date  . MI (mitral incompetence)   . Coronary artery disease     Stent 2.75x15 Xience 2011  . Hypertension   . GERD (gastroesophageal reflux disease)   . Constipation   . Cellulitis     Medications:   (Not in a hospital admission)  Assessment: 51 y/o female patient admitted with chest pain requiring anticoagulation for r/o MI. Significant cardiac history including stent placement. EKG wnl, cardiac enzymes negative x1.   Goal of Therapy:  Anti-Xa level 0.6-1.2 units/ml 4hrs after LMWH dose given Monitor platelets by anticoagulation protocol: Yes   Plan:  Lovenox 1mg /kg q12h (90mg ) , cbc q72 , monitor renal function and s/s of bleeding.  Verlene Mayer, PharmD, BCPS Pager [phone] 01/17/2012,7:32 PM

## 2012-01-17 NOTE — ED Notes (Signed)
Secretary working on getting record from [facility] hospital and Dr. Bary Castilla, Cardiologist.

## 2012-01-17 NOTE — H&P (Signed)
Miranda Baker is an 51 y.o. female.   Chief Complaint: Chest pain  HPI: Ms. Miranda Baker is a 51 year old white female with history of known coronary artery disease and history of myocardial infarction while walking on a treadmill stress test 2 years ago. She has coronary artery disease and drug-eluting stent that was done approximately 2 years ago. Since then she's been doing well. Over the last 3-4 months she has noticed gradual onset and worsening dyspnea on exertion. She has attributed this to being obese and deconditioned. She's also noticed marked fatigue. She has not had any PND orthopnea and she has not used any nitroglycerin in the recent past. She was attending a graduation party today, then suddenly started having chest pressure number of the chest. She felt radiation to the back. This is very similar to angina when she had myocardial infarction and coronary stent placed. She denies palpitations, hemoptysis, hemoptysis, lower extremity edema, pain in her legs.   Past Medical History  Diagnosis Date  . MI (mitral incompetence)   . Coronary artery disease     Stent 2.75x15 Xience 2011  . Hypertension   . GERD (gastroesophageal reflux disease)   . Constipation   . Cellulitis      No family history on file. No family history of premature CAD, although her father has CAD in his 9 years of age. Both Paternal grandparents had premature CAD in their 43 years of age.  Social History:  reports that she has never smoked. She does not have any smokeless tobacco history on file. She reports that she does not drink alcohol or use illicit drugs.  Allergies:  Allergies  Allergen Reactions  . Erythromycin Other (See Comments)    Reaction unknown  . Morphine And Related Other (See Comments)    Reaction unknown  . Penicillins Other (See Comments)    Reaction unknown  . Tetracyclines & Related Other (See Comments)    Reaction unknown  . Vibramycin (Doxycycline Calcium) Other (See Comments)      Reaction unknown     (Not in a hospital admission)  Results for orders placed during the hospital encounter of 01/17/12 (from the past 48 hour(s))  CBC     Status: Normal   Collection Time   01/17/12  3:05 PM      Component Value Range Comment   WBC 7.2  4.0 - 10.5 K/uL    RBC 4.25  3.87 - 5.11 MIL/uL    Hemoglobin 12.6  12.0 - 15.0 g/dL    HCT 96.0  45.4 - 09.8 %    MCV 89.2  78.0 - 100.0 fL    MCH 29.6  26.0 - 34.0 pg    MCHC 33.2  30.0 - 36.0 g/dL    RDW 11.9  14.7 - 82.9 %    Platelets 293  150 - 400 K/uL   DIFFERENTIAL     Status: Normal   Collection Time   01/17/12  3:05 PM      Component Value Range Comment   Neutrophils Relative 50  43 - 77 %    Neutro Abs 3.6  1.7 - 7.7 K/uL    Lymphocytes Relative 38  12 - 46 %    Lymphs Abs 2.7  0.7 - 4.0 K/uL    Monocytes Relative 9  3 - 12 %    Monocytes Absolute 0.6  0.1 - 1.0 K/uL    Eosinophils Relative 3  0 - 5 %    Eosinophils Absolute  0.2  0.0 - 0.7 K/uL    Basophils Relative 1  0 - 1 %    Basophils Absolute 0.0  0.0 - 0.1 K/uL   BASIC METABOLIC PANEL     Status: Abnormal   Collection Time   01/17/12  3:05 PM      Component Value Range Comment   Sodium 139  135 - 145 mEq/L    Potassium 4.3  3.5 - 5.1 mEq/L    Chloride 102  96 - 112 mEq/L    CO2 28  19 - 32 mEq/L    Glucose, Bld 92  70 - 99 mg/dL    BUN 22  6 - 23 mg/dL    Creatinine, Ser 0.98  0.50 - 1.10 mg/dL    Calcium 11.9  8.4 - 10.5 mg/dL    GFR calc non Af Amer 71 (*) >90 mL/min    GFR calc Af Amer 83 (*) >90 mL/min   URINALYSIS, ROUTINE W REFLEX MICROSCOPIC     Status: Normal   Collection Time   01/17/12  3:22 PM      Component Value Range Comment   Color, Urine YELLOW  YELLOW    APPearance CLEAR  CLEAR    Specific Gravity, Urine 1.008  1.005 - 1.030    pH 6.0  5.0 - 8.0    Glucose, UA NEGATIVE  NEGATIVE mg/dL    Hgb urine dipstick NEGATIVE  NEGATIVE    Bilirubin Urine NEGATIVE  NEGATIVE    Ketones, ur NEGATIVE  NEGATIVE mg/dL    Protein, ur  NEGATIVE  NEGATIVE mg/dL    Urobilinogen, UA 0.2  0.0 - 1.0 mg/dL    Nitrite NEGATIVE  NEGATIVE    Leukocytes, UA NEGATIVE  NEGATIVE MICROSCOPIC NOT DONE ON URINES WITH NEGATIVE PROTEIN, BLOOD, LEUKOCYTES, NITRITE, OR GLUCOSE <1000 mg/dL.  TROPONIN I     Status: Normal   Collection Time   01/17/12  4:08 PM      Component Value Range Comment   Troponin I <0.30  <0.30 ng/mL    Dg Chest Port 1 View  01/17/2012  *RADIOLOGY REPORT*  Clinical Data: Left-sided chest pain radiating to the left arm.  PORTABLE CHEST - 1 VIEW  Comparison: 02/28/2010  Findings: Heart size is normal.  Mediastinal shadows are normal. Lungs are clear.  The vascularity is normal.  No effusions.  No bony abnormality.  IMPRESSION: No active disease  Original Report Authenticated By: Thomasenia Sales, M.D.    Review of Systems - General ROS: negative for - chills, fatigue, fever or sleep apnea. Has marked fatigue recently. Endocrine ROS: negative for - hot flashes, palpitations, polydipsia/polyuria, temperature intolerance or unexpected weight changes Gastrointestinal ROS: no abdominal pain, change in bowel habits, or black or bloody stools Neurological ROS: no TIA or stroke symptoms   Blood pressure 123/71, pulse 59, temperature 98.3 F (36.8 C), temperature source Oral, resp. rate 16, SpO2 100.00%. General appearance: alert, cooperative, appears stated age, no distress and moderately obese Eyes: conjunctivae/corneas clear. PERRL, EOM's intact. Fundi benign. Neck: no adenopathy, no carotid bruit, no JVD, supple, symmetrical, trachea midline and thyroid not enlarged, symmetric, no tenderness/mass/nodules Neck: JVP - normal, carotids 2+= without bruits Resp: clear to auscultation bilaterally Chest wall: no tenderness Cardio: regular rate and rhythm, S1, S2 normal, no murmur, click, rub or gallop GI: soft, non-tender; bowel sounds normal; no masses,  no organomegaly Extremities: extremities normal, atraumatic, no cyanosis or  edema Pulses: 2+ and symmetric Skin: Skin color, texture, turgor  normal. No rashes or lesions Neurologic: Alert and oriented X 3, normal strength and tone. Normal symmetric reflexes. Normal coordination and gait  EKG: Normal sinus rhythm at a rate of 62 beats a minute, normal axis, normal QT interval. There is nonspecific T-wave flattening in inferior leads unchanged from EKG done a year ago.  Assessment/Plan 1. Chest pain suggestive of unstable angina. Patient states that her chest pain was very similar to her angina when she had myocardial infarction while she was having a treadmill exercise stress test about 2 years ago and underwent urgent cardiac catheterization and angioplasty. Details not available to me. 2. Coronary artery disease status post PTCA and stenting of the coronary arteries with a 2.75 x 15 mm times drug-eluting stent approximately 2 years ago. 3. Hypertension controlled 4. Hyperlipidemia  Recommendations: Patient was essentially asymptomatic when I saw her and has not had anymore chest pain after she received 2 sublingual nitroglycerin and presented to the emergency department. I made her walk in the hallway prior to discharging her home. Patient started to notice recurrence of chest heaviness along with dyspnea. Hence patient was put back in the bed and is admitted to the hospital with unstable angina. I discussed with the patient and her husband who is present at the bedside regarding medical therapy, stress testing versus cardiac catheterization. They want to proceed with cardiac catheterization especially in view of the fact she has recently noticed worsening shortness of breath and dyspnea on exertion and has not had anymore angina since antiplastic. They understand less than a percent risk of that, stroke, and need for urgent bypass surgery, bleeding, infection but not limited to these as a complication. Obtain routine labs including lipid profile in the morning. Unless  recurrence of chest pain at rest cardiac catheterization will be performed on Monday.  Pamella Pert, MD 01/17/2012, 6:24 PM

## 2012-01-17 NOTE — Assessment & Plan Note (Signed)
Typical chest pain relieved with NTG in patient with history of CAD and stenting.  Will transfer to ER for further evaluation for ACS.

## 2012-01-17 NOTE — ED Notes (Addendum)
Per Pt, she was at work reading emails when she begin having left sided CP. Pain radiated to her left arm. No SOB, diaphoresis, or n/v. She did become lightheaded. Pt was given 2 Nitroglycerin and and 324 ASA PO.After that she did not have any additional CP. No cardiac or respiratory distress. Will continue to monitor.

## 2012-01-17 NOTE — ED Provider Notes (Signed)
History     CSN: [id number]  Arrival date & time 01/17/12  1451   None     Chief Complaint  Patient presents with  . Chest Pain    (Consider location/radiation/quality/duration/timing/severity/associated sxs/prior treatment) Patient is a 51 y.o. female presenting with chest pain.  Chest Pain Pertinent negatives for primary symptoms include no dizziness.  Pertinent negatives for associated symptoms include no numbness and no weakness.  Her past medical history is significant for anxiety/panic attacks, hyperlipidemia, hypertension and MI.  Pertinent negatives for past medical history include no aneurysm, no aortic dissection, no arrhythmia, no cancer, no COPD, no CHF, no diabetes, no DVT, no pacemaker, no PE, no rheumatic fever, no seizures, no sleep apnea, no strokes, no thyroid problem and no valve disorder.  Her family medical history is significant for CAD in family, heart disease in family, hyperlipidemia in family, hypertension in family and early MI in family.  Pertinent negatives for family medical history include: no diabetes in family, no PE in family, no sickle cell disease in family, no stroke in family and no sudden death in family.  Procedure history is positive for cardiac catheterization, echocardiogram, stress echo and exercise treadmill test.   25 -year-old female complaining of midsternal chest pain it radiates to her left upper extremity inner back. Patient has had an MI in the past with a stent 2 years ago on Christmas Day. She denies shortness of breath or nausea. Her cardiologist is in [facility]. 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 She received aspirin and 2 nitroglycerin with complete relief prior to arrival. States that the pain started at 2:30 and lasts for around 30 minutes. States that she took the first nitroglycerin without complete relief 30 minutes later the pain came back to the second nitroglycerin and got complete relief. Will  request records from [facility] Dr. Bary Castilla.  Patient is pain free presently.      Past Medical History  Diagnosis Date  . MI (mitral incompetence)   . Coronary artery disease   . Hypertension   . GERD (gastroesophageal reflux disease)   . Constipation   . Cellulitis     No past surgical history on file.  No family history on file.  History  Substance Use Topics  . Smoking status: Never Smoker   . Smokeless tobacco: Not on file  . Alcohol Use: No    OB History    Grav Para Term Preterm Abortions TAB SAB Ect Mult Living                  Review of Systems  Constitutional: Negative.   HENT: Negative.   Eyes: Negative.   Respiratory: Negative.   Cardiovascular: Positive for chest pain. Negative for leg swelling.  Gastrointestinal: Negative.   Genitourinary: Negative.   Musculoskeletal: Negative.   Skin: Negative.   Neurological: Negative.  Negative for dizziness, seizures, syncope, weakness and numbness.  Psychiatric/Behavioral: Negative.   All other systems reviewed and are negative.    Allergies  Erythromycin; Morphine and related; Penicillins; Tetracyclines & related; and Vibramycin  Home Medications   Current Outpatient Rx  Name Route Sig Dispense Refill  . ALPRAZOLAM 1 MG PO TABS Oral Take 1 mg by mouth 2 (two) times daily as needed. For anexity    . ATORVASTATIN CALCIUM 80 MG PO TABS Oral Take 80 mg by mouth daily.    Marland Kitchen CLOPIDOGREL BISULFATE 75 MG PO TABS Oral Take 75 mg by mouth daily.      Marland Kitchen  FLUOXETINE HCL 40 MG PO CAPS Oral Take 40 mg by mouth daily.    . IBUPROFEN 200 MG PO TABS Oral Take 800 mg by mouth at bedtime.    Marland Kitchen METOPROLOL TARTRATE 25 MG PO TABS Oral Take 25 mg by mouth 2 (two) times daily.    Marland Kitchen METRONIDAZOLE 500 MG PO TABS Oral Take 500 mg by mouth 3 (three) times daily.    Marland Kitchen OLMESARTAN MEDOXOMIL-HCTZ 40-12.5 MG PO TABS Oral Take 1 tablet by mouth daily.    Marland Kitchen FLUOXETINE HCL 40 MG PO CAPS Oral Take 1 capsule (40 mg total) by mouth daily. 90  capsule 3    There were no vitals taken for this visit.  Physical Exam  Nursing note and vitals reviewed. Constitutional: She is oriented to person, place, and time. She appears well-developed and well-nourished.  HENT:  Head: Normocephalic and atraumatic.  Eyes: Conjunctivae and EOM are normal. Pupils are equal, round, and reactive to light.  Neck: Normal range of motion. Neck supple.  Cardiovascular: Normal rate, regular rhythm, normal heart sounds and intact distal pulses.  Exam reveals no gallop and no friction rub.   No murmur heard. Pulmonary/Chest: Effort normal and breath sounds normal.  Abdominal: Soft. Bowel sounds are normal.  Musculoskeletal: Normal range of motion. She exhibits no edema and no tenderness.  Neurological: She is alert and oriented to person, place, and time. She has normal reflexes.  Skin: Skin is warm and dry.  Psychiatric: She has a normal mood and affect.    ED Course  Procedures (including critical care time) 4:43 PM No further chest pain in the ER.  Dr Jacinto Halim Will see her and admit.    Labs Reviewed  CBC  DIFFERENTIAL  BASIC METABOLIC PANEL  URINALYSIS, ROUTINE W REFLEX MICROSCOPIC   No results found.   No diagnosis found.    MDM   Date: 01/17/2012  Rate: 52  Rhythm: sinus bradycardia  QRS Axis: normal  Intervals: normal  ST/T Wave abnormalities: normal  Conduction Disutrbances:none  Narrative Interpretation:   Old EKG Reviewed: unchanged  Midsternal cp radiating through to back and LUE.  Chest pain relieved with ntg x 2.  Pain free now.  Negative troponin and unchanged EKG.  Prior MI and stent 2 years ago.  Cardiologist Dr. Bary Castilla in [place].  Records requested.  Dr. Jacinto Halim to see and admit.        Remi Haggard, NP 01/17/12 1655

## 2012-01-17 NOTE — ED Notes (Signed)
Pt ambulated in hall and begin having CP. Dr. Jeraldine Loots and Cardiologist made aware.

## 2012-01-18 LAB — CARDIAC PANEL(CRET KIN+CKTOT+MB+TROPI)
CK, MB: 2.1 ng/mL (ref 0.3–4.0)
Relative Index: 2.1 (ref 0.0–2.5)
Relative Index: INVALID (ref 0.0–2.5)
Total CK: 102 U/L (ref 7–177)
Total CK: 94 U/L (ref 7–177)

## 2012-01-18 LAB — CBC
Hemoglobin: 12.1 g/dL (ref 12.0–15.0)
MCH: 29.4 pg (ref 26.0–34.0)
MCHC: 33.3 g/dL (ref 30.0–36.0)
RDW: 12.7 % (ref 11.5–15.5)

## 2012-01-18 LAB — LIPID PANEL
HDL: 47 mg/dL (ref >39)
LDL Cholesterol: 70 mg/dL (ref 0–99)
Total CHOL/HDL Ratio: 3.2 RATIO
VLDL: 33 mg/dL (ref 0–40)

## 2012-01-18 LAB — BASIC METABOLIC PANEL
BUN: 22 mg/dL (ref 6–23)
CO2: 24 mEq/L (ref 19–32)
Chloride: 106 mEq/L (ref 96–112)
Glucose, Bld: 90 mg/dL (ref 70–99)
Potassium: 3.9 mEq/L (ref 3.5–5.1)

## 2012-01-18 MED ORDER — NITROGLYCERIN 0.4 MG SL SUBL: 0.4000 mg | SUBLINGUAL_TABLET | SUBLINGUAL | Status: DC | PRN

## 2012-01-18 MED ORDER — ASPIRIN 81 MG PO TBEC: 81.0000 mg | DELAYED_RELEASE_TABLET | Freq: Every day | ORAL | Status: DC

## 2012-01-18 NOTE — Progress Notes (Signed)
Client verbalized understanding of discharge instructions.  She was encouraged to call [phone] if needed for occurrence of chest pain.  Client was given a prescription for nitroglycerin sl prn and verbalizes understanding on how to use it.

## 2012-01-18 NOTE — Discharge Instructions (Signed)
Your recommended not to return to work until cardiac evaluation is complete. Your required to call me if you have recurrence of chest pain. Do not drive or swim until cardiac evaluation is complete.

## 2012-01-18 NOTE — Progress Notes (Signed)
PHARMACIST - PHYSICIAN COMMUNICATION DR:   Jacinto Halim CONCERNING:  Decreased effectiveness of Plavix 2/2 CYP2C19 interaction with Prozac   RECOMMENDATION: Please consider changing Prozac to another SSRI agent (prozac and Luvox CR are the main SSRI that interacts with plavix)  DESCRIPTION: The combination of plavix with a CYP2C19 inhibitor such as Prozac can result in decreased effectiveness of Plavix and another agent should be initiated if possible.   Thank you! Leory Plowman, PharmD. [phone]

## 2012-01-18 NOTE — Progress Notes (Signed)
Subjective:  Doing well and remains asymptomatic.  Objective:  Vital Signs in the last 24 hours: Temp:  [97.7 F (36.5 C)-98.5 F (36.9 C)] 98.5 F (36.9 C) (06/15 0927) Pulse Rate:  [55-83] 83  (06/15 0927) Resp:  [16-20] 20  (06/15 0927) BP: (97-124)/(51-78) 109/66 mmHg (06/15 0927) SpO2:  [98 %-100 %] 98 % (06/15 0927) Weight:  [90 kg (198 lb 6.6 oz)-90.4 kg (199 lb 4.7 oz)] 90.084 kg (198 lb 9.6 oz) (06/15 0555)  Intake/Output from previous day: 06/14 0701 - 06/15 0700 In: -  Out: 450 [Urine:450]  Physical Exam:   General appearance: alert, cooperative, appears stated age and no distress Eyes: conjunctivae/corneas clear. PERRL, EOM's intact. Fundi benign. Neck: no adenopathy, no carotid bruit, no JVD, supple, symmetrical, trachea midline and thyroid not enlarged, symmetric, no tenderness/mass/nodules Neck: JVP - normal, carotids 2+= without bruits Resp: clear to auscultation bilaterally Chest wall: no tenderness Cardio: regular rate and rhythm, S1, S2 normal, no murmur, click, rub or gallop GI: soft, non-tender; bowel sounds normal; no masses,  no organomegaly Extremities: extremities normal, atraumatic, no cyanosis or edema    Lab Results:  Basename 01/18/12 0234 01/17/12 1505  WBC 7.2 7.2  HGB 12.1 12.6  PLT 235 293    Basename 01/18/12 0234 01/17/12 1505  NA 141 139  K 3.9 4.3  CL 106 102  CO2 24 28  GLUCOSE 90 92  BUN 22 22  CREATININE 1.13* 0.91    Basename 01/18/12 0840 01/18/12 0234  TROPONINI <0.30 <0.30    Cardiac Studies: Will repeat EKG.  Assessment/Plan:  1. Unstable angina pectoris. MI ruled out. H/O CAD S/P Stenting with DES 1.5 years ago. 2. Hyperglycemia with HbA1c 5.8. 3. Hyperlipidemia. Lipids pending.  Plan: Saline lock IV. Continue Lovenox. Cardiac cath Monday. Husband present at bedside and all questions answered. Discussed [place] and probably not safe to discharge home inspite of negative cardiac enzymes. Governor Matos,JAGADEESH R,  M.D. 01/18/2012, 10:44 AM  

## 2012-01-18 NOTE — Discharge Summary (Addendum)
Physician Discharge Summary  Patient ID: VERNELL BACK MRN: [id number] DOB/AGE: 01-14-1961 51 y.o.  Admit date: 01/17/2012 Discharge date: 01/18/2012  Primary Discharge Diagnosis Unstable angina pectoris.  Secondary Discharge Diagnosis CAD Hypertensin Hyperlipidemia  Significant Diagnostic Studies: None  Consults:   Hospital Course: Patient presented to the emergency department with chest pain. I was going to discharge her of the evaluation was complete and cardiac markers were negative x2 however on walking she had reproducible chest pain with shortness of breath. Hence I admitted her with a diagnosis of unstable angina pectoris. I rounded on this is morning and discuss with her regarding unstable angina which can stabilize with medical therapy however I was not very convinced that it is safe for her to go home. Initially she decided to stay but then requested me to discharge her. Husband is present the bedside and they are aware of the risks associated with this. I will try to set her up for any outpatient cardiac catheterization on probably Tuesday or Wednesday the pain will schedule and she is advised not to return to work until cardiac evaluation is complete.   Discharge Exam: Blood pressure 109/66, pulse 83, temperature 98.5 F (36.9 C), temperature source Oral, resp. rate 20, height 5\' 4"  (1.626 m), weight 90.084 kg (198 lb 9.6 oz), SpO2 98.00%.   General appearance: alert, cooperative, appears stated age and no distress  Eyes: conjunctivae/corneas clear. PERRL, EOM's intact. Fundi benign.  Neck: no adenopathy, no carotid bruit, no JVD, supple, symmetrical, trachea midline and thyroid not enlarged, symmetric, no tenderness/mass/nodules  Neck: JVP - normal, carotids 2+= without bruits  Resp: clear to auscultation bilaterally  Chest wall: no tenderness  Cardio: regular rate and rhythm, S1, S2 normal, no murmur, click, rub or gallop  GI: soft, non-tender; bowel sounds normal; no  masses, no organomegaly  Extremities: extremities normal, atraumatic, no cyanosis or edema    Lab Results  Component Value Date   WBC 7.2 01/18/2012   HGB 12.1 01/18/2012   HCT 36.3 01/18/2012   MCV 88.3 01/18/2012   PLT 235 01/18/2012    Lab 01/18/12 0234  NA 141  K 3.9  CL 106  CO2 24  BUN 22  CREATININE 1.13*  CALCIUM 9.5  PROT --  BILITOT --  ALKPHOS --  ALT --  AST --  GLUCOSE 90   Lab Results  Component Value Date   CKTOTAL 94 01/18/2012   CKMB 1.6 01/18/2012   TROPONINI <0.30 01/18/2012    Lipid Panel     Component Value Date/Time   CHOL 150 01/18/2012 0234   TRIG 163* 01/18/2012 0234   HDL 47 01/18/2012 0234   CHOLHDL 3.2 01/18/2012 0234   VLDL 33 01/18/2012 0234   LDLCALC 70 01/18/2012 0234       Radiology: Dg Chest Port 1 View  01/17/2012  *RADIOLOGY REPORT*  Clinical Data: Left-sided chest pain radiating to the left arm.  PORTABLE CHEST - 1 VIEW  Comparison: 02/28/2010  Findings: Heart size is normal.  Mediastinal shadows are normal. Lungs are clear.  The vascularity is normal.  No effusions.  No bony abnormality.  IMPRESSION: No active disease  Original Report Authenticated By: Thomasenia Sales, M.D.    EKG: 01/17/12 EKG: Normal sinus rhythm at a rate of 62 beats a minute, normal axis, normal QT interval. There is nonspecific T-wave flattening in inferior leads unchanged from EKG done a year ago.   FOLLOW UP PLANS AND APPOINTMENTS Patient will call Monday to set  up cardiac cath as out patient basis. Not to return to work until cleared and to call me if recurrence of chest pain.  Medication List  As of 01/18/2012 11:13 AM   STOP taking these medications         metroNIDAZOLE 500 MG tablet         TAKE these medications         ALPRAZolam 1 MG tablet   Commonly known as: XANAX   Take 1 mg by mouth 2 (two) times daily as needed. For anexity      aspirin 81 MG EC tablet   Take 1 tablet (81 mg total) by mouth daily.      atorvastatin 80 MG tablet    Commonly known as: LIPITOR   Take 80 mg by mouth daily.      FLUoxetine 40 MG capsule   Commonly known as: PROZAC   Take 1 capsule (40 mg total) by mouth daily.      FLUoxetine 40 MG capsule   Commonly known as: PROZAC   Take 40 mg by mouth daily.      ibuprofen 200 MG tablet   Commonly known as: ADVIL,MOTRIN   Take 800 mg by mouth at bedtime.      metoprolol tartrate 25 MG tablet   Commonly known as: LOPRESSOR   Take 25 mg by mouth 2 (two) times daily.      nitroGLYCERIN 0.4 MG SL tablet   Commonly known as: NITROSTAT   Place 1 tablet (0.4 mg total) under the tongue every 5 (five) minutes x 3 doses as needed for chest pain.      olmesartan-hydrochlorothiazide 40-12.5 MG per tablet   Commonly known as: BENICAR HCT   Take 1 tablet by mouth daily.      PLAVIX 75 MG tablet   Generic drug: clopidogrel   Take 75 mg by mouth daily.              Pamella Pert, MD 01/18/2012, 11:01 AM

## 2012-01-21 ENCOUNTER — Ambulatory Visit (HOSPITAL_COMMUNITY)
Admission: AD | Admit: 2012-01-21 | Discharge: 2012-01-21 | Disposition: A | Discharge status: Home / Self Care | Payer: 59 | Source: Ambulatory Visit | Attending: Cardiology | Admitting: Cardiology

## 2012-01-21 ENCOUNTER — Encounter (HOSPITAL_COMMUNITY)
Admission: AD | Disposition: A | Discharge status: Home / Self Care | Payer: Self-pay | Source: Ambulatory Visit | Attending: Cardiology

## 2012-01-21 ENCOUNTER — Other Ambulatory Visit: Payer: Self-pay | Admitting: Family Medicine

## 2012-01-21 ENCOUNTER — Encounter: Payer: Self-pay | Admitting: Family Medicine

## 2012-01-21 DIAGNOSIS — I2510 Atherosclerotic heart disease of native coronary artery without angina pectoris: Secondary | ICD-10-CM | POA: Insufficient documentation

## 2012-01-21 DIAGNOSIS — I10 Essential (primary) hypertension: Secondary | ICD-10-CM | POA: Insufficient documentation

## 2012-01-21 DIAGNOSIS — R079 Chest pain, unspecified: Secondary | ICD-10-CM | POA: Insufficient documentation

## 2012-01-21 DIAGNOSIS — Z9861 Coronary angioplasty status: Secondary | ICD-10-CM

## 2012-01-21 DIAGNOSIS — E785 Hyperlipidemia, unspecified: Secondary | ICD-10-CM | POA: Insufficient documentation

## 2012-01-21 HISTORY — PX: LEFT HEART CATHETERIZATION WITH CORONARY ANGIOGRAM: SHX5451

## 2012-01-21 LAB — CBC
MCH: 29.3 pg (ref 26.0–34.0)
MCV: 88.3 fL (ref 78.0–100.0)
Platelets: 274 10*3/uL (ref 150–400)
RBC: 4.44 MIL/uL (ref 3.87–5.11)

## 2012-01-21 LAB — BASIC METABOLIC PANEL
BUN: 23 mg/dL (ref 6–23)
CO2: 24 mEq/L (ref 19–32)
Calcium: 10.1 mg/dL (ref 8.4–10.5)
Creatinine, Ser: 0.9 mg/dL (ref 0.50–1.10)
Glucose, Bld: 97 mg/dL (ref 70–99)

## 2012-01-21 LAB — PROTIME-INR: Prothrombin Time: 13.4 seconds (ref 11.6–15.2)

## 2012-01-21 SURGERY — LEFT HEART CATHETERIZATION WITH CORONARY ANGIOGRAM
Anesthesia: LOCAL

## 2012-01-21 MED ORDER — ACETAMINOPHEN 325 MG PO TABS: 650.0000 mg | ORAL_TABLET | ORAL | Status: DC | PRN

## 2012-01-21 MED ORDER — HEPARIN (PORCINE) IN NACL 2-0.9 UNIT/ML-% IJ SOLN
INTRAMUSCULAR | Status: AC
  Filled 2012-01-21: qty 2000

## 2012-01-21 MED ORDER — HYDROMORPHONE HCL PF 1 MG/ML IJ SOLN
INTRAMUSCULAR | Status: AC
  Filled 2012-01-21: qty 1

## 2012-01-21 MED ORDER — MIDAZOLAM HCL 2 MG/2ML IJ SOLN
INTRAMUSCULAR | Status: AC
  Filled 2012-01-21: qty 2

## 2012-01-21 MED ORDER — LIDOCAINE HCL (PF) 1 % IJ SOLN
INTRAMUSCULAR | Status: AC
  Filled 2012-01-21: qty 30

## 2012-01-21 MED ORDER — SODIUM CHLORIDE 0.9 % IV SOLN: 250.0000 mL | INTRAVENOUS | Status: DC | PRN

## 2012-01-21 MED ORDER — CITALOPRAM HYDROBROMIDE 40 MG PO TABS: 40.0000 mg | ORAL_TABLET | Freq: Every day | ORAL | Status: DC

## 2012-01-21 MED ORDER — VERAPAMIL HCL 2.5 MG/ML IV SOLN
INTRAVENOUS | Status: AC
  Filled 2012-01-21: qty 2

## 2012-01-21 MED ORDER — ONDANSETRON HCL 4 MG/2ML IJ SOLN: 4.0000 mg | Freq: Four times a day (QID) | INTRAMUSCULAR | Status: DC | PRN

## 2012-01-21 MED ORDER — NITROGLYCERIN 0.2 MG/ML ON CALL CATH LAB
INTRAVENOUS | Status: AC
  Filled 2012-01-21: qty 1

## 2012-01-21 MED ORDER — SODIUM CHLORIDE 0.9 % IJ SOLN: 3.0000 mL | Freq: Two times a day (BID) | INTRAMUSCULAR | Status: DC

## 2012-01-21 MED ORDER — HYDROMORPHONE HCL PF 2 MG/ML IJ SOLN
INTRAMUSCULAR | Status: AC
  Administered 2012-01-21: 0.5 mg
  Filled 2012-01-21: qty 1

## 2012-01-21 MED ORDER — SODIUM CHLORIDE 0.9 % IV SOLN
INTRAVENOUS | Status: DC
  Administered 2012-01-21: 11:00:00 via INTRAVENOUS

## 2012-01-21 MED ORDER — SODIUM CHLORIDE 0.9 % IV SOLN: 1.0000 mL/kg/h | INTRAVENOUS | Status: DC

## 2012-01-21 MED ORDER — SODIUM CHLORIDE 0.9 % IJ SOLN: 3.0000 mL | INTRAMUSCULAR | Status: DC | PRN

## 2012-01-21 MED ORDER — DIAZEPAM 5 MG PO TABS
5.0000 mg | ORAL_TABLET | ORAL | Status: AC
  Administered 2012-01-21: 5 mg via ORAL
  Filled 2012-01-21: qty 1

## 2012-01-21 MED ORDER — HEPARIN SODIUM (PORCINE) 1000 UNIT/ML IJ SOLN
INTRAMUSCULAR | Status: AC
  Filled 2012-01-21: qty 1

## 2012-01-21 MED ORDER — HYDROMORPHONE HCL PF 2 MG/ML IJ SOLN: 0.5000 mg | Freq: Once | INTRAMUSCULAR | Status: DC

## 2012-01-21 NOTE — H&P (View-Only) (Signed)
Subjective:  Doing well and remains asymptomatic.  Objective:  Vital Signs in the last 24 hours: Temp:  [97.7 F (36.5 C)-98.5 F (36.9 C)] 98.5 F (36.9 C) (06/15 0927) Pulse Rate:  [55-83] 83  (06/15 0927) Resp:  [16-20] 20  (06/15 0927) BP: (97-124)/(51-78) 109/66 mmHg (06/15 0927) SpO2:  [98 %-100 %] 98 % (06/15 0927) Weight:  [90 kg (198 lb 6.6 oz)-90.4 kg (199 lb 4.7 oz)] 90.084 kg (198 lb 9.6 oz) (06/15 0555)  Intake/Output from previous day: 06/14 0701 - 06/15 0700 In: -  Out: 450 [Urine:450]  Physical Exam:   General appearance: alert, cooperative, appears stated age and no distress Eyes: conjunctivae/corneas clear. PERRL, EOM's intact. Fundi benign. Neck: no adenopathy, no carotid bruit, no JVD, supple, symmetrical, trachea midline and thyroid not enlarged, symmetric, no tenderness/mass/nodules Neck: JVP - normal, carotids 2+= without bruits Resp: clear to auscultation bilaterally Chest wall: no tenderness Cardio: regular rate and rhythm, S1, S2 normal, no murmur, click, rub or gallop GI: soft, non-tender; bowel sounds normal; no masses,  no organomegaly Extremities: extremities normal, atraumatic, no cyanosis or edema    Lab Results:  Basename 01/18/12 0234 01/17/12 1505  WBC 7.2 7.2  HGB 12.1 12.6  PLT 235 293    Basename 01/18/12 0234 01/17/12 1505  NA 141 139  K 3.9 4.3  CL 106 102  CO2 24 28  GLUCOSE 90 92  BUN 22 22  CREATININE 1.13* 0.91    Basename 01/18/12 0840 01/18/12 0234  TROPONINI <0.30 <0.30    Cardiac Studies: Will repeat EKG.  Assessment/Plan:  1. Unstable angina pectoris. MI ruled out. H/O CAD S/P Stenting with DES 1.5 years ago. 2. Hyperglycemia with HbA1c 5.8. 3. Hyperlipidemia. Lipids pending.  Plan: Saline lock IV. Continue Lovenox. Cardiac cath Monday. Husband present at bedside and all questions answered. Discussed [place] and probably not safe to discharge home inspite of negative cardiac enzymes. Pamella Pert,  M.D. 01/18/2012, 10:44 AM

## 2012-01-21 NOTE — Interval H&P Note (Signed)
History and Physical Interval Note:  01/21/2012 10:27 AM  Miranda Baker  has presented today for surgery, with the diagnosis of Chest pain  The various methods of treatment have been discussed with the patient and family. After consideration of risks, benefits and other options for treatment, the patient has consented to  Procedure(s) (LRB): LEFT HEART CATHETERIZATION WITH CORONARY ANGIOGRAM (N/A) as a surgical intervention .  The patient's history has been reviewed, patient examined, no change in status, stable for surgery.  I have reviewed the patients' chart and labs.  Questions were answered to the patient's satisfaction.     Pamella Pert

## 2012-01-21 NOTE — Discharge Instructions (Signed)
Return to work on 01/27/2012. Try to restrict using her right arm for the next 24-48 hours. Resume activities slowly. Office visit with Dr. Jacinto Halim will be set up in 2 weeks.Radial Site Care Refer to this sheet in the next few weeks. These instructions provide you with information on caring for yourself after your procedure. Your caregiver may also give you more specific instructions. Your treatment has been planned according to current medical practices, but problems sometimes occur. Call your caregiver if you have any problems or questions after your procedure. HOME CARE INSTRUCTIONS  You may shower the day after the procedure.Remove the bandage (dressing) and gently wash the site with plain soap and water.Gently pat the site dry.   Do not apply powder or lotion to the site.   Do not submerge the affected site in water for 3 to 5 days.   Inspect the site at least twice daily.   Do not flex or bend the affected arm for 24 hours.   No lifting over 5 pounds (2.3 kg) for 5 days after your procedure.   Do not drive home if you are discharged the same day of the procedure. Have someone else drive you.   You may drive 24 hours after the procedure unless otherwise instructed by your caregiver.   Do not operate machinery or power tools for 24 hours.   A responsible adult should be with you for the first 24 hours after you arrive home.  What to expect:  Any bruising will usually fade within 1 to 2 weeks.   Blood that collects in the tissue (hematoma) may be painful to the touch. It should usually decrease in size and tenderness within 1 to 2 weeks.  SEEK IMMEDIATE MEDICAL CARE IF:  You have unusual pain at the radial site.   You have redness, warmth, swelling, or pain at the radial site.   You have drainage (other than a small amount of blood on the dressing).   You have chills.   You have a fever or persistent symptoms for more than 72 hours.   You have a fever and your symptoms  suddenly get worse.   Your arm becomes pale, cool, tingly, or numb.   You have heavy bleeding from the site. Hold pressure on the site.  Document Released: 08/24/2010 Document Revised: 07/11/2011 Document Reviewed: 08/24/2010 [facility] Patient Information 2012 [place], [place].

## 2012-01-21 NOTE — CV Procedure (Signed)
Procedure performed:  Left heart catheterization including hemodynamic monitoring of the left ventricle, LV gram. Slective right and left coronary arteriography.  Indication patient is a 51 year-old woman with history of hypertension,  hyperlipidemia, CAD and H/O Stenting to the coronary artery about 1.5 years ago at [facility]. She recently admitted to [facility] via emergency department with symptoms suggestive of unstable angina. She was ruled out for myocardial infarction and was discharged home the following day. She's now brought for elective coronary angiography to evaluate her coronary anatomy. Patient has had mild chest discomfort since discharge but had not used any sublingual nitroglycerin. Hence is brought to the cardiac catheterization lab to evaluate her coronary anatomy for definitive diagnosis of CAD.  Hemodynamic data:  Left ventricular pressure was 113/58 with LVEDP of 6 mm mercury. Aortic pressure was 114/70 with a mean of 91 mm mercury. There was no pressure gradient across the aortic valve.  Left ventricle: Performed in the RAO projection revealed LVEF of 60 %. There was no significant MR. No Wall motion abnormality.  Right coronary artery: The vessel is smooth, normal,  Dominant.  Left main coronary artery is large and normal.  Circumflex coronary artery: A large vessel giving origin to a large obtuse marginal 1.   LAD:  LAD gives origin to a large diagonal-1. Diagonal one is very large and he is equivalent to the LAD. LAD has mild luminal irregularities. There is a stent in the proximal portion of the D1 which is widely patent. The ostium of the D1 and the LAD just after the D1 origin show 20% stenoses. Mid LAD shows mild luminal irregularity.  Impression: I suspect the chest pain is probably related to coronary spasm. I do not see any unstable plaque. She can safely be discharged home today with outpatient followup. She'll need continued risk  modification.  Technique: Under sterile precautions using a 6 French right radial  arterial access, a 6 French sheath was introduced into the right radial artery. A 5 [place] Tig 4 catheter was advanced into the ascending aorta selective  right coronary artery and left coronary artery was cannulated and angiography was performed in multiple views. The catheter was pulled back Out of the body over exchange length J-wire.  Same catheter was used to perform LV gram which was performed in LAO projection.  Catheter exchanged out of the body over J-Wire. NO immediate complications noted. Patient tolerated the procedure well.   Rec: Medical therapy with aggressive risk factor reduction.   Disposition: Will be discharged home today with outpatient follow up.

## 2012-02-05 ENCOUNTER — Other Ambulatory Visit: Payer: Self-pay | Admitting: Family Medicine

## 2012-02-05 MED ORDER — PANTOPRAZOLE SODIUM 40 MG PO TBEC: 40.0000 mg | DELAYED_RELEASE_TABLET | Freq: Every day | ORAL | Status: DC

## 2012-03-04 ENCOUNTER — Other Ambulatory Visit: Payer: Self-pay | Admitting: Family Medicine

## 2012-03-04 MED ORDER — OLMESARTAN MEDOXOMIL-HCTZ 40-12.5 MG PO TABS: 1.0000 | ORAL_TABLET | Freq: Every day | ORAL | Status: DC

## 2012-03-04 MED ORDER — CLOPIDOGREL BISULFATE 75 MG PO TABS: 75.0000 mg | ORAL_TABLET | Freq: Every day | ORAL | Status: DC

## 2012-03-04 MED ORDER — METOPROLOL TARTRATE 25 MG PO TABS: 25.0000 mg | ORAL_TABLET | Freq: Two times a day (BID) | ORAL | Status: DC

## 2012-03-04 NOTE — Progress Notes (Signed)
Fax to [facility] (new mail order) [phone]

## 2012-03-31 ENCOUNTER — Other Ambulatory Visit: Payer: Self-pay | Admitting: Family Medicine

## 2012-03-31 DIAGNOSIS — L814 Other melanin hyperpigmentation: Secondary | ICD-10-CM

## 2012-03-31 MED ORDER — MONTELUKAST SODIUM 10 MG PO TABS: 10.0000 mg | ORAL_TABLET | Freq: Every day | ORAL | Status: DC

## 2012-04-15 ENCOUNTER — Other Ambulatory Visit: Payer: Self-pay | Admitting: Family Medicine

## 2012-04-15 MED ORDER — ATORVASTATIN CALCIUM 80 MG PO TABS: 80.0000 mg | ORAL_TABLET | Freq: Every day | ORAL | Status: DC

## 2012-04-29 ENCOUNTER — Other Ambulatory Visit: Payer: Self-pay | Admitting: Family Medicine

## 2012-04-29 MED ORDER — ATORVASTATIN CALCIUM 80 MG PO TABS: 80.0000 mg | ORAL_TABLET | Freq: Every day | ORAL | Status: DC

## 2012-05-14 ENCOUNTER — Ambulatory Visit (INDEPENDENT_AMBULATORY_CARE_PROVIDER_SITE_OTHER): Payer: 59 | Admitting: Family Medicine

## 2012-05-14 VITALS — BP 110/57 | HR 57 | Temp 98.6°F

## 2012-05-14 DIAGNOSIS — F341 Dysthymic disorder: Secondary | ICD-10-CM

## 2012-05-14 DIAGNOSIS — I10 Essential (primary) hypertension: Secondary | ICD-10-CM

## 2012-05-14 DIAGNOSIS — IMO0001 Reserved for inherently not codable concepts without codable children: Secondary | ICD-10-CM

## 2012-05-14 DIAGNOSIS — R413 Other amnesia: Secondary | ICD-10-CM

## 2012-05-14 LAB — CBC WITH DIFFERENTIAL/PLATELET
Basophils Relative: 1 % (ref 0–1)
Eosinophils Relative: 3 % (ref 0–5)
HCT: 39 % (ref 36.0–46.0)
Hemoglobin: 12.9 g/dL (ref 12.0–15.0)
Lymphocytes Relative: 34 % (ref 12–46)
MCHC: 33.1 g/dL (ref 30.0–36.0)
MCV: 86.3 fL (ref 78.0–100.0)
Monocytes Absolute: 0.5 10*3/uL (ref 0.1–1.0)
Monocytes Relative: 6 % (ref 3–12)
Neutro Abs: 4.8 10*3/uL (ref 1.7–7.7)

## 2012-05-14 LAB — COMPREHENSIVE METABOLIC PANEL
Albumin: 4.7 g/dL (ref 3.5–5.2)
BUN: 23 mg/dL (ref 6–23)
Calcium: 11.1 mg/dL — ABNORMAL HIGH (ref 8.4–10.5)
Chloride: 102 mEq/L (ref 96–112)
Glucose, Bld: 74 mg/dL (ref 70–99)
Potassium: 4.2 mEq/L (ref 3.5–5.3)

## 2012-05-14 LAB — SEDIMENTATION RATE: Sed Rate: 4 mm/hr (ref 0–22)

## 2012-05-14 LAB — LDL CHOLESTEROL, DIRECT: Direct LDL: 90 mg/dL

## 2012-05-14 LAB — TSH: TSH: 2.664 u[IU]/mL (ref 0.350–4.500)

## 2012-05-14 MED ORDER — OLMESARTAN MEDOXOMIL 20 MG PO TABS: 20.0000 mg | ORAL_TABLET | Freq: Every day | ORAL | Status: DC

## 2012-05-15 NOTE — Progress Notes (Signed)
Subjective:    Patient ID: Miranda Baker, female    DOB: 28-Dec-1960, 51 y.o.   MRN: [id number]  HPI Recent episodes of memory loss. Her family is quite concerned. Was on vacation with her family, rented a car and they drove to see other family members. Spent teh day there and returned to vacationing. This happened 2 months ago. Last night her family was discussing that part of the trip and she had no memory of it. She does not remember renting the car, making the drive (she was both the renter and the driver), nor does she remember any events of the day. She thought her family was playing a joke on her initially. Reports tehe have been other instances similar, but none with such a long blank space in her memory. She is worried because she says it is different from just 'forgetting" something and then having your memory triggered and remembering part of it---these episodes are accompanied by a total blank space in her memory.  Also continued issues with leg pains at nihht--we have discussed these before. She continues to have those but now is having similar pains in her upper extremity. Both start about 1 hour after sleep is initiated, awaken her---last for 1 or more hours. Keep her from falling back to sleep.  She is also now having some of same type of upper extremity pain during the day.  Continued significant stressors---husband on transplant list for end stage pulmonary fibrosis, they had to sell their house, he is in different (less well paying )job.  Son is in marriage with difficulties---she is very close to her two grandchildren (son's kids) and feels stress re their family issues. Work is stressful. Has been trying to exercise every day--walking or going to gym--30 minutes. Has joined weight watchers and following diet pretty closely. PERTINENT  PMH / PSH: Recent med changes include change from fluoxetine to citalopram. Cardiac cath in June 2013  Review of Systems  Constitutional: Positive  for fatigue. Negative for fever, appetite change and unexpected weight change.  HENT: Negative for neck stiffness and tinnitus.        Chronic neck pain and headaches --seem related to stress  Eyes: Negative for pain and visual disturbance.  Respiratory: Negative for cough, chest tightness, shortness of breath and wheezing.   Cardiovascular: Negative for chest pain.  Genitourinary: Negative for dysuria and difficulty urinating.  Musculoskeletal: Positive for myalgias and arthralgias. Negative for joint swelling.  Skin: Negative for rash.  Neurological: Positive for weakness and light-headedness. Negative for syncope.  Psychiatric/Behavioral: Positive for disturbed wake/sleep cycle, dysphoric mood and decreased concentration. Negative for hallucinations, behavioral problems and confusion.       Objective:   Physical Exam  Constitutional: She is oriented to person, place, and time. She appears well-developed and well-nourished.  HENT:  Head: Normocephalic.  Mouth/Throat: Oropharynx is clear and moist.  Eyes: Conjunctivae normal and EOM are normal. Pupils are equal, round, and reactive to light.       Very slight questionable ptosis left eyelid  Musculoskeletal: Normal range of motion. She exhibits tenderness.       Mid muscle belly of trapezius, anterior chest points consistent with chronic MSK pain  Neurological: She is alert and oriented to person, place, and time. She has normal reflexes. She displays normal reflexes. No cranial nerve deficit. She exhibits normal muscle tone. Coordination normal.  Skin: Skin is warm and dry.  Psychiatric: Her behavior is normal. Judgment and thought content normal.  Worried affect Memory (recent and remote) appear normal today. Normal speech fluency and content. Asks and answers questions appropriately          Assessment & Plan:  1. Memory loss? Most likely reason would be chronic (fairly severe) social stressors. I do find it somewhat  difficult to think that her roughly 24  hour period of a blank spot is related only to stressors. It seems a bit of a stretch. I guess that could be a fugue state. Also find it difficult to reconcile these episodes with her new symptoms of upper extremity nighttime pain. Never quite fully diagnosed her lower extremity issues. They've not responded to anything we've done. They're most consistent with restless leg type syndrome but it is unusual in that it is painful further than just uncomfortable. We discussed at length. I do think at a minimum we need to do an MRI brain. I will also do some general screening lab work. I suspect the most useful test would be a neuropsychiatric evaluation we may need to pursue that. For right now she's going to go home and try to remember some more episodes and give me some more detail. She'll discuss this with her family, write down some details of given to me next week. #2. Blood pressure: She is actually on the low side today and has some history of recently having lightheadedness so I think we'll decrease her coverage discontinuing the HCTZ and cutting her aches dose in half. #3. Medications: She recently review of her medications with our pharmacist to see if he had any ideas about these causing any the issue she had above. The only recent change was made is from fluoxetine to citalopram I'll see her contact her after the MRI back. We'll decide then whether or not we need to go forward with neuropsychiatric without did applaud her exercise and weight loss plan.

## 2012-05-18 ENCOUNTER — Ambulatory Visit (HOSPITAL_COMMUNITY)
Admission: RE | Admit: 2012-05-18 | Discharge: 2012-05-18 | Disposition: A | Discharge status: Home / Self Care | Payer: 59 | Source: Ambulatory Visit | Attending: Family Medicine | Admitting: Family Medicine

## 2012-05-18 ENCOUNTER — Other Ambulatory Visit (HOSPITAL_COMMUNITY): Payer: 59

## 2012-05-18 DIAGNOSIS — J329 Chronic sinusitis, unspecified: Secondary | ICD-10-CM | POA: Insufficient documentation

## 2012-05-18 DIAGNOSIS — R413 Other amnesia: Secondary | ICD-10-CM

## 2012-05-18 DIAGNOSIS — IMO0001 Reserved for inherently not codable concepts without codable children: Secondary | ICD-10-CM

## 2012-05-18 MED ORDER — GADOBENATE DIMEGLUMINE 529 MG/ML IV SOLN
20.0000 mL | Freq: Once | INTRAVENOUS | Status: AC | PRN
  Administered 2012-05-18: 20 mL via INTRAVENOUS

## 2012-06-12 ENCOUNTER — Encounter: Payer: Self-pay | Admitting: Family Medicine

## 2012-06-12 NOTE — Progress Notes (Signed)
Patient ID: Miranda Baker, female   DOB: 1961/05/09, 51 y.o.   MRN: [id number] We again discussed her episodes of memory loss. After reviewing the details, it seems very stress related and lack of attention during these episodes. i think she is so overwhelmed with all of theissues with her husband's illness. She is under some new stress at work and is having a lot of difficulty getting her work done without mistakes and in timely fashion. Still feels like there is"white cloud" of nothingness somewhere in her head where these memories reside. I think her stress is overwhelming her ability to cope . We had done MRI which was negative and I think ruled out many of the  Pathologies. She is on celexa---we had switched from fluoxetine after we discovered possible interaction with plavix. These symptoms pre-dated the change in SSRI. They have seemed to worsen and I think this is related to new work stressors.  Discussed other paths to explore to exhaust the differential (neuropsych testing, sleep study to rule out OSA) etc. She borrowed her husband's pulsox (it records) and wore it a whole night and was never below 93% saturation. I do not know if that is continuous pulsox or not, but I think that makes OSA less likely. She also continues to have severe leg cramps 1 hour after going to sleep. Is that a type of RLS? Could do sleep study looking for periodic limb movements but treatment for that is not specific either.  Ultimately we decided to try to improve her function---her attention to the moment and thus maybe her memory. There is some history that she had some inattention as a child / adoescent. We decided to do a field trial of metadate ER. She will let me know how this goes.

## 2012-06-16 ENCOUNTER — Other Ambulatory Visit: Payer: Self-pay | Admitting: Family Medicine

## 2012-06-16 MED ORDER — CITALOPRAM HYDROBROMIDE 40 MG PO TABS: 40.0000 mg | ORAL_TABLET | Freq: Every day | ORAL | Status: DC

## 2012-06-18 ENCOUNTER — Other Ambulatory Visit: Payer: Self-pay | Admitting: Family Medicine

## 2012-06-25 ENCOUNTER — Other Ambulatory Visit: Payer: Self-pay | Admitting: Family Medicine

## 2012-06-25 MED ORDER — METHYLPHENIDATE HCL ER 20 MG PO TBCR: EXTENDED_RELEASE_TABLET | ORAL | Status: DC

## 2012-08-13 ENCOUNTER — Encounter: Payer: Self-pay | Admitting: Family Medicine

## 2012-08-13 DIAGNOSIS — R41840 Attention and concentration deficit: Secondary | ICD-10-CM | POA: Insufficient documentation

## 2012-08-28 ENCOUNTER — Other Ambulatory Visit: Payer: Self-pay | Admitting: Family Medicine

## 2012-08-28 MED ORDER — ROSUVASTATIN CALCIUM 20 MG PO TABS
20.0000 mg | ORAL_TABLET | Freq: Every day | ORAL | Status: DC
Start: 2012-08-28 — End: 2012-09-01

## 2012-08-28 MED ORDER — ALPRAZOLAM 1 MG PO TABS: ORAL_TABLET | ORAL | Status: DC

## 2012-08-28 NOTE — Progress Notes (Signed)
Doing much better with focusing and concentration wit the methylphenidate. Oddly she says it seems to have helped wit hher frustration level and some of her anxiety (baseline) as well. Can really see improved focusing and on task ability wit  Methylphenidate. Was out of it for a week due to insurance prior auth issues and was very aware of difference. In last 2 weeks has started having panic attacks again---has had these inpast when she had especially stressful times (divorce, husbands terminal illness's diagnosis). Problems at work have intensified. Would also like refill on her alprazolam. Has had three days in last 2 weeks where she had significant issues with panic manifested by heart racing, feelings of claustrophobia.  Will refill and she has f/u appt in next 1 month.

## 2012-09-01 ENCOUNTER — Other Ambulatory Visit: Payer: Self-pay | Admitting: Family Medicine

## 2012-09-01 MED ORDER — PREDNISONE 20 MG PO TABS: ORAL_TABLET | ORAL | Status: DC

## 2012-09-01 MED ORDER — LEVOFLOXACIN 500 MG PO TABS: 500.0000 mg | ORAL_TABLET | Freq: Every day | ORAL | Status: DC

## 2012-09-01 MED ORDER — ATORVASTATIN CALCIUM 80 MG PO TABS: 80.0000 mg | ORAL_TABLET | Freq: Every day | ORAL | Status: DC

## 2012-09-01 NOTE — Progress Notes (Signed)
I misunderstood insurance coverage change re statins--will switch back to Lipitor as cheaper, Also,. As the rest of her family has had sinus and pharyngitis  Infection, she now has fever, headache and sinus pain with green nasal d/c. 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  On citalopram and levaquin risk for QT pprolongation. Has used before without issue. Last QTc was 410 so not at especially high risk.

## 2012-09-11 ENCOUNTER — Emergency Department (HOSPITAL_COMMUNITY): Payer: 59

## 2012-09-11 ENCOUNTER — Emergency Department (HOSPITAL_COMMUNITY)
Admission: EM | Admit: 2012-09-11 | Discharge: 2012-09-11 | Disposition: A | Discharge status: Home / Self Care | Payer: 59 | Attending: Emergency Medicine | Admitting: Emergency Medicine

## 2012-09-11 ENCOUNTER — Encounter (HOSPITAL_COMMUNITY): Payer: Self-pay | Admitting: Family Medicine

## 2012-09-11 DIAGNOSIS — K219 Gastro-esophageal reflux disease without esophagitis: Secondary | ICD-10-CM | POA: Insufficient documentation

## 2012-09-11 DIAGNOSIS — I2510 Atherosclerotic heart disease of native coronary artery without angina pectoris: Secondary | ICD-10-CM | POA: Insufficient documentation

## 2012-09-11 DIAGNOSIS — Z79899 Other long term (current) drug therapy: Secondary | ICD-10-CM | POA: Insufficient documentation

## 2012-09-11 DIAGNOSIS — Z7902 Long term (current) use of antithrombotics/antiplatelets: Secondary | ICD-10-CM | POA: Insufficient documentation

## 2012-09-11 DIAGNOSIS — Z872 Personal history of diseases of the skin and subcutaneous tissue: Secondary | ICD-10-CM | POA: Insufficient documentation

## 2012-09-11 DIAGNOSIS — I10 Essential (primary) hypertension: Secondary | ICD-10-CM | POA: Insufficient documentation

## 2012-09-11 DIAGNOSIS — Z8679 Personal history of other diseases of the circulatory system: Secondary | ICD-10-CM | POA: Insufficient documentation

## 2012-09-11 DIAGNOSIS — M546 Pain in thoracic spine: Secondary | ICD-10-CM | POA: Insufficient documentation

## 2012-09-11 DIAGNOSIS — M549 Dorsalgia, unspecified: Secondary | ICD-10-CM

## 2012-09-11 DIAGNOSIS — Z9861 Coronary angioplasty status: Secondary | ICD-10-CM | POA: Insufficient documentation

## 2012-09-11 LAB — CBC WITH DIFFERENTIAL/PLATELET
Eosinophils Relative: 3 % (ref 0–5)
HCT: 40.2 % (ref 36.0–46.0)
Hemoglobin: 13.4 g/dL (ref 12.0–15.0)
Lymphocytes Relative: 38 % (ref 12–46)
Lymphs Abs: 4.2 10*3/uL — ABNORMAL HIGH (ref 0.7–4.0)
MCV: 89.1 fL (ref 78.0–100.0)
Monocytes Absolute: 1 10*3/uL (ref 0.1–1.0)
Monocytes Relative: 9 % (ref 3–12)
Platelets: 295 10*3/uL (ref 150–400)
RBC: 4.51 MIL/uL (ref 3.87–5.11)
WBC: 10.9 10*3/uL — ABNORMAL HIGH (ref 4.0–10.5)

## 2012-09-11 LAB — COMPREHENSIVE METABOLIC PANEL
ALT: 15 U/L (ref 0–35)
Alkaline Phosphatase: 59 U/L (ref 39–117)
BUN: 22 mg/dL (ref 6–23)
CO2: 26 mEq/L (ref 19–32)
Calcium: 9.9 mg/dL (ref 8.4–10.5)
GFR calc Af Amer: 82 mL/min — ABNORMAL LOW (ref >90)
GFR calc non Af Amer: 71 mL/min — ABNORMAL LOW (ref >90)
Glucose, Bld: 86 mg/dL (ref 70–99)
Sodium: 138 mEq/L (ref 135–145)

## 2012-09-11 LAB — POCT I-STAT TROPONIN I: Troponin i, poc: 0 ng/mL (ref 0.00–0.08)

## 2012-09-11 MED ORDER — GI COCKTAIL ~~LOC~~
30.0000 mL | Freq: Once | ORAL | Status: AC
  Administered 2012-09-11: 30 mL via ORAL
  Filled 2012-09-11: qty 30

## 2012-09-11 MED ORDER — NITROGLYCERIN 0.4 MG SL SUBL: 0.4000 mg | SUBLINGUAL_TABLET | SUBLINGUAL | Status: DC | PRN

## 2012-09-11 MED ORDER — ASPIRIN 81 MG PO CHEW
324.0000 mg | CHEWABLE_TABLET | Freq: Once | ORAL | Status: AC
  Administered 2012-09-11: 324 mg via ORAL
  Filled 2012-09-11: qty 4

## 2012-09-11 MED ORDER — OXYCODONE-ACETAMINOPHEN 5-325 MG PO TABS: 2.0000 | ORAL_TABLET | Freq: Four times a day (QID) | ORAL | Status: DC | PRN

## 2012-09-11 MED ORDER — ACETAMINOPHEN 500 MG PO TABS
1000.0000 mg | ORAL_TABLET | Freq: Once | ORAL | Status: AC
  Administered 2012-09-11: 1000 mg via ORAL
  Filled 2012-09-11: qty 2

## 2012-09-11 MED ORDER — NITROGLYCERIN 0.4 MG SL SUBL
0.4000 mg | SUBLINGUAL_TABLET | SUBLINGUAL | Status: DC | PRN
  Administered 2012-09-11: 0.4 mg via SUBLINGUAL
  Filled 2012-09-11: qty 25

## 2012-09-11 NOTE — ED Notes (Addendum)
Pt sts back pain shooting into her neck and down left arm since Wednesday. sts the pain is intermittent and could last hours at a time. sts she has been having hot flashes.

## 2012-09-11 NOTE — ED Notes (Signed)
Patient transported to X-ray 

## 2012-09-11 NOTE — ED Notes (Signed)
Patient low bp reported to rn- tracey.

## 2012-09-11 NOTE — ED Provider Notes (Signed)
Medical screening examination/treatment/procedure(s) were performed by non-physician practitioner and as supervising physician I was immediately available for consultation/collaboration.   Charles B. Bernette Mayers, MD 09/11/12 1131

## 2012-09-11 NOTE — ED Provider Notes (Signed)
History     CSN: [id number]  Arrival date & time 09/11/12  0865   First MD Initiated Contact with Patient 09/11/12 [phone]      Chief Complaint  Patient presents with  . Chest Pain    (Consider location/radiation/quality/duration/timing/severity/associated sxs/prior treatment) HPI Comments: Patient is a 52 year old with a past medical history of CAD, stent placement, HTN, and GERD who presents with a 3 day history of sharp chest pain. Patient reports gradual onset of sharp central chest pain that radiates directly to her back and down her left arm. The patient denies associated nausea, dizziness, and diaphoresis. The pain has been constant since onset with intermittent worsening without known trigger. Patient has not taken nitro or anything for pain. No aggravating/allevaiting factors. Patient denies fever, headache, visual changes, vomiting, diarrhea, abdominal pain, numbness/tingling.     Past Medical History  Diagnosis Date  . MI (mitral incompetence)   . Coronary artery disease     Stent 2.75x15 Xience 2011  . Hypertension   . GERD (gastroesophageal reflux disease)   . Constipation   . Cellulitis     Past Surgical History  Procedure Date  . Abdominal hysterectomy Remotel    History reviewed. No pertinent family history.  History  Substance Use Topics  . Smoking status: Never Smoker   . Smokeless tobacco: Never Used  . Alcohol Use: No    OB History    Grav Para Term Preterm Abortions TAB SAB Ect Mult Living                  Review of Systems  Cardiovascular: Positive for chest pain.  All other systems reviewed and are negative.    Allergies  Erythromycin; Morphine and related; Penicillins; Tetracyclines & related; and Vibramycin  Home Medications   Current Outpatient Rx  Name  Route  Sig  Dispense  Refill  . ALPRAZOLAM 1 MG PO TABS      Take one by mouth up to three times a day as needed for anxiety   90 tablet   3   . ATORVASTATIN CALCIUM 80 MG PO  TABS   Oral   Take 1 tablet (80 mg total) by mouth daily.   90 tablet   3   . CITALOPRAM HYDROBROMIDE 40 MG PO TABS   Oral   Take 1 tablet (40 mg total) by mouth daily.   90 tablet   3   . CLOPIDOGREL BISULFATE 75 MG PO TABS   Oral   Take 1 tablet (75 mg total) by mouth daily.   90 tablet   3   . FLUOXETINE HCL 40 MG PO CAPS   Oral   Take 1 capsule (40 mg total) by mouth daily.   90 capsule   3   . METHYLPHENIDATE HCL ER 20 MG PO TBCR      Take one tablet by mouth every morning dispense number ninety for three months   90 tablet   0   . METOPROLOL TARTRATE 25 MG PO TABS   Oral   Take 1 tablet (25 mg total) by mouth 2 (two) times daily.   180 tablet   3   . NITROGLYCERIN 0.4 MG SL SUBL   Sublingual   Place 1 tablet (0.4 mg total) under the tongue every 5 (five) minutes x 3 doses as needed for chest pain.   30 tablet   3   . OLMESARTAN MEDOXOMIL 20 MG PO TABS   Oral   Take  1 tablet (20 mg total) by mouth daily.   30 tablet   1     BP 94/49  Pulse 62  Temp 98.2 F (36.8 C) (Oral)  Resp 17  SpO2 96%  Physical Exam  Nursing note and vitals reviewed. Constitutional: She is oriented to person, place, and time. She appears well-developed and well-nourished. No distress.  HENT:  Head: Normocephalic and atraumatic.  Eyes: Conjunctivae normal and EOM are normal.  Neck: Normal range of motion. Neck supple.  Cardiovascular: Normal rate and regular rhythm.  Exam reveals no gallop and no friction rub.   No murmur heard. Pulmonary/Chest: Effort normal and breath sounds normal. She has no wheezes. She has no rales. She exhibits no tenderness.  Abdominal: Soft. There is no tenderness.  Musculoskeletal: Normal range of motion.  Neurological: She is alert and oriented to person, place, and time. Coordination normal.       Speech is goal-oriented. Moves limbs without ataxia.   Skin: Skin is warm and dry.  Psychiatric: She has a normal mood and affect. Her behavior  is normal.    ED Course  Procedures (including critical care time)   Date: 09/11/2012  Rate: 62  Rhythm: normal sinus rhythm  QRS Axis: normal  Intervals: normal  ST/T Wave abnormalities: normal  Conduction Disutrbances:none  Narrative Interpretation: NSR unchanged from previous  Old EKG Reviewed: unchanged    Labs Reviewed  CBC WITH DIFFERENTIAL - Abnormal; Notable for the following:    WBC 10.9 (*)     Lymphs Abs 4.2 (*)     All other components within normal limits  COMPREHENSIVE METABOLIC PANEL - Abnormal; Notable for the following:    GFR calc non Af Amer 71 (*)     GFR calc Af Amer 82 (*)     All other components within normal limits  PROTIME-INR  POCT I-STAT TROPONIN I   Dg Chest 2 View  09/11/2012  *RADIOLOGY REPORT*  Clinical Data: Chest pain  CHEST - 2 VIEW  Comparison: 01/17/2012  Findings: Upper normal heart size.  Clear lungs.  No pleural effusion and no pneumothorax.  IMPRESSION: No active cardiopulmonary disease.   Original Report Authenticated By: Jolaine Click, M.D.      1. Pain, upper back       MDM  8:54 AM Labs pending. Patient given aspirin. Chest xray pending.   11:22 AM Labs and chest xray unremarkable. Troponin negative. Patient reports pain relief with tylenol and gi cocktail and full resolution of her pain after SL nitro. I spoke with Dr. Jacinto Halim who recommeds a follow up visit with him and also a follow up visit with Dr. Loreta Ave, Gastroenterology. Vitals stable for discharge. Patient instructed to return with worsening or concerning symptoms.       Emilia Beck, PA-C 09/11/12 1130

## 2012-09-11 NOTE — ED Notes (Signed)
Discharge and Follow up instructions reviewed. Pt verbalized understanding.

## 2012-09-19 ENCOUNTER — Other Ambulatory Visit: Payer: Self-pay

## 2012-09-29 ENCOUNTER — Other Ambulatory Visit: Payer: Self-pay | Admitting: Gastroenterology

## 2012-09-29 DIAGNOSIS — K5900 Constipation, unspecified: Secondary | ICD-10-CM

## 2012-09-29 DIAGNOSIS — R109 Unspecified abdominal pain: Secondary | ICD-10-CM

## 2012-09-29 DIAGNOSIS — R112 Nausea with vomiting, unspecified: Secondary | ICD-10-CM

## 2012-09-30 ENCOUNTER — Other Ambulatory Visit: Payer: Self-pay | Admitting: Family Medicine

## 2012-09-30 MED ORDER — PANTOPRAZOLE SODIUM 40 MG PO TBEC: 40.0000 mg | DELAYED_RELEASE_TABLET | Freq: Two times a day (BID) | ORAL | Status: DC

## 2012-10-02 ENCOUNTER — Other Ambulatory Visit: Payer: 59

## 2012-10-02 ENCOUNTER — Encounter (HOSPITAL_COMMUNITY): Payer: Self-pay | Admitting: Pharmacy Technician

## 2012-10-07 ENCOUNTER — Ambulatory Visit (HOSPITAL_COMMUNITY)
Admission: RE | Admit: 2012-10-07 | Discharge: 2012-10-07 | Disposition: A | Discharge status: Home / Self Care | Payer: 59 | Source: Ambulatory Visit | Attending: Gastroenterology | Admitting: Gastroenterology

## 2012-10-07 ENCOUNTER — Encounter (HOSPITAL_COMMUNITY): Payer: Self-pay

## 2012-10-07 ENCOUNTER — Encounter (HOSPITAL_COMMUNITY)
Admission: RE | Disposition: A | Discharge status: Home / Self Care | Payer: Self-pay | Source: Ambulatory Visit | Attending: Gastroenterology

## 2012-10-07 DIAGNOSIS — R110 Nausea: Secondary | ICD-10-CM | POA: Insufficient documentation

## 2012-10-07 DIAGNOSIS — R1013 Epigastric pain: Secondary | ICD-10-CM | POA: Insufficient documentation

## 2012-10-07 DIAGNOSIS — K2970 Gastritis, unspecified, without bleeding: Secondary | ICD-10-CM | POA: Insufficient documentation

## 2012-10-07 HISTORY — PX: ESOPHAGOGASTRODUODENOSCOPY: SHX5428

## 2012-10-07 SURGERY — EGD (ESOPHAGOGASTRODUODENOSCOPY)
Anesthesia: Moderate Sedation

## 2012-10-07 MED ORDER — MIDAZOLAM HCL 10 MG/2ML IJ SOLN
INTRAMUSCULAR | Status: DC | PRN
  Administered 2012-10-07 (×3): 2.5 mg via INTRAVENOUS

## 2012-10-07 MED ORDER — FENTANYL CITRATE 0.05 MG/ML IJ SOLN
INTRAMUSCULAR | Status: DC | PRN
  Administered 2012-10-07 (×3): 25 ug via INTRAVENOUS

## 2012-10-07 MED ORDER — DIPHENHYDRAMINE HCL 50 MG/ML IJ SOLN
INTRAMUSCULAR | Status: AC
  Filled 2012-10-07: qty 1

## 2012-10-07 MED ORDER — FENTANYL CITRATE 0.05 MG/ML IJ SOLN
INTRAMUSCULAR | Status: AC
  Filled 2012-10-07: qty 4

## 2012-10-07 MED ORDER — BUTAMBEN-TETRACAINE-BENZOCAINE 2-2-14 % EX AERO
INHALATION_SPRAY | CUTANEOUS | Status: DC | PRN
  Administered 2012-10-07: 2 via TOPICAL

## 2012-10-07 MED ORDER — SODIUM CHLORIDE 0.9 % IV SOLN
INTRAVENOUS | Status: DC
  Administered 2012-10-07: 500 mL via INTRAVENOUS

## 2012-10-07 MED ORDER — DIPHENHYDRAMINE HCL 50 MG/ML IJ SOLN
INTRAMUSCULAR | Status: DC | PRN
  Administered 2012-10-07: 25 mg via INTRAVENOUS

## 2012-10-07 MED ORDER — MIDAZOLAM HCL 10 MG/2ML IJ SOLN
INTRAMUSCULAR | Status: AC
  Filled 2012-10-07: qty 4

## 2012-10-07 NOTE — H&P (Signed)
Patient interval history reviewed.  Patient examined again.  There has been no change from documented H/P dated 09/29/12 (scanned into chart from our office) except as documented above.  Assessment:  1.  Epigastric abdominal pain. 2.  Nausea.  Plan:  1.  Endoscopy. 2.  Risks (bleeding, infection, bowel perforation that could require surgery, sedation-related changes in cardiopulmonary systems), benefits (identification and possible treatment of source of symptoms, exclusion of certain causes of symptoms), and alternatives (watchful waiting, radiographic imaging studies, empiric medical treatment) of upper endoscopy (EGD) were explained to patient  in detail and she wishes to proceed.

## 2012-10-07 NOTE — Op Note (Signed)
[facility] [address] [place], [address]   ENDOSCOPY PROCEDURE REPORT  PATIENT: Miranda Baker, Miranda Baker.  MR#: [id number] BIRTHDATE: 1961-01-03 , 51  yrs. old GENDER: Female ENDOSCOPIST: Willis Modena, MD REFERRED BY:  Denny Levy, M.D. PROCEDURE DATE:  10/07/2012 PROCEDURE:  EGD, diagnostic ASA CLASS:     Class III INDICATIONS:  epigastric abdominal pain, nausea. MEDICATIONS: Fentanyl 75 mcg IV, Versed 7.5 mg IV, and Benadryl 25 mg IV TOPICAL ANESTHETIC: Cetacaine Spray  DESCRIPTION OF PROCEDURE: After the risks benefits and alternatives of the procedure were thoroughly explained, informed consent was obtained.  The Pentax Gastroscope [id number] endoscope was introduced through the mouth and advanced to the   . Without limitations.  The instrument was slowly withdrawn as the mucosa was fully examined.    Findings: Normal esophagus.  Stomach with mild antral gastritis, otherwise normal.  Retroflexed view into cardia was normal.   Normal duodenum to the second portion       The scope was then withdrawn from the patient and the procedure completed.  ENDOSCOPIC IMPRESSION:     As above.  Mild gastritis, otherwise normal.  No explanation for the patient's symptoms were seen.   RECOMMENDATIONS:     1.  Watch for potential complications of procedure. 2.  Continue pantoprazole 40 mg by mouth twice-a-day for the time-being. 3.  Abdominal ultrasound. 4.  Follow-up with [facility] after ultrasound has been completed.  eSigned:  Willis Modena, MD 10/07/2012 7:57 AM   CC:

## 2012-10-08 ENCOUNTER — Ambulatory Visit
Admission: RE | Admit: 2012-10-08 | Discharge: 2012-10-08 | Disposition: A | Discharge status: Home / Self Care | Payer: 59 | Source: Ambulatory Visit | Attending: Gastroenterology | Admitting: Gastroenterology

## 2012-10-08 ENCOUNTER — Encounter (HOSPITAL_COMMUNITY): Payer: Self-pay | Admitting: Gastroenterology

## 2012-10-08 ENCOUNTER — Other Ambulatory Visit: Payer: Self-pay | Admitting: Family Medicine

## 2012-10-08 DIAGNOSIS — K5900 Constipation, unspecified: Secondary | ICD-10-CM

## 2012-10-08 DIAGNOSIS — R109 Unspecified abdominal pain: Secondary | ICD-10-CM

## 2012-10-08 DIAGNOSIS — R112 Nausea with vomiting, unspecified: Secondary | ICD-10-CM

## 2012-10-08 MED ORDER — CLOPIDOGREL BISULFATE 75 MG PO TABS: 75.0000 mg | ORAL_TABLET | Freq: Every morning | ORAL | Status: DC

## 2012-11-06 ENCOUNTER — Other Ambulatory Visit: Payer: Self-pay | Admitting: *Deleted

## 2012-11-06 MED ORDER — OLMESARTAN MEDOXOMIL-HCTZ 40-12.5 MG PO TABS: 1.0000 | ORAL_TABLET | Freq: Every morning | ORAL | Status: DC

## 2012-11-16 ENCOUNTER — Other Ambulatory Visit: Payer: Self-pay | Admitting: Family Medicine

## 2012-11-16 MED ORDER — PREDNISONE 10 MG PO TABS: 20.0000 mg | ORAL_TABLET | Freq: Every day | ORAL | Status: DC

## 2012-11-16 NOTE — Progress Notes (Signed)
Flair SOB c/w asthma. Taking her allergy meds---mild wheexing expiration on exam. No distress. Will do low dose 5 day burst.

## 2012-11-25 ENCOUNTER — Other Ambulatory Visit: Payer: Self-pay | Admitting: Family Medicine

## 2012-11-25 MED ORDER — OLOPATADINE HCL 0.2 % OP SOLN: OPHTHALMIC | Status: DC

## 2012-11-26 ENCOUNTER — Other Ambulatory Visit: Payer: Self-pay | Admitting: Family Medicine

## 2012-12-14 ENCOUNTER — Other Ambulatory Visit: Payer: Self-pay | Admitting: Family Medicine

## 2012-12-14 MED ORDER — METHYLPHENIDATE HCL ER 20 MG PO TBCR: EXTENDED_RELEASE_TABLET | ORAL | Status: DC

## 2012-12-14 NOTE — Progress Notes (Signed)
F/u starting ritalin. Really seems to be helping. Focus much better. No side effects . Has also started back working out--run walking several days a week.

## 2013-01-22 ENCOUNTER — Ambulatory Visit (INDEPENDENT_AMBULATORY_CARE_PROVIDER_SITE_OTHER): Payer: 59 | Admitting: Family Medicine

## 2013-01-22 VITALS — BP 110/58 | Temp 98.7°F

## 2013-01-22 DIAGNOSIS — I2510 Atherosclerotic heart disease of native coronary artery without angina pectoris: Secondary | ICD-10-CM

## 2013-01-22 DIAGNOSIS — R5383 Other fatigue: Secondary | ICD-10-CM

## 2013-01-22 DIAGNOSIS — I10 Essential (primary) hypertension: Secondary | ICD-10-CM

## 2013-01-22 DIAGNOSIS — F341 Dysthymic disorder: Secondary | ICD-10-CM

## 2013-01-22 DIAGNOSIS — R41840 Attention and concentration deficit: Secondary | ICD-10-CM

## 2013-01-22 DIAGNOSIS — R5381 Other malaise: Secondary | ICD-10-CM

## 2013-01-22 LAB — CBC WITH DIFFERENTIAL/PLATELET
Basophils Relative: 1 % (ref 0–1)
Eosinophils Absolute: 0.2 10*3/uL (ref 0.0–0.7)
HCT: 38 % (ref 36.0–46.0)
Hemoglobin: 12.5 g/dL (ref 12.0–15.0)
MCH: 28.4 pg (ref 26.0–34.0)
MCHC: 32.9 g/dL (ref 30.0–36.0)
Monocytes Absolute: 0.4 10*3/uL (ref 0.1–1.0)
Monocytes Relative: 7 % (ref 3–12)

## 2013-01-22 LAB — COMPREHENSIVE METABOLIC PANEL
ALT: 15 U/L (ref 0–35)
AST: 17 U/L (ref 0–37)
Chloride: 107 mEq/L (ref 96–112)
Creat: 0.9 mg/dL (ref 0.50–1.10)
Total Bilirubin: 0.6 mg/dL (ref 0.3–1.2)

## 2013-01-22 MED ORDER — OLMESARTAN MEDOXOMIL-HCTZ 40-12.5 MG PO TABS: ORAL_TABLET | ORAL | Status: DC

## 2013-01-25 MED ORDER — OLMESARTAN MEDOXOMIL-HCTZ 40-12.5 MG PO TABS: ORAL_TABLET | ORAL | Status: DC

## 2013-01-25 MED ORDER — METHYLPHENIDATE HCL ER 20 MG PO TBCR: EXTENDED_RELEASE_TABLET | ORAL | Status: DC

## 2013-01-25 NOTE — Progress Notes (Signed)
  Subjective:    Patient ID: Miranda Baker, female    DOB: 07-Feb-1961, 52 y.o.   MRN: [id number]  HPI  1. F/u CAD; No chest pains, no sob. No edema. Taking meds regularly. Has no complaints. Trying to walk most day of week.  2. F/u Attentional issues---much better on meds. They do make her a little fatigued after it wears off---she wonders about twice a day dosing. No other side effects. Has been "100%" better on medication with her focus and attentional issues.   3. Hyperlipidemia---following a pretty strict diet--taking mer meds regulalry without problem.  4.Stress--increased some .husband has had continued progression of  Pulmonary fibrosis. The transplant team says he is at next "stage" and they are going to do some more tests in readiment for possible transplant.  Review of Systems See hpi     Objective:   Physical Exam  Vital signs reviewed GENERALl: Well developed, well nourished, in no acute distress. NECK: Supple, FROM, without lymphadenopathy.  THYROID: normal without nodularity CAROTID ARTERIES: without bruits LUNGS: clear to auscultation bilaterally. No wheezes or rales. HEART: Regular rate and rhythm, no murmurs ABDOMEN: soft with positive bowel sounds MSK: MOE x 4 SKIN no rash NEURO: no focal deficits PSYCH Interactive, asks and answers questions appropriately. Normal interaction.      Assessment & Plan:

## 2013-01-25 NOTE — Assessment & Plan Note (Signed)
Will decrease her benicar to half a tab a day. With her LSm we may have her a little overmedicated--maybe contributing to fatigue. Check bmp

## 2013-01-25 NOTE — Assessment & Plan Note (Signed)
Congratulated on exercise and diet modification.continue current meds.

## 2013-01-25 NOTE — Assessment & Plan Note (Signed)
Seems to have made a big difference for her. If her labs come back Ok (checkiong for other reasons for fatigue) and the change in BP med does not improve her fatigue, then I think it probably is related to mid afternoon medication after effect and we should probably increase to BID.

## 2013-03-10 ENCOUNTER — Other Ambulatory Visit: Payer: Self-pay | Admitting: Family Medicine

## 2013-03-10 MED ORDER — FLUCONAZOLE 100 MG PO TABS: ORAL_TABLET | ORAL | Status: DC

## 2013-03-16 ENCOUNTER — Other Ambulatory Visit: Payer: Self-pay | Admitting: Family Medicine

## 2013-03-16 MED ORDER — METHYLPHENIDATE HCL ER 20 MG PO TBCR: EXTENDED_RELEASE_TABLET | ORAL | Status: DC

## 2013-03-16 MED ORDER — ALPRAZOLAM 1 MG PO TABS: ORAL_TABLET | ORAL | Status: DC

## 2013-03-16 NOTE — Progress Notes (Signed)
Doing well/ Some increased recent stressors re husband's health decline. On some days (when really busy or a lot of extra intense work) she tales a second tablet mid day. Maybe once or twice a week. Denny Levy cannot get to print willhand write

## 2013-05-25 ENCOUNTER — Other Ambulatory Visit: Payer: Self-pay | Admitting: Family Medicine

## 2013-05-25 MED ORDER — DIFLUPREDNATE 0.05 % OP EMUL: OPHTHALMIC | Status: DC

## 2013-06-10 ENCOUNTER — Other Ambulatory Visit: Payer: Self-pay

## 2013-06-29 ENCOUNTER — Other Ambulatory Visit: Payer: Self-pay | Admitting: Family Medicine

## 2013-06-29 MED ORDER — METOPROLOL TARTRATE 25 MG PO TABS: 25.0000 mg | ORAL_TABLET | Freq: Two times a day (BID) | ORAL | Status: DC

## 2013-07-23 ENCOUNTER — Other Ambulatory Visit: Payer: Self-pay | Admitting: Family Medicine

## 2013-07-23 ENCOUNTER — Encounter (HOSPITAL_COMMUNITY): Payer: Self-pay

## 2013-07-23 ENCOUNTER — Ambulatory Visit (HOSPITAL_COMMUNITY)
Admission: RE | Admit: 2013-07-23 | Discharge: 2013-07-23 | Disposition: A | Discharge status: Home / Self Care | Payer: 59 | Source: Ambulatory Visit | Attending: Family Medicine | Admitting: Family Medicine

## 2013-07-23 DIAGNOSIS — L0201 Cutaneous abscess of face: Secondary | ICD-10-CM

## 2013-07-23 DIAGNOSIS — J3489 Other specified disorders of nose and nasal sinuses: Secondary | ICD-10-CM | POA: Insufficient documentation

## 2013-07-23 DIAGNOSIS — J0100 Acute maxillary sinusitis, unspecified: Secondary | ICD-10-CM

## 2013-07-23 DIAGNOSIS — R51 Headache: Secondary | ICD-10-CM | POA: Insufficient documentation

## 2013-07-23 DIAGNOSIS — J342 Deviated nasal septum: Secondary | ICD-10-CM | POA: Insufficient documentation

## 2013-07-23 MED ORDER — PREDNISONE 20 MG PO TABS: ORAL_TABLET | ORAL | Status: DC

## 2013-07-23 MED ORDER — SULFAMETHOXAZOLE-TRIMETHOPRIM 800-160 MG PO TABS: 1.0000 | ORAL_TABLET | Freq: Two times a day (BID) | ORAL | Status: DC

## 2013-08-09 ENCOUNTER — Other Ambulatory Visit: Payer: Self-pay | Admitting: Family Medicine

## 2013-08-09 MED ORDER — METHYLPHENIDATE HCL ER 20 MG PO TBCR: EXTENDED_RELEASE_TABLET | ORAL | Status: DC

## 2013-08-09 MED ORDER — ALPRAZOLAM 1 MG PO TABS: ORAL_TABLET | ORAL | Status: DC

## 2013-08-09 MED ORDER — CITALOPRAM HYDROBROMIDE 40 MG PO TABS: 40.0000 mg | ORAL_TABLET | Freq: Every day | ORAL | Status: DC

## 2013-10-11 ENCOUNTER — Other Ambulatory Visit: Payer: Self-pay | Admitting: *Deleted

## 2013-10-11 MED ORDER — DIFLUPREDNATE 0.05 % OP EMUL: OPHTHALMIC | Status: DC

## 2013-10-11 MED ORDER — DIFLUPREDNATE 0.05 % OP EMUL: 1.0000 [drp] | Freq: Four times a day (QID) | OPHTHALMIC | Status: DC

## 2013-12-20 ENCOUNTER — Other Ambulatory Visit: Payer: Self-pay | Admitting: Family Medicine

## 2013-12-20 MED ORDER — ATORVASTATIN CALCIUM 80 MG PO TABS: 80.0000 mg | ORAL_TABLET | Freq: Every morning | ORAL | Status: DC

## 2013-12-24 ENCOUNTER — Other Ambulatory Visit: Payer: Self-pay | Admitting: Family Medicine

## 2013-12-24 MED ORDER — METHYLPHENIDATE HCL ER 20 MG PO TBCR: EXTENDED_RELEASE_TABLET | ORAL | Status: DC

## 2014-01-25 ENCOUNTER — Other Ambulatory Visit: Payer: Self-pay | Admitting: Family Medicine

## 2014-02-14 ENCOUNTER — Encounter: Payer: Self-pay | Admitting: Family Medicine

## 2014-02-14 ENCOUNTER — Ambulatory Visit (INDEPENDENT_AMBULATORY_CARE_PROVIDER_SITE_OTHER): Payer: 59 | Admitting: Family Medicine

## 2014-02-14 VITALS — BP 107/72 | Ht 64.0 in | Wt 186.0 lb

## 2014-02-14 DIAGNOSIS — M25579 Pain in unspecified ankle and joints of unspecified foot: Secondary | ICD-10-CM

## 2014-02-14 DIAGNOSIS — M25572 Pain in left ankle and joints of left foot: Secondary | ICD-10-CM

## 2014-02-15 NOTE — Progress Notes (Signed)
Patient ID: Miranda CampbellDarcy J Coppess, female   DOB: [birth year], 53 y.o.   MRN: [id number]  Miranda CampbellDarcy J Baker - 53 y.o. female MRN [id number]  Date of birth: [birth year]    SUBJECTIVE:     Left foot pain. Long history of Morton's neuroma and has tried multiple inserts for pain relief of that without success. Over the last week and half she's had new onset left heel pain. Sharp in nature. Hurts after walking or standing for long period of time. Also hurts to she's been sitting for a while. Seems to radiate from her heel all the way to her forefoot. No new injury, no new activities, no new shoes. ROS:     No fever, sweats, chills, unusual weight change.  PERTINENT  PMH / PSH FH / / SH:  Past Medical, Surgical, Social, and Family History Reviewed & Updated in the EMR.  Pertinent findings include:  Nonsmoker. No personal history diabetes mellitus. No peripheral neuropathy.  OBJECTIVE: BP 107/72  Ht 5\' 4"  (1.626 m)  Wt 186 lb (84.369 kg)  BMI 31.91 kg/m2  Physical Exam:  Vital signs are reviewed. GENERAL: Well-developed female no acute distress Foot: Left. Tender to palpation under the third-fourth metatarsal head. To palpation plantar surface of the heel but not exactly over the origin the plantar fascia, more toward the Achilles insertion area but the true Achilles insertion is nontender to palpation. ULTRASOUND: Morton's neuroma is seen. Plantar fascia is seen with no increased upper activity around it and small amount of effusion. There is a small heel spur.  ASSESSMENT & PLAN:  See problem based charting & AVS for pt instructions.\ Foot pain. Combination of Morton's neuroma, antalgic gait from that and now he'll pain. I don't really think this is classic plantar fasciitis, seems more consistent with a heel contusion. I think she's been walking abnormally secondary to the nontreated Morton's neuroma. We'll place her in 90 postop shoe with green inserts and scaphoid pad. Followup 2 weeks.

## 2014-03-01 ENCOUNTER — Other Ambulatory Visit: Payer: Self-pay | Admitting: Family Medicine

## 2014-03-01 MED ORDER — NITROGLYCERIN 0.2 MG/HR TD PT24: MEDICATED_PATCH | TRANSDERMAL | Status: DC

## 2014-03-09 ENCOUNTER — Other Ambulatory Visit: Payer: Self-pay | Admitting: Family Medicine

## 2014-03-09 MED ORDER — OLOPATADINE HCL 0.2 % OP SOLN: OPHTHALMIC | Status: DC

## 2014-03-22 ENCOUNTER — Other Ambulatory Visit: Payer: Self-pay | Admitting: Family Medicine

## 2014-03-22 MED ORDER — ALPRAZOLAM 1 MG PO TABS: ORAL_TABLET | ORAL | Status: DC

## 2014-03-22 NOTE — Progress Notes (Signed)
Increased work stressors. Not taking 3 a day but many times 2 a day and it appears stress is going to escalate.

## 2014-03-28 ENCOUNTER — Other Ambulatory Visit: Payer: Self-pay | Admitting: Family Medicine

## 2014-04-06 ENCOUNTER — Other Ambulatory Visit: Payer: Self-pay | Admitting: Family Medicine

## 2014-04-06 MED ORDER — FLUCONAZOLE 100 MG PO TABS: ORAL_TABLET | ORAL | Status: DC

## 2014-05-02 ENCOUNTER — Other Ambulatory Visit: Payer: Self-pay | Admitting: Family Medicine

## 2014-05-02 MED ORDER — TRAMADOL HCL 50 MG PO TABS: 50.0000 mg | ORAL_TABLET | Freq: Three times a day (TID) | ORAL | Status: DC | PRN

## 2014-05-02 NOTE — Progress Notes (Signed)
Handwritten rx given to her  Working well for foot pain--stopping NTG patch as it has not seemed to help much more than initially. Miranda Baker

## 2014-05-20 ENCOUNTER — Other Ambulatory Visit: Payer: Self-pay

## 2014-05-23 ENCOUNTER — Other Ambulatory Visit: Payer: Self-pay | Admitting: *Deleted

## 2014-05-23 ENCOUNTER — Other Ambulatory Visit: Payer: Self-pay | Admitting: Family Medicine

## 2014-05-23 MED ORDER — METHYLPHENIDATE HCL ER 20 MG PO TBCR: EXTENDED_RELEASE_TABLET | ORAL | Status: DC

## 2014-05-23 NOTE — Progress Notes (Signed)
Doing well new job No meds problems Refill dropped off at pharmacy for her Denny LevySara Ladon Vandenberghe

## 2014-06-08 ENCOUNTER — Other Ambulatory Visit: Payer: Self-pay | Admitting: Family Medicine

## 2014-06-08 MED ORDER — FLUCONAZOLE 100 MG PO TABS: ORAL_TABLET | ORAL | Status: DC

## 2014-06-14 ENCOUNTER — Ambulatory Visit (INDEPENDENT_AMBULATORY_CARE_PROVIDER_SITE_OTHER): Payer: 59 | Admitting: *Deleted

## 2014-06-14 ENCOUNTER — Ambulatory Visit: Payer: 59

## 2014-06-14 DIAGNOSIS — Z23 Encounter for immunization: Secondary | ICD-10-CM

## 2014-06-24 ENCOUNTER — Other Ambulatory Visit: Payer: Self-pay | Admitting: Family Medicine

## 2014-06-24 MED ORDER — TRAMADOL HCL 50 MG PO TABS: 50.0000 mg | ORAL_TABLET | Freq: Three times a day (TID) | ORAL | Status: DC | PRN

## 2014-06-24 MED ORDER — ALPRAZOLAM 1 MG PO TABS: ORAL_TABLET | ORAL | Status: DC

## 2014-07-14 ENCOUNTER — Encounter (HOSPITAL_COMMUNITY): Payer: Self-pay | Admitting: Cardiology

## 2014-07-18 ENCOUNTER — Encounter: Payer: Self-pay | Admitting: Family Medicine

## 2014-07-18 ENCOUNTER — Telehealth: Payer: Self-pay | Admitting: *Deleted

## 2014-07-18 DIAGNOSIS — I10 Essential (primary) hypertension: Secondary | ICD-10-CM

## 2014-07-18 DIAGNOSIS — I25118 Atherosclerotic heart disease of native coronary artery with other forms of angina pectoris: Secondary | ICD-10-CM

## 2014-07-18 NOTE — Telephone Encounter (Signed)
Dear Cliffton AstersWhite Team Done Denny LevySara Naly Schwanz

## 2014-07-18 NOTE — Telephone Encounter (Signed)
-----   Message from Weyerhaeuser CompanyDarcy J Vanderploeg to Nestor RampSara L Neal, MD sent at 07/18/2014 8:36 AM -----      Wilfrid LundWil you please put in an order for a fasting blood draw? It has been a long time and I can have it done here.    Thank you, Jewel Baizearcy

## 2014-07-27 ENCOUNTER — Other Ambulatory Visit (INDEPENDENT_AMBULATORY_CARE_PROVIDER_SITE_OTHER): Payer: 59

## 2014-07-27 DIAGNOSIS — I25118 Atherosclerotic heart disease of native coronary artery with other forms of angina pectoris: Secondary | ICD-10-CM

## 2014-07-27 DIAGNOSIS — I10 Essential (primary) hypertension: Secondary | ICD-10-CM

## 2014-07-27 LAB — LIPID PANEL
CHOL/HDL RATIO: 3.7 ratio
Cholesterol: 205 mg/dL — ABNORMAL HIGH (ref 0–200)
HDL: 55 mg/dL (ref >39)
LDL CALC: 117 mg/dL — AB (ref 0–99)
Triglycerides: 167 mg/dL — ABNORMAL HIGH (ref <150)
VLDL: 33 mg/dL (ref 0–40)

## 2014-07-27 LAB — COMPREHENSIVE METABOLIC PANEL
ALK PHOS: 53 U/L (ref 39–117)
ALT: 25 U/L (ref 0–35)
AST: 21 U/L (ref 0–37)
Albumin: 4.3 g/dL (ref 3.5–5.2)
BUN: 15 mg/dL (ref 6–23)
CO2: 27 mEq/L (ref 19–32)
CREATININE: 0.89 mg/dL (ref 0.50–1.10)
Calcium: 10.2 mg/dL (ref 8.4–10.5)
Chloride: 105 mEq/L (ref 96–112)
Glucose, Bld: 86 mg/dL (ref 70–99)
Potassium: 4.9 mEq/L (ref 3.5–5.3)
Sodium: 140 mEq/L (ref 135–145)
Total Bilirubin: 0.7 mg/dL (ref 0.2–1.2)
Total Protein: 6.6 g/dL (ref 6.0–8.3)

## 2014-07-28 LAB — CBC WITH DIFFERENTIAL/PLATELET
BASOS ABS: 0.1 10*3/uL (ref 0.0–0.1)
BASOS PCT: 1 % (ref 0–1)
Eosinophils Absolute: 0.2 10*3/uL (ref 0.0–0.7)
Eosinophils Relative: 4 % (ref 0–5)
HCT: 39.5 % (ref 36.0–46.0)
Hemoglobin: 13.1 g/dL (ref 12.0–15.0)
Lymphocytes Relative: 34 % (ref 12–46)
Lymphs Abs: 2.1 10*3/uL (ref 0.7–4.0)
MCH: 28.9 pg (ref 26.0–34.0)
MCHC: 33.2 g/dL (ref 30.0–36.0)
MCV: 87 fL (ref 78.0–100.0)
MONO ABS: 0.5 10*3/uL (ref 0.1–1.0)
MPV: 10.9 fL (ref 9.4–12.4)
Monocytes Relative: 8 % (ref 3–12)
NEUTROS ABS: 3.3 10*3/uL (ref 1.7–7.7)
NEUTROS PCT: 53 % (ref 43–77)
PLATELETS: 299 10*3/uL (ref 150–400)
RBC: 4.54 MIL/uL (ref 3.87–5.11)
RDW: 13.7 % (ref 11.5–15.5)
WBC: 6.2 10*3/uL (ref 4.0–10.5)

## 2014-08-01 ENCOUNTER — Encounter: Payer: Self-pay | Admitting: Family Medicine

## 2014-08-03 ENCOUNTER — Other Ambulatory Visit: Payer: Self-pay | Admitting: Family Medicine

## 2014-09-26 ENCOUNTER — Encounter: Payer: Self-pay | Admitting: Family Medicine

## 2014-09-26 ENCOUNTER — Ambulatory Visit (INDEPENDENT_AMBULATORY_CARE_PROVIDER_SITE_OTHER): Payer: 59 | Admitting: Family Medicine

## 2014-09-26 VITALS — BP 147/84 | HR 82 | Ht 64.0 in | Wt 188.0 lb

## 2014-09-26 DIAGNOSIS — M5416 Radiculopathy, lumbar region: Secondary | ICD-10-CM

## 2014-09-26 DIAGNOSIS — E785 Hyperlipidemia, unspecified: Secondary | ICD-10-CM

## 2014-09-26 DIAGNOSIS — I25118 Atherosclerotic heart disease of native coronary artery with other forms of angina pectoris: Secondary | ICD-10-CM

## 2014-09-26 MED ORDER — GABAPENTIN 100 MG PO CAPS: ORAL_CAPSULE | ORAL | Status: DC

## 2014-09-26 NOTE — Patient Instructions (Signed)
Take by tablet by mouth Day 1-3:   take one at night  Day 4-6:  Take 2 at night Day 7-10:  take 3 at night Day 11-14:  3 at night, 1 in am Day 15-18:   3 at night, 2 in am Day 19-21: 3 at night, 3 in am Day 22-25: 3 at night, 3 in am, 2 at lunch Day 26 forward : 3 at night, 3 in am, 3 at lunch   

## 2014-09-27 DIAGNOSIS — E785 Hyperlipidemia, unspecified: Secondary | ICD-10-CM | POA: Insufficient documentation

## 2014-09-27 NOTE — Assessment & Plan Note (Signed)
She was originally scheduled just for checkup today but given the severity of her symptoms I think we need to proceed with MRI. I suspect she has herniated disc. I'm a little concerned about her episodes of stumbling, I suspect she's getting foot drop with fatigue. She had one episode of bladder incontinence, unclear if that is related to back issues or if that was an isolated incident. Notably that incident involved some increased Valsalva as she leaned over to pickup something off the floor when she had a full bladder. I will start her on gabapentin to taper up to 300 mg at night we will get the MRI the next few days might his Artie been scheduled. She'll call me with any new or worsening symptoms.

## 2014-09-27 NOTE — Progress Notes (Signed)
Patient ID: Miranda CampbellDarcy J Larmer, female   DOB: Feb 11, 1961, 54 y.o.   MRN: [id number]  Miranda Baker - 54 y.o. female MRN [id number]  Date of birth: Feb 11, 1961    SUBJECTIVE:     2-3 weeks of right posterior thigh pain that radiates into the upper portion of the calf. [place], aching pain feels like a hot night. Occasionally will travel down to the ankle. Worse with extended periods of standing or sitting. Occasionally relieved by lying down. Has also noticed that she has been stumbling a lot with the right foot. Her husband noted that she's not picking it up fully all the time and he thinks that's why she stumbling. This all started after a visit to her gynecologist when she had extended pelvic exam/pelvic ultrasound. She's also had one episode of urinary incontinence. #2. Follow-up on some other issues while she's here: Saw her cardiologist and he tried her on Crestor but she thought it was causing her leg pain so she stopped that when back to the Lipitor. She then determined that the leg pain was unrelated to the cholesterol medicine she's going to stick with the Lipitor and some new medication that he has prescribed but she cannot remember the the name of. ROS:     No fecal incontinence. Leg pain and other symptoms as per history of present illness. No unusual weight change. No fever, sweats, chills.  PERTINENT  PMH / PSH FH / / SH:  Past Medical, Surgical, Social, and Family History Reviewed & Updated in the EMR.  Pertinent findings include:  Coronary artery disease status post PTCA  hypertension GERD ADHD Dysthymia Hyperlipidemia Obesity Surgical menopause  OBJECTIVE: BP 147/84 mmHg  Pulse 82  Ht 5\' 4"  (1.626 m)  Wt 188 lb (85.276 kg)  BMI 32.25 kg/m2  Physical Exam:  Vital signs are reviewed. GEN.: Well-developed female no acute distress. Some obvious discomfort. BACK: Nontender to palpation. No defect noted. Vertebra are nontender to percussion. She has full flexion at the hips and  hyperextension, both of which are painless. Straight leg raise is positive in his seated and supine position on the right. EXTREMITY: Lower extremity strength 5 out of 5 in hip flexor/extensor, knee flexor and extensor, dorsiflexion and plantar flexion. NEURO: Reflexes are 2-3+ bilaterally equal at the knee and are 2+ bilaterally equal at the Achilles tendon. She has intact sensation to soft touch and intake 2 point discrimination distally GAIT: Normal. I see no evidence of foot drop.  ASSESSMENT & PLAN:  See problem based charting & AVS for pt instructions. Regarding updating health maintenance, I gave her a prescription to get a tetanus shot. She reports she's had a colonoscopy but needs to get me the reports lichen scan again. She's going to get her mammogram done and have that sent to me as well. She has had recent cholesterol screening is followed by cardiology.

## 2014-09-29 ENCOUNTER — Other Ambulatory Visit (INDEPENDENT_AMBULATORY_CARE_PROVIDER_SITE_OTHER): Payer: 59

## 2014-09-29 DIAGNOSIS — I25118 Atherosclerotic heart disease of native coronary artery with other forms of angina pectoris: Secondary | ICD-10-CM

## 2014-09-29 LAB — VITAMIN B12: Vitamin B-12: 1131 pg/mL — ABNORMAL HIGH (ref 211–911)

## 2014-09-30 LAB — VITAMIN D 25 HYDROXY (VIT D DEFICIENCY, FRACTURES): VIT D 25 HYDROXY: 31 ng/mL (ref 30–100)

## 2014-10-01 ENCOUNTER — Ambulatory Visit (HOSPITAL_BASED_OUTPATIENT_CLINIC_OR_DEPARTMENT_OTHER)
Admission: RE | Admit: 2014-10-01 | Discharge: 2014-10-01 | Disposition: A | Discharge status: Home / Self Care | Payer: 59 | Source: Ambulatory Visit | Attending: Family Medicine | Admitting: Family Medicine

## 2014-10-01 DIAGNOSIS — M419 Scoliosis, unspecified: Secondary | ICD-10-CM | POA: Insufficient documentation

## 2014-10-01 DIAGNOSIS — M47896 Other spondylosis, lumbar region: Secondary | ICD-10-CM | POA: Diagnosis not present

## 2014-10-01 DIAGNOSIS — M5416 Radiculopathy, lumbar region: Secondary | ICD-10-CM

## 2014-10-01 DIAGNOSIS — M25551 Pain in right hip: Secondary | ICD-10-CM | POA: Diagnosis present

## 2014-10-01 DIAGNOSIS — M5116 Intervertebral disc disorders with radiculopathy, lumbar region: Secondary | ICD-10-CM | POA: Insufficient documentation

## 2014-10-03 ENCOUNTER — Telehealth: Payer: Self-pay | Admitting: Family Medicine

## 2014-10-03 ENCOUNTER — Other Ambulatory Visit: Payer: Self-pay | Admitting: *Deleted

## 2014-10-03 DIAGNOSIS — M5416 Radiculopathy, lumbar region: Secondary | ICD-10-CM

## 2014-10-03 NOTE — Telephone Encounter (Signed)
Miranda Baker or Miranda Baker Please set her up for epidural steroid injections--MRI shows disc fragment at RIGHT L4 on MRI\THANKS! Denny LevySara Deanza Upperman

## 2014-10-03 NOTE — Telephone Encounter (Signed)
I called [facility] and they will setup her ESI appt.

## 2014-10-05 ENCOUNTER — Other Ambulatory Visit: Payer: Self-pay | Admitting: Family Medicine

## 2014-10-05 DIAGNOSIS — M5416 Radiculopathy, lumbar region: Secondary | ICD-10-CM

## 2014-10-11 ENCOUNTER — Other Ambulatory Visit: Payer: Self-pay | Admitting: Family Medicine

## 2014-10-11 MED ORDER — METHYLPHENIDATE HCL ER 20 MG PO TBCR: EXTENDED_RELEASE_TABLET | ORAL | Status: DC

## 2014-10-14 ENCOUNTER — Ambulatory Visit
Admission: RE | Admit: 2014-10-14 | Discharge: 2014-10-14 | Disposition: A | Discharge status: Home / Self Care | Payer: 59 | Source: Ambulatory Visit | Attending: Family Medicine | Admitting: Family Medicine

## 2014-10-14 DIAGNOSIS — M5416 Radiculopathy, lumbar region: Secondary | ICD-10-CM

## 2014-10-14 MED ORDER — IOHEXOL 180 MG/ML  SOLN
1.0000 mL | Freq: Once | INTRAMUSCULAR | Status: AC | PRN
  Administered 2014-10-14: 1 mL via EPIDURAL

## 2014-10-14 MED ORDER — METHYLPREDNISOLONE ACETATE 40 MG/ML INJ SUSP (RADIOLOG
120.0000 mg | Freq: Once | INTRAMUSCULAR | Status: AC
  Administered 2014-10-14: 120 mg via EPIDURAL

## 2014-10-14 NOTE — Discharge Instructions (Signed)

## 2014-11-07 ENCOUNTER — Encounter: Payer: Self-pay | Admitting: Family Medicine

## 2014-11-07 ENCOUNTER — Ambulatory Visit (INDEPENDENT_AMBULATORY_CARE_PROVIDER_SITE_OTHER): Payer: 59 | Admitting: Family Medicine

## 2014-11-07 VITALS — BP 120/66 | HR 66

## 2014-11-07 DIAGNOSIS — M5416 Radiculopathy, lumbar region: Secondary | ICD-10-CM | POA: Diagnosis not present

## 2014-11-07 MED ORDER — GABAPENTIN 300 MG PO CAPS: ORAL_CAPSULE | ORAL | Status: DC

## 2014-11-08 NOTE — Assessment & Plan Note (Addendum)
Epidural steroid injection sounds like it was done successfully but she had very little relief from it. Unfortunately she stopped the gabapentin for when she had the injection because she misunderstood my instructions. We will start to taper the gabapentin backup fairly rapidly. I told her to go ahead and call for the second epidural steroid injection to see if that gives her more benefit.  On review of her MRI, I am interested in  the levoconvex lumbar scoliosis with rotary component as I wonder if this is contributing to her increase in symptoms with standing.( Posture contributing) She seems to be in fair amount of pain today and is not excited about considering surgery but I think we have already  partially failed conservative treatment. Certainly we will try the second epidural steroid injection and taper up her gabapentin but I would feel more comfortable if she'll go ahead and have an evaluation by neurosurgery. She is amenable to this plan. I will see her back in 2-3 weeks. We discussed red flags such as bowel or bladder incontinence, increasing or worsening of lower extremity numbness, falls etc. She can continue tramadol when necessary. Greater than 50% of our 35 minute office visit was spent in counseling and education regarding these issues.

## 2014-11-08 NOTE — Progress Notes (Signed)
Patient ID: Richmond CampbellDarcy J Friberg, female   DOB: 02-20-1961, 54 y.o.   MRN: [id number]  Richmond CampbellDarcy J Horacek - 54 y.o. female MRN [id number]  Date of birth: 02-20-1961    SUBJECTIVE:     Follow-up right leg radiculopathy. She has undergone one epidural steroid injection. She had a little bit of relief for about 24-48 hours but otherwise has returned to the same baseline pain, in fact she thinks her pain has increased when she's standing. If she sitting, she has little pain at all but if she does standing or any type of walking for more than about an hour she says she's miserable with pain 7-9 out of 10. The longer she is on her feet, the worse it gets. Pain radiates all the way down the right leg making feel numb, hot and prickly. The leg feels weak but she's had no falls.  She has been using the tramadol occasionally and says that is helpful but makes her feel pretty sleepy. She tapered the gabapentin up to 300 mg and did not think it helped at all. She was the impression that she should stop that when she got the epidural steroid injection so she did. ROS:     No unusual weight change, fever, sweats, chills. See history of present illness above.  PERTINENT  PMH / PSH FH / / SH:  Past Medical, Surgical, Social, and Family History Reviewed & Updated in the EMR.  Pertinent findings include:  No personal history of diabetes mellitus. History of coronary artery disease No prior history of back injury or back surgery  OBJECTIVE: BP 120/66 mmHg  Pulse 66  Physical Exam:  Vital signs are reviewed. Well-developed female no acute distress BACK: Nontender to palpation in the lumbar area. She has some muscle tenderness to palpation in the right gluteus and right lateral hip area but this does not reproduce her radicular pain. Straight leg raise does reproduce her pain at about 30. EXTREMITY: Strength  flexors and extensors at hip, knee, dorsiflexion plantarflexion all 5/5. NEURO: Intact sensation to soft touch  bilaterally equal lower extremity. DTRs at the knees bilaterally 3+ and quite brisk but symmetrical, Achilles 2+ symmetrical. VASCULAR: Dorsalis pedis  and posterior tibial  pulses 2+ bilaterally equal   IMAGING REVIEW: MRI from last month report says:L2-3: Mild right subarticular lateral recess stenosis due to disc bulge and facet arthropathy.  L3-4: Mild displacement of the left L3 nerve in the lateral extraforaminal space due to disc bulge and adjacent annular tear. Mild facet arthropathy.  L4-5: Mild displacement of the right L4 nerve in the lateral extraforaminal space, with mild bilateral foraminal stenosis, due to disc bulge, facet arthropathy, and right lateral extraforaminal disc protrusion. Borderline left subarticular lateral recess stenosis.  ASSESSMENT & PLAN:  See problem based charting & AVS for pt instructions.

## 2014-11-26 ENCOUNTER — Other Ambulatory Visit: Payer: Self-pay | Admitting: Family Medicine

## 2014-11-26 MED ORDER — PREDNISONE 20 MG PO TABS: 20.0000 mg | ORAL_TABLET | Freq: Two times a day (BID) | ORAL | Status: DC

## 2014-11-26 NOTE — Progress Notes (Signed)
Viral uri now with a lot of sinus inflammation. Hx sinus surgery x2 Will try her on some steroids for a burst. Denny LevySara Neal

## 2015-01-03 ENCOUNTER — Other Ambulatory Visit: Payer: Self-pay | Admitting: *Deleted

## 2015-01-03 MED ORDER — TRAMADOL HCL 50 MG PO TABS: 50.0000 mg | ORAL_TABLET | Freq: Three times a day (TID) | ORAL | Status: DC | PRN

## 2015-01-16 ENCOUNTER — Ambulatory Visit (INDEPENDENT_AMBULATORY_CARE_PROVIDER_SITE_OTHER): Payer: 59 | Admitting: Family Medicine

## 2015-01-16 ENCOUNTER — Encounter: Payer: Self-pay | Admitting: Family Medicine

## 2015-01-16 VITALS — BP 120/70 | Ht 64.0 in | Wt 188.0 lb

## 2015-01-16 DIAGNOSIS — G4459 Other complicated headache syndrome: Secondary | ICD-10-CM | POA: Diagnosis not present

## 2015-01-16 DIAGNOSIS — J329 Chronic sinusitis, unspecified: Secondary | ICD-10-CM

## 2015-01-16 MED ORDER — PREDNISONE 20 MG PO TABS: ORAL_TABLET | ORAL | Status: DC

## 2015-01-17 NOTE — Progress Notes (Signed)
   Subjective:    Patient ID: Miranda Baker, female    DOB: June 25, 1961, 54 y.o.   MRN: [id number]  HPI Several days of headache, stuffy sensation in her sinuses although she's been unable to blow her nose or have any significant nasal discharge. Ears feel stopped up and her head feels "swimmy". He's had a little bit of a headache at the base of her skull that radiates all the way around to her cheekbones. Has had long history of sinus difficulties. This started last Thursday or Friday and she was out of work on Friday. On Saturday she continued to have same symptoms, no fever. Her husband had some leftover prednisone and she took 10 mg of that on Saturday, felt better on Sunday and took an additional 10 mg. Today she feels pretty much okay but has a residual sense of stuffiness in her head. She's had a little bit of sense of imbalance, not true vertigo or spinning.   Review of Systems No nausea, no fever, sweats, chills. No shortness of breath, no chest pain, no edema. She's had some very mild photophobia and sense of imbalance as per history of present illness. Negative true vertigo, negative diplopia, negative blurred vision, negative change in taste, negative phonophobia    Objective:   Physical Exam Vital signs are reviewed GEN.: Well-developed female no acute distress HEENT: TMs are so retracted I can barely see any Cone of light. Oropharynx shows some cobblestoning in the posterior portion with some very mild erythema but no exudate. Neck is without lymphadenopathy. Full range of motion of the neck in flexion extension, lateral rotation.Extraocular muscles are intact. No scleral icterus. Pupils equal round reactive to light accommodation. Maxillary sinuses are nontender to palpation. LUNGS: Clear to auscultation CV: Regular rate and rhythm without murmur EXTREMITY: No edema NEURO: No nystagmus. She can stand still with arms outstretched and eyes closed. She has a normal gait. She can get up  on an exam table and back often walk with a normal gait. PSYCHIATRIC: Alert and oriented 4. Intact sense of humor. Affect is interactive.       Assessment & Plan:  #1.. Unclear what this is. It did seem to wrist on 2 mild today sterile treatment so I suspect it has something to do with her chronic sinus disease. We'll start her on sterile weight's at 20 mg a day. She's having any new or worsening symptoms she needs to call me immediately. She needs to update me by phone in 3 days. If her not having some improvement, we need to further evaluate.

## 2015-02-10 ENCOUNTER — Other Ambulatory Visit: Payer: Self-pay | Admitting: Family Medicine

## 2015-02-13 NOTE — Telephone Encounter (Signed)
Medication refilled.  Clovis PuMartin, Annalynn Centanni L, RN

## 2015-03-07 ENCOUNTER — Ambulatory Visit: Payer: 59 | Admitting: Family Medicine

## 2015-03-08 ENCOUNTER — Other Ambulatory Visit: Payer: Self-pay | Admitting: *Deleted

## 2015-03-08 MED ORDER — ALPRAZOLAM 1 MG PO TABS: ORAL_TABLET | ORAL | Status: DC

## 2015-03-17 ENCOUNTER — Other Ambulatory Visit: Payer: Self-pay | Admitting: Family Medicine

## 2015-03-17 NOTE — Telephone Encounter (Signed)
Refill completed.  Martin, Tamika L, RN  

## 2015-03-20 ENCOUNTER — Other Ambulatory Visit: Payer: Self-pay | Admitting: *Deleted

## 2015-03-20 MED ORDER — METHYLPHENIDATE HCL ER 20 MG PO TBCR: EXTENDED_RELEASE_TABLET | ORAL | Status: DC

## 2015-04-28 ENCOUNTER — Other Ambulatory Visit: Payer: Self-pay | Admitting: Family Medicine

## 2015-04-28 MED ORDER — AZITHROMYCIN 250 MG PO TABS: ORAL_TABLET | ORAL | Status: DC

## 2015-04-28 MED ORDER — PREDNISONE 20 MG PO TABS: ORAL_TABLET | ORAL | Status: DC

## 2015-04-28 MED ORDER — SULFAMETHOXAZOLE-TRIMETHOPRIM 800-160 MG PO TABS: 1.0000 | ORAL_TABLET | Freq: Two times a day (BID) | ORAL | Status: DC

## 2015-04-28 NOTE — Progress Notes (Signed)
Recurrence sinus sx now for 2 weeks Miranda Baker

## 2015-07-10 ENCOUNTER — Other Ambulatory Visit: Payer: Self-pay | Admitting: Family Medicine

## 2015-07-31 ENCOUNTER — Other Ambulatory Visit: Payer: Self-pay | Admitting: Family Medicine

## 2015-07-31 MED ORDER — CLOPIDOGREL BISULFATE 75 MG PO TABS: 75.0000 mg | ORAL_TABLET | Freq: Every morning | ORAL | Status: DC

## 2015-07-31 MED ORDER — PREDNISONE 20 MG PO TABS: ORAL_TABLET | ORAL | Status: DC

## 2015-07-31 MED ORDER — CITALOPRAM HYDROBROMIDE 40 MG PO TABS: ORAL_TABLET | ORAL | Status: DC

## 2015-07-31 MED ORDER — METOPROLOL TARTRATE 25 MG PO TABS: 25.0000 mg | ORAL_TABLET | Freq: Every day | ORAL | Status: DC

## 2015-07-31 MED ORDER — NITROGLYCERIN 0.4 MG SL SUBL: 0.4000 mg | SUBLINGUAL_TABLET | SUBLINGUAL | Status: AC | PRN

## 2015-07-31 MED ORDER — BENICAR HCT 40-12.5 MG PO TABS: 1.0000 | ORAL_TABLET | Freq: Every morning | ORAL | Status: DC

## 2015-07-31 MED ORDER — EZETIMIBE 10 MG PO TABS: 10.0000 mg | ORAL_TABLET | Freq: Every day | ORAL | Status: DC

## 2015-07-31 MED ORDER — OLOPATADINE HCL 0.2 % OP SOLN: OPHTHALMIC | Status: DC

## 2015-07-31 MED ORDER — MONTELUKAST SODIUM 10 MG PO TABS: 10.0000 mg | ORAL_TABLET | Freq: Every day | ORAL | Status: DC

## 2015-07-31 MED ORDER — ATORVASTATIN CALCIUM 80 MG PO TABS: 80.0000 mg | ORAL_TABLET | Freq: Every morning | ORAL | Status: DC

## 2015-07-31 NOTE — Progress Notes (Signed)
Needs refills Also having another sinus inflammation---this time with sore throat and laryngitis. No fever.Has had recurrent sinus issues. Traveling. I will do her refills she needs and for sinus issue --I think related to viral laryngitis and weill do short steroid burst as we have in past--doubt abx needed---due to sinus surgery ant iinfection even viral can cause these sx as I explained to her,-if not better she will be seen Denny LevySara Nataliyah Packham

## 2015-08-02 ENCOUNTER — Other Ambulatory Visit: Payer: Self-pay | Admitting: Family Medicine

## 2015-08-09 ENCOUNTER — Other Ambulatory Visit: Payer: Self-pay | Admitting: Family Medicine

## 2015-08-09 MED ORDER — TINIDAZOLE 500 MG PO TABS: 2.0000 g | ORAL_TABLET | Freq: Once | ORAL | Status: DC

## 2015-08-09 NOTE — Progress Notes (Signed)
Pt called w recurrence of sx c/w giardia 539foul floating stools, lots of gas. Has had before many years ago, No fever. Will try single dose tinidazole and if not imprved or new /worse sx in next few days will come see me in clinic. Denny LevySara Mychael Soots

## 2015-09-01 ENCOUNTER — Encounter: Payer: Self-pay | Admitting: Family Medicine

## 2015-09-01 ENCOUNTER — Ambulatory Visit (INDEPENDENT_AMBULATORY_CARE_PROVIDER_SITE_OTHER): Payer: BLUE CROSS/BLUE SHIELD | Admitting: Family Medicine

## 2015-09-01 VITALS — BP 126/52 | Ht 64.0 in | Wt 185.0 lb

## 2015-09-01 DIAGNOSIS — K219 Gastro-esophageal reflux disease without esophagitis: Secondary | ICD-10-CM | POA: Diagnosis not present

## 2015-09-01 MED ORDER — TRAMADOL HCL 50 MG PO TABS: ORAL_TABLET | ORAL | Status: DC

## 2015-09-01 MED ORDER — SUCRALFATE 1 G PO TABS
1.0000 g | ORAL_TABLET | Freq: Three times a day (TID) | ORAL | Status: DC
Start: 2015-09-01 — End: 2017-02-25

## 2015-09-01 MED FILL — traMADol HCL 50 MG TABS: 50 | 30 days supply | Qty: 120 | Fill #0

## 2015-09-01 MED FILL — SUCRALFATE 1 GM TABLET: 1 | 30 days supply | Qty: 120 | Fill #0 | Status: TO

## 2015-09-04 NOTE — Progress Notes (Signed)
   Subjective:    Patient ID: Miranda Baker, female    DOB: March 13, 1961, 55 y.o.   MRN: [id number]  HPI  Having episodes of chest pain  For the last month or so has been having burning, sharp pains  Multiple times of the day located in her upper abdomen and central chest. It feels a little bit like heartburn but much more severe. She has no nausea or shortness of breath with this. She can also get this same type of sensation sometimes if she leans over for a while like if she's trying to tie her shoe. She has continued on her esophageal reflux medication. She does not note any association with activity , it can occur when she's up moving around or just sitting watching television. It does sometimes get a little worse with eating , particularly initiating eating. She's had no cough. Her exercise tolerance is unchanged. Denies vomiting , no change in bowel habits.  No lower extremity edema.  PERTINENT  PMH / PSH: I have reviewed the patient's medications, allergies, past medical and surgical history. Pertinent findings that relate to today's visit / issues include: Coronary artery disease with stent placement 2011, proximal D1 EGD 2014 by Dr. Dulce Sellar showed antral gastritis but no esophagitis.    Review of Systems  no unexpected weight change. See history of present illness above.    Objective:   Physical Exam  Vital signs reviewed. GENERAL: Well-developed, well-nourished, no acute distress. CARDIOVASCULAR: Regular rate and rhythm no murmur gallop or rub LUNGS: Clear to auscultation bilaterally, no rales or wheeze. ABDOMEN: Soft positive bowel sounds.  Nontender, no rebound , no guarding. No masses noted.  BACK : No CVA tenderness NEURO: No gross focal neurological deficits. MSK: Movement of extremity x 4.  Normal gait. Rises easily from chair and can get on exam table without any problem.  NECK: No JVD, no thyromegaly         Assessment & Plan:

## 2015-09-04 NOTE — Assessment & Plan Note (Addendum)
Sounds most consistent with some type of esophageal issue, spasm or break through reflux, ( less likely possibly stricture).. Her EGD in 2014 did not really show anything concerning. She has been under a lot of stress recently and I wonder if this is contributing. I think she needs to be evaluated  By gastroenterology. In the interim I will place her on Carafate in addition to her proton pump inhibitor. Should the EGD show nothing significant, then I think we need to have her seen again by cardiology for possible stress test. It does not sound cardiac but she does have significant cardiovascular history. She will follow-up with me after she sees the gastroenterologist. Maryclare Labrador try to get her in with them in the next 1-2 weeks. To call me immediately if anything new occurs or she has worsening symptoms.  I cannot totally rule out cardiovascular issues and we discussed this. We both agree that this is most likely related to stomach so we will start with that workup. Greater than 50% of our 25 minute office visit was spent in counseling and education regarding these issues.

## 2015-09-06 MED FILL — ZETIA 10 MG TABLET: 10 | 30 days supply | Qty: 30 | Fill #2

## 2015-09-06 MED FILL — CITALOPRAM HBR 40 MG TABLET: 40 | 90 days supply | Qty: 90 | Fill #3

## 2015-09-07 ENCOUNTER — Other Ambulatory Visit: Payer: Self-pay | Admitting: Family Medicine

## 2015-09-07 MED ORDER — METHYLPHENIDATE HCL ER 20 MG PO TBCR: EXTENDED_RELEASE_TABLET | ORAL | Status: DC

## 2015-09-08 MED FILL — METADATE ER 20 MG TABLET: 20 | 90 days supply | Qty: 180 | Fill #0

## 2015-10-02 ENCOUNTER — Other Ambulatory Visit: Payer: Self-pay | Admitting: *Deleted

## 2015-10-02 MED ORDER — ALPRAZOLAM 1 MG PO TABS: ORAL_TABLET | ORAL | Status: DC

## 2015-10-02 MED FILL — ZETIA 10 MG TABLET: 10 | 30 days supply | Qty: 30 | Fill #3

## 2015-10-02 MED FILL — BENICAR HCT 40-12.5 MG TAB: 40-12.5 | 90 days supply | Qty: 90 | Fill #2 | Status: TO

## 2015-10-02 MED FILL — ALPRAZolam 1 MG TABS: 1 | 30 days supply | Qty: 90 | Fill #0

## 2015-11-09 MED FILL — ZETIA 10 MG TABLET: 10 | 90 days supply | Qty: 90 | Fill #0 | Status: TO

## 2015-11-09 MED FILL — METOPROLOL TARTRATE 25 MG T: 25 | 90 days supply | Qty: 90 | Fill #1 | Status: TO

## 2015-12-13 DIAGNOSIS — E7800 Pure hypercholesterolemia, unspecified: Secondary | ICD-10-CM | POA: Diagnosis not present

## 2015-12-13 DIAGNOSIS — I10 Essential (primary) hypertension: Secondary | ICD-10-CM | POA: Diagnosis not present

## 2015-12-20 MED FILL — traMADol HCL 50 MG TABS: 50 | 30 days supply | Qty: 120 | Fill #1 | Status: TO

## 2015-12-20 MED FILL — CITALOPRAM HBR 40 MG TABLET: 40 | 90 days supply | Qty: 90 | Fill #0 | Status: TO

## 2016-01-19 ENCOUNTER — Other Ambulatory Visit: Payer: Self-pay | Admitting: *Deleted

## 2016-01-19 MED ORDER — ALPRAZOLAM 1 MG PO TABS: ORAL_TABLET | ORAL | Status: DC

## 2016-02-14 ENCOUNTER — Other Ambulatory Visit: Payer: Self-pay | Admitting: *Deleted

## 2016-02-14 MED ORDER — FLUCONAZOLE 150 MG PO TABS: 150.0000 mg | ORAL_TABLET | Freq: Every day | ORAL | Status: DC

## 2016-02-27 ENCOUNTER — Other Ambulatory Visit: Payer: Self-pay | Admitting: *Deleted

## 2016-02-27 ENCOUNTER — Other Ambulatory Visit: Payer: Self-pay | Admitting: Family Medicine

## 2016-02-27 MED ORDER — OLMESARTAN MEDOXOMIL-HCTZ 40-12.5 MG PO TABS: ORAL_TABLET | ORAL | 3 refills | Status: DC

## 2016-02-27 MED ORDER — BENICAR HCT 40-12.5 MG PO TABS: 1.0000 | ORAL_TABLET | Freq: Every morning | ORAL | 4 refills | Status: DC

## 2016-04-17 DIAGNOSIS — M5416 Radiculopathy, lumbar region: Secondary | ICD-10-CM | POA: Diagnosis not present

## 2016-04-17 DIAGNOSIS — M4316 Spondylolisthesis, lumbar region: Secondary | ICD-10-CM | POA: Diagnosis not present

## 2016-04-17 DIAGNOSIS — M545 Low back pain: Secondary | ICD-10-CM | POA: Diagnosis not present

## 2016-04-17 DIAGNOSIS — M5136 Other intervertebral disc degeneration, lumbar region: Secondary | ICD-10-CM | POA: Diagnosis not present

## 2016-04-17 DIAGNOSIS — M549 Dorsalgia, unspecified: Secondary | ICD-10-CM | POA: Diagnosis not present

## 2016-04-30 DIAGNOSIS — M545 Low back pain: Secondary | ICD-10-CM | POA: Diagnosis not present

## 2016-04-30 DIAGNOSIS — M2560 Stiffness of unspecified joint, not elsewhere classified: Secondary | ICD-10-CM | POA: Diagnosis not present

## 2016-04-30 DIAGNOSIS — M6250 Muscle wasting and atrophy, not elsewhere classified, unspecified site: Secondary | ICD-10-CM | POA: Diagnosis not present

## 2016-04-30 DIAGNOSIS — I10 Essential (primary) hypertension: Secondary | ICD-10-CM | POA: Diagnosis not present

## 2016-05-01 DIAGNOSIS — M2560 Stiffness of unspecified joint, not elsewhere classified: Secondary | ICD-10-CM | POA: Diagnosis not present

## 2016-05-01 DIAGNOSIS — I10 Essential (primary) hypertension: Secondary | ICD-10-CM | POA: Diagnosis not present

## 2016-05-01 DIAGNOSIS — M6250 Muscle wasting and atrophy, not elsewhere classified, unspecified site: Secondary | ICD-10-CM | POA: Diagnosis not present

## 2016-05-01 DIAGNOSIS — M545 Low back pain: Secondary | ICD-10-CM | POA: Diagnosis not present

## 2016-05-02 ENCOUNTER — Other Ambulatory Visit: Payer: Self-pay | Admitting: Gastroenterology

## 2016-05-02 DIAGNOSIS — K219 Gastro-esophageal reflux disease without esophagitis: Secondary | ICD-10-CM | POA: Diagnosis not present

## 2016-05-02 DIAGNOSIS — R6881 Early satiety: Secondary | ICD-10-CM | POA: Diagnosis not present

## 2016-05-02 DIAGNOSIS — R1310 Dysphagia, unspecified: Secondary | ICD-10-CM | POA: Diagnosis not present

## 2016-05-02 DIAGNOSIS — IMO0001 Reserved for inherently not codable concepts without codable children: Secondary | ICD-10-CM

## 2016-05-07 ENCOUNTER — Ambulatory Visit
Admission: RE | Admit: 2016-05-07 | Discharge: 2016-05-07 | Disposition: A | Discharge status: Home / Self Care | Payer: BLUE CROSS/BLUE SHIELD | Source: Ambulatory Visit | Attending: Gastroenterology | Admitting: Gastroenterology

## 2016-05-07 ENCOUNTER — Other Ambulatory Visit: Payer: Self-pay | Admitting: Gastroenterology

## 2016-05-07 DIAGNOSIS — R140 Abdominal distension (gaseous): Secondary | ICD-10-CM

## 2016-05-07 DIAGNOSIS — R6881 Early satiety: Secondary | ICD-10-CM

## 2016-05-07 DIAGNOSIS — K219 Gastro-esophageal reflux disease without esophagitis: Secondary | ICD-10-CM | POA: Diagnosis not present

## 2016-05-07 DIAGNOSIS — R1310 Dysphagia, unspecified: Secondary | ICD-10-CM

## 2016-05-08 DIAGNOSIS — M545 Low back pain: Secondary | ICD-10-CM | POA: Diagnosis not present

## 2016-05-08 DIAGNOSIS — I10 Essential (primary) hypertension: Secondary | ICD-10-CM | POA: Diagnosis not present

## 2016-05-08 DIAGNOSIS — M2560 Stiffness of unspecified joint, not elsewhere classified: Secondary | ICD-10-CM | POA: Diagnosis not present

## 2016-05-08 DIAGNOSIS — M6250 Muscle wasting and atrophy, not elsewhere classified, unspecified site: Secondary | ICD-10-CM | POA: Diagnosis not present

## 2016-05-08 MED FILL — PANTOPRAZOLE SOD DR 40 MG T: 40 | 90 days supply | Qty: 180 | Fill #1

## 2016-05-09 DIAGNOSIS — M545 Low back pain: Secondary | ICD-10-CM | POA: Diagnosis not present

## 2016-05-09 DIAGNOSIS — M6250 Muscle wasting and atrophy, not elsewhere classified, unspecified site: Secondary | ICD-10-CM | POA: Diagnosis not present

## 2016-05-09 DIAGNOSIS — M2560 Stiffness of unspecified joint, not elsewhere classified: Secondary | ICD-10-CM | POA: Diagnosis not present

## 2016-05-09 DIAGNOSIS — I10 Essential (primary) hypertension: Secondary | ICD-10-CM | POA: Diagnosis not present

## 2016-05-13 DIAGNOSIS — I10 Essential (primary) hypertension: Secondary | ICD-10-CM | POA: Diagnosis not present

## 2016-05-13 DIAGNOSIS — M545 Low back pain: Secondary | ICD-10-CM | POA: Diagnosis not present

## 2016-05-13 DIAGNOSIS — M6250 Muscle wasting and atrophy, not elsewhere classified, unspecified site: Secondary | ICD-10-CM | POA: Diagnosis not present

## 2016-05-13 DIAGNOSIS — M2560 Stiffness of unspecified joint, not elsewhere classified: Secondary | ICD-10-CM | POA: Diagnosis not present

## 2016-05-15 DIAGNOSIS — I10 Essential (primary) hypertension: Secondary | ICD-10-CM | POA: Diagnosis not present

## 2016-05-15 DIAGNOSIS — M2560 Stiffness of unspecified joint, not elsewhere classified: Secondary | ICD-10-CM | POA: Diagnosis not present

## 2016-05-15 DIAGNOSIS — M6250 Muscle wasting and atrophy, not elsewhere classified, unspecified site: Secondary | ICD-10-CM | POA: Diagnosis not present

## 2016-05-15 DIAGNOSIS — M545 Low back pain: Secondary | ICD-10-CM | POA: Diagnosis not present

## 2016-05-16 DIAGNOSIS — M2560 Stiffness of unspecified joint, not elsewhere classified: Secondary | ICD-10-CM | POA: Diagnosis not present

## 2016-05-16 DIAGNOSIS — M6250 Muscle wasting and atrophy, not elsewhere classified, unspecified site: Secondary | ICD-10-CM | POA: Diagnosis not present

## 2016-05-16 DIAGNOSIS — M545 Low back pain: Secondary | ICD-10-CM | POA: Diagnosis not present

## 2016-05-16 DIAGNOSIS — I10 Essential (primary) hypertension: Secondary | ICD-10-CM | POA: Diagnosis not present

## 2016-05-20 DIAGNOSIS — I10 Essential (primary) hypertension: Secondary | ICD-10-CM | POA: Diagnosis not present

## 2016-05-20 DIAGNOSIS — M6250 Muscle wasting and atrophy, not elsewhere classified, unspecified site: Secondary | ICD-10-CM | POA: Diagnosis not present

## 2016-05-20 DIAGNOSIS — M2560 Stiffness of unspecified joint, not elsewhere classified: Secondary | ICD-10-CM | POA: Diagnosis not present

## 2016-05-20 DIAGNOSIS — M545 Low back pain: Secondary | ICD-10-CM | POA: Diagnosis not present

## 2016-05-28 ENCOUNTER — Other Ambulatory Visit: Payer: Self-pay | Admitting: *Deleted

## 2016-05-28 MED FILL — DUREZOL 0.05% EYE DROPS: 0.05 | 25 days supply | Qty: 5 | Fill #1

## 2016-05-30 DIAGNOSIS — H20021 Recurrent acute iridocyclitis, right eye: Secondary | ICD-10-CM | POA: Diagnosis not present

## 2016-06-17 DIAGNOSIS — M6250 Muscle wasting and atrophy, not elsewhere classified, unspecified site: Secondary | ICD-10-CM | POA: Diagnosis not present

## 2016-06-17 DIAGNOSIS — I10 Essential (primary) hypertension: Secondary | ICD-10-CM | POA: Diagnosis not present

## 2016-06-17 DIAGNOSIS — H20021 Recurrent acute iridocyclitis, right eye: Secondary | ICD-10-CM | POA: Diagnosis not present

## 2016-06-17 DIAGNOSIS — M545 Low back pain: Secondary | ICD-10-CM | POA: Diagnosis not present

## 2016-06-17 DIAGNOSIS — M2560 Stiffness of unspecified joint, not elsewhere classified: Secondary | ICD-10-CM | POA: Diagnosis not present

## 2016-06-24 DIAGNOSIS — M545 Low back pain: Secondary | ICD-10-CM | POA: Diagnosis not present

## 2016-06-24 DIAGNOSIS — I10 Essential (primary) hypertension: Secondary | ICD-10-CM | POA: Diagnosis not present

## 2016-06-24 DIAGNOSIS — M6250 Muscle wasting and atrophy, not elsewhere classified, unspecified site: Secondary | ICD-10-CM | POA: Diagnosis not present

## 2016-06-24 DIAGNOSIS — M2560 Stiffness of unspecified joint, not elsewhere classified: Secondary | ICD-10-CM | POA: Diagnosis not present

## 2016-06-25 DIAGNOSIS — M6250 Muscle wasting and atrophy, not elsewhere classified, unspecified site: Secondary | ICD-10-CM | POA: Diagnosis not present

## 2016-06-25 DIAGNOSIS — M545 Low back pain: Secondary | ICD-10-CM | POA: Diagnosis not present

## 2016-06-25 DIAGNOSIS — I10 Essential (primary) hypertension: Secondary | ICD-10-CM | POA: Diagnosis not present

## 2016-06-25 DIAGNOSIS — M2560 Stiffness of unspecified joint, not elsewhere classified: Secondary | ICD-10-CM | POA: Diagnosis not present

## 2016-07-24 ENCOUNTER — Other Ambulatory Visit: Payer: Self-pay | Admitting: Family Medicine

## 2016-07-24 NOTE — Telephone Encounter (Signed)
Dear White Team Please call in her alprazolam refills THANKS! Jiselle Sheu  

## 2016-08-07 ENCOUNTER — Other Ambulatory Visit: Payer: Self-pay | Admitting: Family Medicine

## 2016-08-07 MED ORDER — LEVOFLOXACIN 500 MG PO TABS: 500.0000 mg | ORAL_TABLET | Freq: Every day | ORAL | 0 refills | Status: DC

## 2016-08-07 MED ORDER — PREDNISONE 20 MG PO TABS: ORAL_TABLET | ORAL | 1 refills | Status: DC

## 2016-08-07 NOTE — Progress Notes (Signed)
Sinus pain and headache---has been in bed for 2 days. Feels like typical sinus infection for her PMH sig for sinus surgery, multiple recurrent sinus infections, laast one last year Will treat with abx and steroids, if not getting better will need to be seen.

## 2016-08-15 DIAGNOSIS — J069 Acute upper respiratory infection, unspecified: Secondary | ICD-10-CM | POA: Diagnosis not present

## 2016-08-15 DIAGNOSIS — B9789 Other viral agents as the cause of diseases classified elsewhere: Secondary | ICD-10-CM | POA: Diagnosis not present

## 2016-08-23 ENCOUNTER — Other Ambulatory Visit: Payer: Self-pay | Admitting: Family Medicine

## 2016-08-23 MED ORDER — ATORVASTATIN CALCIUM 80 MG PO TABS
80.0000 mg | ORAL_TABLET | Freq: Every morning | ORAL | 3 refills | Status: DC
Start: 2016-08-23 — End: 2018-10-23

## 2016-08-23 MED ORDER — METOPROLOL TARTRATE 25 MG PO TABS: 25.0000 mg | ORAL_TABLET | Freq: Every day | ORAL | 3 refills | Status: DC

## 2016-08-23 MED ORDER — PANTOPRAZOLE SODIUM 40 MG PO TBEC: 40.0000 mg | DELAYED_RELEASE_TABLET | Freq: Two times a day (BID) | ORAL | 3 refills | Status: DC

## 2016-08-26 DIAGNOSIS — H16213 Exposure keratoconjunctivitis, bilateral: Secondary | ICD-10-CM | POA: Diagnosis not present

## 2016-08-26 DIAGNOSIS — H16223 Keratoconjunctivitis sicca, not specified as Sjogren's, bilateral: Secondary | ICD-10-CM | POA: Diagnosis not present

## 2016-08-26 DIAGNOSIS — H524 Presbyopia: Secondary | ICD-10-CM | POA: Diagnosis not present

## 2016-08-26 DIAGNOSIS — H52203 Unspecified astigmatism, bilateral: Secondary | ICD-10-CM | POA: Diagnosis not present

## 2016-08-29 ENCOUNTER — Ambulatory Visit
Admission: RE | Admit: 2016-08-29 | Discharge: 2016-08-29 | Disposition: A | Discharge status: Home / Self Care | Payer: BLUE CROSS/BLUE SHIELD | Source: Ambulatory Visit | Attending: Family Medicine | Admitting: Family Medicine

## 2016-08-29 ENCOUNTER — Other Ambulatory Visit: Payer: Self-pay | Admitting: *Deleted

## 2016-08-29 ENCOUNTER — Other Ambulatory Visit: Payer: Self-pay | Admitting: Family Medicine

## 2016-08-29 DIAGNOSIS — R0602 Shortness of breath: Secondary | ICD-10-CM

## 2016-08-29 DIAGNOSIS — IMO0001 Reserved for inherently not codable concepts without codable children: Secondary | ICD-10-CM

## 2016-08-29 DIAGNOSIS — R059 Cough, unspecified: Secondary | ICD-10-CM

## 2016-08-29 DIAGNOSIS — J00 Acute nasopharyngitis [common cold]: Secondary | ICD-10-CM | POA: Diagnosis not present

## 2016-08-29 DIAGNOSIS — R05 Cough: Secondary | ICD-10-CM | POA: Diagnosis not present

## 2016-08-29 MED ORDER — PREDNISONE 20 MG PO TABS: ORAL_TABLET | ORAL | 0 refills | Status: DC

## 2016-08-29 MED FILL — predniSONE 20 MG TABS: 20 | 5 days supply | Qty: 5 | Fill #0

## 2016-09-04 ENCOUNTER — Other Ambulatory Visit: Payer: Self-pay | Admitting: Family Medicine

## 2016-09-04 MED ORDER — ACETAMINOPHEN-CODEINE 300-30 MG PO TABS: ORAL_TABLET | ORAL | 0 refills | Status: DC

## 2016-09-16 ENCOUNTER — Other Ambulatory Visit: Payer: Self-pay | Admitting: Family Medicine

## 2016-09-16 DIAGNOSIS — I2510 Atherosclerotic heart disease of native coronary artery without angina pectoris: Secondary | ICD-10-CM | POA: Diagnosis not present

## 2016-09-16 DIAGNOSIS — E7800 Pure hypercholesterolemia, unspecified: Secondary | ICD-10-CM | POA: Diagnosis not present

## 2016-09-16 DIAGNOSIS — I10 Essential (primary) hypertension: Secondary | ICD-10-CM | POA: Diagnosis not present

## 2016-09-30 DIAGNOSIS — Z1211 Encounter for screening for malignant neoplasm of colon: Secondary | ICD-10-CM | POA: Diagnosis not present

## 2016-09-30 DIAGNOSIS — R6881 Early satiety: Secondary | ICD-10-CM | POA: Diagnosis not present

## 2016-09-30 DIAGNOSIS — R109 Unspecified abdominal pain: Secondary | ICD-10-CM | POA: Diagnosis not present

## 2016-10-09 ENCOUNTER — Other Ambulatory Visit: Payer: Self-pay | Admitting: Family Medicine

## 2016-10-17 ENCOUNTER — Other Ambulatory Visit: Payer: Self-pay | Admitting: Family Medicine

## 2016-11-03 DIAGNOSIS — S8261XA Displaced fracture of lateral malleolus of right fibula, initial encounter for closed fracture: Secondary | ICD-10-CM | POA: Diagnosis not present

## 2016-11-03 DIAGNOSIS — M25572 Pain in left ankle and joints of left foot: Secondary | ICD-10-CM | POA: Diagnosis not present

## 2016-11-03 DIAGNOSIS — Y33XXXA Other specified events, undetermined intent, initial encounter: Secondary | ICD-10-CM | POA: Diagnosis not present

## 2016-11-04 ENCOUNTER — Other Ambulatory Visit: Payer: Self-pay | Admitting: Family Medicine

## 2016-11-04 DIAGNOSIS — S82401A Unspecified fracture of shaft of right fibula, initial encounter for closed fracture: Secondary | ICD-10-CM | POA: Insufficient documentation

## 2016-11-04 DIAGNOSIS — S82424A Nondisplaced transverse fracture of shaft of right fibula, initial encounter for closed fracture: Secondary | ICD-10-CM

## 2016-11-04 NOTE — Progress Notes (Signed)
Larey Seat and fx RIGHT ankle\ Seen at [facility]

## 2016-11-06 ENCOUNTER — Ambulatory Visit
Admission: RE | Admit: 2016-11-06 | Discharge: 2016-11-06 | Disposition: A | Discharge status: Home / Self Care | Payer: BLUE CROSS/BLUE SHIELD | Source: Ambulatory Visit | Attending: Family Medicine | Admitting: Family Medicine

## 2016-11-06 DIAGNOSIS — S82831A Other fracture of upper and lower end of right fibula, initial encounter for closed fracture: Secondary | ICD-10-CM | POA: Diagnosis not present

## 2016-11-06 DIAGNOSIS — S82424A Nondisplaced transverse fracture of shaft of right fibula, initial encounter for closed fracture: Secondary | ICD-10-CM

## 2016-11-08 ENCOUNTER — Encounter: Payer: Self-pay | Admitting: Family Medicine

## 2016-11-08 ENCOUNTER — Ambulatory Visit (INDEPENDENT_AMBULATORY_CARE_PROVIDER_SITE_OTHER): Payer: BLUE CROSS/BLUE SHIELD | Admitting: Family Medicine

## 2016-11-08 DIAGNOSIS — S82424A Nondisplaced transverse fracture of shaft of right fibula, initial encounter for closed fracture: Secondary | ICD-10-CM

## 2016-11-11 NOTE — Progress Notes (Signed)
  Miranda Baker - 56 y.o. female MRN [id number]  Date of birth: May 20, 1961    SUBJECTIVE:      Chief Complaint:/ HPI:   Date of injury 11/03/2016 Was having Easter brunch in a restaurant with her family, got up to take her grandchildren to the bathroom and slipped in some water fell, immediately had acute onset of RIGHT ankle pain. Was seen at the emergency department placed in a Cam Walker boot. They did x-rays. Since then she's been weightbearing with a lot of pain. She's had a lot of swelling.   ROS:     No new redness or unusual warmth of the ankle. No unusual weight change. No fever. No numbness or tingling in her toes.  PERTINENT  PMH / PSH FH / / SH:  Past Medical, Surgical, Social, and Family History Reviewed & Updated in the EMR.  Pertinent findings include:  Nonsmoker No personal history diabetes mellitus No prior history of ankle injury or surgery  OBJECTIVE: BP 118/74   Ht  (1.626 m)   Wt 183 lb (83 kg)   BMI 31.41 kg/m   Physical Exam:  Vital signs are reviewed. GEN.: Well-developed female no acute distress ANKLE: Right. Soft tissue swelling. Some mild ecchymoses. Soft tissue swelling is most prominent over the lateral malleoli are area and she's tender to palpation here. She has intact dorsiflexion plantar flexion. VASCULAR: Dorsalis pedis and posterior tibial pulses are 2+ bilaterally. Capillary refill is normal NEURO: Intact sensation in the foot soft touch, sharp touch, proprioception.  IMAGING: I reviewed the report from x-rays obtained at [facility] emergency department and I reviewed the x-rays we obtained yesterday. Shows a distal fibula fracture, Weber A. Ortis is intact  ASSESSMENT & PLAN:  See problem based charting & AVS for pt instructions.

## 2016-11-11 NOTE — Assessment & Plan Note (Signed)
Continued Cam Walker the next 2 weeks. I did ultrasound images today to follow it next visit so we don't need repeat the x-ray. I will give her an Aircast to wear while she sleeps is the boot is bothering her. I see her back, we'll start her on some range of motion exercises for the ankle and plan for rehabilitation program to follow. She's to stop the NSAIDs and use her pre-existing prescription of Tylenol No. 3 for pain.

## 2016-12-02 ENCOUNTER — Encounter: Payer: Self-pay | Admitting: Family Medicine

## 2016-12-02 ENCOUNTER — Ambulatory Visit (INDEPENDENT_AMBULATORY_CARE_PROVIDER_SITE_OTHER): Payer: BLUE CROSS/BLUE SHIELD | Admitting: Family Medicine

## 2016-12-02 VITALS — BP 117/52 | Ht 64.0 in | Wt 183.0 lb

## 2016-12-02 DIAGNOSIS — S82424D Nondisplaced transverse fracture of shaft of right fibula, subsequent encounter for closed fracture with routine healing: Secondary | ICD-10-CM | POA: Diagnosis not present

## 2016-12-02 DIAGNOSIS — M5416 Radiculopathy, lumbar region: Secondary | ICD-10-CM

## 2016-12-03 ENCOUNTER — Other Ambulatory Visit: Payer: Self-pay | Admitting: Family Medicine

## 2016-12-03 DIAGNOSIS — M5416 Radiculopathy, lumbar region: Secondary | ICD-10-CM

## 2016-12-03 NOTE — Assessment & Plan Note (Signed)
Reviewed old notes/imaging and procedure data--will set up for ESI. If new or worsening sx, she will let me kniow

## 2016-12-03 NOTE — Progress Notes (Signed)
    CHIEF COMPLAINT / HPI:   f/u right fibular fracture---has transitioned to air cast and then started having foot pain so she switched to a post op shoe yesterday which seem to help foot some. Ankle 75% better. Still limping, 2. Previously (2016) received ESI for right radiculopathy---walking with the boot has aggravated her back. Wants o know if she can have a second injection. Pain is 4/10 all of the time---has had 2 episodes (while care taking family members) where it has "locked up" causing her to be in bed for a day each time. Pain not really radiating past upper thigh but in same area as previously. Worse with long standing. Better wit rest. Husband just had double lung transplant. Hopes he will be coming home in next week. He is doing well. Her stress level has been high  REVIEW OF SYSTEMS:  Small amount of weight loss (< 5pounds). Back and ankle/foot pain as per HPI No fever. No extremity numbness. No leg weakness. No incontinence.  OBJECTIVE:  Vital signs are reviewed.  WD WN NAD ANKLE RIGHT: mildly ttp at distal fibula, no defect. Can stand single stance with some pain on weight bearing. FOOT: right---diffusely ttp dorsum over 3-4-5 MT areas, no defect SKIN no bruising or erythema. No unusual warmth. [place]: Distal fibula reveals small amount of bony callous formation over area of fracture with increased doppler activity (appropriate). Dorsum of fott examined and there is a small amount of fluid in the tendon sheaths of flexor tendons 3-4-5. Tendons are intact. The metatarsals 2-5 are viewed in entirety and no fracture is noted. BACK soft tissue and muscle tenderness right lumbar area. No defect. No pain with percussion vertebra. Limited flexion at hips secondary to muscle tightness and spasm. EXT: lower extremity strength hip/knee/ ankle 5/5 bilaterally.  ASSESSMENT / PLAN: 1. Foot pain--I think mild strain in tendons for dorsiflexion from wearing boot/ shoe and having abnormal  gait. She feel comfortable in post op shoe so I added an arch strap and a green insole which further improved her pain. WBAT. Re the fibular fracture---it is healing nicely. No specific need for ASO or air cast at this point other than comfort and sounds like it was causing foot pain so transition to post op shoe until foot has resolved. F/u via phone or ov in 2 weeks to start rehab exercises for ankle. Continue ROM exercises.

## 2016-12-09 ENCOUNTER — Ambulatory Visit
Admission: RE | Admit: 2016-12-09 | Discharge: 2016-12-09 | Disposition: A | Discharge status: Home / Self Care | Payer: BLUE CROSS/BLUE SHIELD | Source: Ambulatory Visit | Attending: Family Medicine | Admitting: Family Medicine

## 2016-12-09 DIAGNOSIS — M5416 Radiculopathy, lumbar region: Secondary | ICD-10-CM

## 2016-12-09 DIAGNOSIS — M47817 Spondylosis without myelopathy or radiculopathy, lumbosacral region: Secondary | ICD-10-CM | POA: Diagnosis not present

## 2016-12-09 MED ORDER — IOPAMIDOL (ISOVUE-M 200) INJECTION 41%
1.0000 mL | Freq: Once | INTRAMUSCULAR | Status: AC
  Administered 2016-12-09: 1 mL via EPIDURAL

## 2016-12-09 MED ORDER — METHYLPREDNISOLONE ACETATE 40 MG/ML INJ SUSP (RADIOLOG
120.0000 mg | Freq: Once | INTRAMUSCULAR | Status: AC
  Administered 2016-12-09: 120 mg via EPIDURAL

## 2016-12-09 NOTE — Discharge Instructions (Signed)

## 2017-01-01 ENCOUNTER — Other Ambulatory Visit (HOSPITAL_COMMUNITY): Payer: Self-pay | Admitting: Gastroenterology

## 2017-01-01 DIAGNOSIS — R109 Unspecified abdominal pain: Secondary | ICD-10-CM

## 2017-01-01 DIAGNOSIS — R6881 Early satiety: Secondary | ICD-10-CM

## 2017-01-02 ENCOUNTER — Other Ambulatory Visit: Payer: Self-pay | Admitting: Family Medicine

## 2017-01-03 ENCOUNTER — Other Ambulatory Visit: Payer: Self-pay | Admitting: *Deleted

## 2017-01-03 MED ORDER — ALPRAZOLAM 1 MG PO TABS: ORAL_TABLET | ORAL | 5 refills | Status: DC

## 2017-01-08 ENCOUNTER — Other Ambulatory Visit: Payer: Self-pay | Admitting: *Deleted

## 2017-01-08 MED ORDER — ALPRAZOLAM 1 MG PO TABS: ORAL_TABLET | ORAL | 5 refills | Status: DC

## 2017-01-16 ENCOUNTER — Encounter (HOSPITAL_COMMUNITY)
Admission: RE | Admit: 2017-01-16 | Discharge: 2017-01-16 | Disposition: A | Discharge status: Home / Self Care | Payer: BLUE CROSS/BLUE SHIELD | Source: Ambulatory Visit | Attending: Gastroenterology | Admitting: Gastroenterology

## 2017-01-16 DIAGNOSIS — R109 Unspecified abdominal pain: Secondary | ICD-10-CM | POA: Diagnosis not present

## 2017-01-16 DIAGNOSIS — R6881 Early satiety: Secondary | ICD-10-CM

## 2017-01-16 MED ORDER — TECHNETIUM TC 99M SULFUR COLLOID
2.2000 | Freq: Once | INTRAVENOUS | Status: AC | PRN
  Administered 2017-01-16: 2.2 via INTRAVENOUS

## 2017-01-28 DIAGNOSIS — E7800 Pure hypercholesterolemia, unspecified: Secondary | ICD-10-CM | POA: Diagnosis not present

## 2017-01-28 DIAGNOSIS — I10 Essential (primary) hypertension: Secondary | ICD-10-CM | POA: Diagnosis not present

## 2017-01-28 DIAGNOSIS — I2510 Atherosclerotic heart disease of native coronary artery without angina pectoris: Secondary | ICD-10-CM | POA: Diagnosis not present

## 2017-02-13 ENCOUNTER — Other Ambulatory Visit: Payer: Self-pay | Admitting: Family Medicine

## 2017-02-24 NOTE — Telephone Encounter (Signed)
To be picked up Pt notified Miranda LevySara Nikia Baker

## 2017-02-25 ENCOUNTER — Other Ambulatory Visit: Payer: Self-pay | Admitting: Family Medicine

## 2017-03-20 ENCOUNTER — Other Ambulatory Visit: Payer: Self-pay | Admitting: Family Medicine

## 2017-04-08 ENCOUNTER — Other Ambulatory Visit: Payer: Self-pay | Admitting: Family Medicine

## 2017-04-14 NOTE — Telephone Encounter (Signed)
Faxed to [phone]

## 2017-04-18 ENCOUNTER — Other Ambulatory Visit: Payer: Self-pay | Admitting: Family Medicine

## 2017-04-18 MED ORDER — TRAMADOL HCL 50 MG PO TABS: ORAL_TABLET | ORAL | 5 refills | Status: DC

## 2017-04-18 NOTE — Progress Notes (Signed)
We evidently fxed it to wrong pharmacy. I called it in to [facility]. Denny Levy

## 2017-05-02 ENCOUNTER — Ambulatory Visit (INDEPENDENT_AMBULATORY_CARE_PROVIDER_SITE_OTHER): Payer: BLUE CROSS/BLUE SHIELD | Admitting: *Deleted

## 2017-05-02 DIAGNOSIS — Z23 Encounter for immunization: Secondary | ICD-10-CM

## 2017-06-23 ENCOUNTER — Other Ambulatory Visit: Payer: Self-pay | Admitting: Family Medicine

## 2017-06-24 ENCOUNTER — Telehealth: Payer: Self-pay | Admitting: *Deleted

## 2017-06-24 NOTE — Telephone Encounter (Signed)
Pharmacy left message stating that patients formulary will no longer cover brand name benicar hct. Is it ok to do a generic?

## 2017-06-30 MED ORDER — OLMESARTAN MEDOXOMIL-HCTZ 40-12.5 MG PO TABS: ORAL_TABLET | ORAL | 3 refills | Status: DC

## 2017-07-01 ENCOUNTER — Other Ambulatory Visit: Payer: Self-pay | Admitting: Family Medicine

## 2017-08-11 ENCOUNTER — Other Ambulatory Visit: Payer: Self-pay | Admitting: Family Medicine

## 2017-09-15 ENCOUNTER — Other Ambulatory Visit: Payer: Self-pay | Admitting: Family Medicine

## 2017-09-16 DIAGNOSIS — E7800 Pure hypercholesterolemia, unspecified: Secondary | ICD-10-CM | POA: Diagnosis not present

## 2017-09-17 DIAGNOSIS — I10 Essential (primary) hypertension: Secondary | ICD-10-CM | POA: Diagnosis not present

## 2017-09-17 DIAGNOSIS — I2510 Atherosclerotic heart disease of native coronary artery without angina pectoris: Secondary | ICD-10-CM | POA: Diagnosis not present

## 2017-09-17 DIAGNOSIS — E7800 Pure hypercholesterolemia, unspecified: Secondary | ICD-10-CM | POA: Diagnosis not present

## 2017-09-25 MED FILL — ATORVASTATIN 80 MG TABLET: 80 | 90 days supply | Qty: 90 | Fill #0 | Status: TO

## 2017-11-10 ENCOUNTER — Other Ambulatory Visit: Payer: Self-pay | Admitting: Family Medicine

## 2017-12-23 ENCOUNTER — Other Ambulatory Visit: Payer: Self-pay | Admitting: Family Medicine

## 2017-12-25 ENCOUNTER — Telehealth: Payer: Self-pay

## 2017-12-25 NOTE — Telephone Encounter (Signed)
Received fax from [facility] pharmacy requesting prior authorization of Tramadol. Clinical questions in PCP's box for completion.  Ples Specter, RN [facility] RN)

## 2017-12-30 NOTE — Telephone Encounter (Signed)
Clinical questions submitted via CoverMyMeds. Status pending. Should receive fax response within 3-5 days. Ples Specter, RN [facility] RN)

## 2017-12-31 NOTE — Telephone Encounter (Signed)
Denied per [employer] and CoverMyMeds. Denial letter in PCP's box. States subscribers filling an immediate acting opioid for the first time in 180 days are limited to a 7 day supply only. Please resend RX for Tramadol as a 7 day supply if appropriate.  Ples Specter, RN [facility] RN)

## 2018-01-01 NOTE — Telephone Encounter (Signed)
Covering for Dr. Jennette Kettle who is out of the office.  Reviewed [place] PMP. It looks like last fill was on 5/23, written 5/22 for 120 tablets. Last rx before that was written 04/18/17, and filled 08/14/17.  I called patient's pharmacy to confirm that she actually got this filled on 5/23, pharmacy advised that patient did get it filled and paid cash for the rx.  Will defer further rx's to Dr. Jennette Kettle upon her return next week week. It appears she may need to start with a 7 day supply before insurance will cover any longer-term rx's.  Latrelle Dodrill, MD

## 2018-02-12 ENCOUNTER — Other Ambulatory Visit: Payer: Self-pay

## 2018-02-12 ENCOUNTER — Encounter (HOSPITAL_COMMUNITY): Payer: Self-pay | Admitting: Emergency Medicine

## 2018-02-12 ENCOUNTER — Emergency Department (HOSPITAL_COMMUNITY): Payer: BLUE CROSS/BLUE SHIELD

## 2018-02-12 ENCOUNTER — Emergency Department (HOSPITAL_COMMUNITY)
Admission: EM | Admit: 2018-02-12 | Discharge: 2018-02-12 | Disposition: A | Discharge status: Home / Self Care | Payer: BLUE CROSS/BLUE SHIELD | Attending: Emergency Medicine | Admitting: Emergency Medicine

## 2018-02-12 DIAGNOSIS — Z79899 Other long term (current) drug therapy: Secondary | ICD-10-CM | POA: Insufficient documentation

## 2018-02-12 DIAGNOSIS — I10 Essential (primary) hypertension: Secondary | ICD-10-CM | POA: Insufficient documentation

## 2018-02-12 DIAGNOSIS — N3091 Cystitis, unspecified with hematuria: Secondary | ICD-10-CM | POA: Insufficient documentation

## 2018-02-12 DIAGNOSIS — R319 Hematuria, unspecified: Secondary | ICD-10-CM | POA: Diagnosis not present

## 2018-02-12 DIAGNOSIS — I2510 Atherosclerotic heart disease of native coronary artery without angina pectoris: Secondary | ICD-10-CM | POA: Diagnosis not present

## 2018-02-12 DIAGNOSIS — N3090 Cystitis, unspecified without hematuria: Secondary | ICD-10-CM | POA: Diagnosis not present

## 2018-02-12 DIAGNOSIS — R300 Dysuria: Secondary | ICD-10-CM | POA: Diagnosis not present

## 2018-02-12 LAB — BASIC METABOLIC PANEL
Anion gap: 8 (ref 5–15)
BUN: 21 mg/dL — ABNORMAL HIGH (ref 6–20)
CHLORIDE: 104 mmol/L (ref 98–111)
CO2: 29 mmol/L (ref 22–32)
CREATININE: 1.02 mg/dL — AB (ref 0.44–1.00)
Calcium: 10.2 mg/dL (ref 8.9–10.3)
GFR calc Af Amer: >60 mL/min (ref >60)
GFR calc non Af Amer: 59 mL/min — ABNORMAL LOW (ref >60)
GLUCOSE: 101 mg/dL — AB (ref 70–99)
POTASSIUM: 3.8 mmol/L (ref 3.5–5.1)
SODIUM: 141 mmol/L (ref 135–145)

## 2018-02-12 LAB — CBC WITH DIFFERENTIAL/PLATELET
Abs Immature Granulocytes: 0 10*3/uL (ref 0.0–0.1)
BASOS ABS: 0.1 10*3/uL (ref 0.0–0.1)
Basophils Relative: 1 %
EOS ABS: 0.6 10*3/uL (ref 0.0–0.7)
EOS PCT: 6 %
HEMATOCRIT: 39.4 % (ref 36.0–46.0)
HEMOGLOBIN: 12.2 g/dL (ref 12.0–15.0)
Immature Granulocytes: 0 %
Lymphocytes Relative: 26 %
Lymphs Abs: 2.8 10*3/uL (ref 0.7–4.0)
MCH: 28.4 pg (ref 26.0–34.0)
MCHC: 31 g/dL (ref 30.0–36.0)
MCV: 91.8 fL (ref 78.0–100.0)
MONO ABS: 0.7 10*3/uL (ref 0.1–1.0)
Monocytes Relative: 7 %
Neutro Abs: 6.6 10*3/uL (ref 1.7–7.7)
Neutrophils Relative %: 60 %
Platelets: 232 10*3/uL (ref 150–400)
RBC: 4.29 MIL/uL (ref 3.87–5.11)
RDW: 13.2 % (ref 11.5–15.5)
WBC: 10.8 10*3/uL — AB (ref 4.0–10.5)

## 2018-02-12 LAB — URINALYSIS, ROUTINE W REFLEX MICROSCOPIC

## 2018-02-12 LAB — I-STAT CG4 LACTIC ACID, ED: Lactic Acid, Venous: 0.91 mmol/L (ref 0.5–1.9)

## 2018-02-12 MED ORDER — FLUCONAZOLE 150 MG PO TABS: 150.0000 mg | ORAL_TABLET | Freq: Once | ORAL | 0 refills | Status: AC

## 2018-02-12 MED ORDER — CEPHALEXIN 500 MG PO CAPS: 500.0000 mg | ORAL_CAPSULE | Freq: Two times a day (BID) | ORAL | 0 refills | Status: DC

## 2018-02-12 MED ORDER — SODIUM CHLORIDE 0.9 % IV SOLN
1.0000 g | Freq: Once | INTRAVENOUS | Status: AC
  Administered 2018-02-12: 1 g via INTRAVENOUS
  Filled 2018-02-12: qty 10

## 2018-02-12 NOTE — ED Notes (Signed)
Patient verbalizes understanding of discharge instructions. Opportunity for questioning and answers were provided. Armband removed by staff, pt discharged from ED.  

## 2018-02-12 NOTE — ED Triage Notes (Signed)
Pt reports sudden on set of pelvic "pressure" and an urgency to urinate.  Pt states once she emptied her bladder she continues to feel the urgency and is now having blood in her urine.

## 2018-02-12 NOTE — ED Provider Notes (Signed)
[facility] Provider Note   CSN: [id number] Arrival date & time: 02/12/18  0117     History   Chief Complaint Chief Complaint  Patient presents with  . Hematuria  . Pelvic Pain    HPI Miranda Baker is a 57 y.o. female.  Patient presents to the emergency department for evaluation of urinary frequency, dysuria, low urine volume, blood in her urine.  Symptoms began around midnight.  She reports that she feels pressure and pain when she urinates.  As soon as she finishes urinating she feels like she needs to go again.     Past Medical History:  Diagnosis Date  . Cellulitis   . Constipation   . Coronary artery disease    Stent 2.75x15 Xience 2011  . GERD (gastroesophageal reflux disease)   . Hypertension   . MI (mitral incompetence)     Patient Active Problem List   Diagnosis Date Noted  . Right fibular fracture 11/04/2016  . Hyperlipidemia 09/27/2014  . Radiculopathy, lumbar region 09/26/2014  . Attention and concentration deficit 08/13/2012  . Postsurgical percutaneous transluminal coronary angioplasty (PTCA) status 01/21/2012  . Allergic rhinitis due to other allergen 10/28/2011  . Dysthymia 01/09/2011  . Sinusitis, chronic 09/24/2010  . Other complicated headache syndrome 05/20/2010  . Coronary atherosclerosis 11/28/2009  . GERD, SEVERE 07/21/2009  . CONSTIPATION 05/17/2009  . Essential hypertension 04/17/2009  . OTHER TEAR CARTILAGE OR MENISCUS KNEE CURRENT 07/19/2008  . LATERAL EPICONDYLITIS, RIGHT 07/13/2007    Past Surgical History:  Procedure Laterality Date  . ABDOMINAL HYSTERECTOMY  [employer]  . ESOPHAGOGASTRODUODENOSCOPY N/A 10/07/2012   Procedure: ESOPHAGOGASTRODUODENOSCOPY (EGD);  Surgeon: Willis ModenaWilliam Outlaw, MD;  [facility];  Service: Endoscopy;  Laterality: N/A;  . HERNIA REPAIR    . lap for adhesions    . LEFT HEART CATHETERIZATION WITH CORONARY ANGIOGRAM N/A 01/21/2012   Procedure: LEFT HEART  CATHETERIZATION WITH CORONARY ANGIOGRAM;  Surgeon: Pamella PertJagadeesh R Ganji, MD;  [facility];  Service: Cardiovascular;  Laterality: N/A;  . OOPHORECTOMY       OB History   None      Home Medications    Prior to Admission medications   Medication Sig Start Date End Date Taking? Authorizing Provider  ALPRAZolam Prudy Feeler(XANAX) 1 MG tablet TAKE ONE TABLET BY MOUTH UP TO 3 TIMES DAILY AS NEEDED FOR ANXIETY 08/14/17  Yes Nestor RampNeal, Sara L, MD  atorvastatin (LIPITOR) 80 MG tablet Take 1 tablet (80 mg total) by mouth every morning. Patient taking differently: Take 80 mg by mouth daily.  08/23/16  Yes Nestor RampNeal, Sara L, MD  citalopram (CELEXA) 40 MG tablet TAKE ONE TABLET BY MOUTH AT BEDTIME 11/10/17  Yes Nestor RampNeal, Sara L, MD  ezetimibe (ZETIA) 10 MG tablet TAKE ONE TABLET BY MOUTH DAILY 11/10/17  Yes Nestor RampNeal, Sara L, MD  METADATE ER 20 MG ER tablet TAKE ONE TABLET BY MOUTH EACH MORNING AND REPEAT ONCE AT NOON DAILY AS DIRECTED 02/24/17  Yes Nestor RampNeal, Sara L, MD  metoprolol tartrate (LOPRESSOR) 25 MG tablet TAKE ONE TABLET BY MOUTH DAILY 09/15/17  Yes Nestor RampNeal, Sara L, MD  montelukast (SINGULAIR) 10 MG tablet TAKE ONE TABLET BY MOUTH AT BEDTIME 01/02/17  Yes Hensel, Santiago BumpersWilliam A, MD  nitroGLYCERIN (NITROSTAT) 0.4 MG SL tablet Place 1 tablet (0.4 mg total) under the tongue every 5 (five) minutes x 3 doses as needed for chest pain. 07/31/15 02/12/26 Yes Nestor RampNeal, Sara L, MD  olmesartan-hydrochlorothiazide (BENICAR HCT) 40-12.5 MG tablet Take one half  tab qd 06/30/17  Yes [name], MD  pantoprazole (PROTONIX) 40 MG tablet TAKE ONE TABLET BY MOUTH TWICE DAILY 06/24/17  Yes [name], MD  PATADAY 0.2 % SOLN INSTILL ONE DROP INTO THE AFFECTED EYE(S) ONCE DAILY AS DIRECTED 10/10/16  Yes [name], MD  sucralfate (CARAFATE) 1 g tablet TAKE ONE TABLET BY MOUTH FOUR (4) TIMES DAILY WITH MEALS AND AT BEDTIME 02/25/17  Yes [name], MD  cephALEXin (KEFLEX) 500 MG capsule Take 1 capsule (500 mg total) by mouth 2 (two) times daily. 02/12/18    Gilda Crease, MD  traMADol (ULTRAM) 50 MG tablet TAKE 1 TO 2 TABLETS BY MOUTH EVERY 8 TO 12 HOURS AS NEEDED Patient not taking: Reported on 02/12/2018 04/18/17   [name], MD  traMADol (ULTRAM) 50 MG tablet TAKE 1 TO 2 TABLETS BY MOUTH EVERY 8-12 HOURS AS NEEDED Patient not taking: Reported on 02/12/2018 12/24/17   [name], MD    Family History No family history on file.  Social History Social History   Tobacco Use  . Smoking status: Never Smoker  . Smokeless tobacco: Never Used  Substance Use Topics  . Alcohol use: No  . Drug use: No     Allergies   Erythromycin; Morphine and related; Penicillins; Tetracyclines & related; Vibramycin [doxycycline calcium]; and Bactrim [sulfamethoxazole-trimethoprim]   Review of Systems Review of Systems  Genitourinary: Positive for dysuria, frequency, hematuria and urgency.  All other systems reviewed and are negative.    Physical Exam Updated Vital Signs BP 107/71   Pulse 79   Temp 97.7 F (36.5 C) (Oral)   Resp 16   Ht 5\' 6"  (1.676 m)   Wt 77.1 kg (170 lb)   SpO2 96%   BMI 27.44 kg/m   Physical Exam  Constitutional: She is oriented to person, place, and time. She appears well-developed and well-nourished. No distress.  HENT:  Head: Normocephalic and atraumatic.  Right Ear: Hearing normal.  Left Ear: Hearing normal.  Nose: Nose normal.  Mouth/Throat: Oropharynx is clear and moist and mucous membranes are normal.  Eyes: Pupils are equal, round, and reactive to light. Conjunctivae and EOM are normal.  Neck: Normal range of motion. Neck supple.  Cardiovascular: Regular rhythm, S1 normal and S2 normal. Exam reveals no gallop and no friction rub.  No murmur heard. Pulmonary/Chest: Effort normal and breath sounds normal. No respiratory distress. She exhibits no tenderness.  Abdominal: Soft. Normal appearance and bowel sounds are normal. There is no hepatosplenomegaly. There is tenderness in the suprapubic area.  There is no rebound, no guarding, no tenderness at McBurney's point and negative Murphy's sign. No hernia.  Musculoskeletal: Normal range of motion.  Neurological: She is alert and oriented to person, place, and time. She has normal strength. No cranial nerve deficit or sensory deficit. Coordination normal. GCS eye subscore is 4. GCS verbal subscore is 5. GCS motor subscore is 6.  Skin: Skin is warm, dry and intact. No rash noted. No cyanosis.  Psychiatric: She has a normal mood and affect. Her speech is normal and behavior is normal. Thought content normal.  Nursing note and vitals reviewed.    ED Treatments / Results  Labs (all labs ordered are listed, but only abnormal results are displayed) Labs Reviewed  URINALYSIS, ROUTINE W REFLEX MICROSCOPIC - Abnormal; Notable for the following components:      Result Value   Color, Urine RED (*)    APPearance TURBID (*)  Glucose, UA   (*)    Value: TEST NOT REPORTED DUE TO COLOR INTERFERENCE OF URINE PIGMENT   Hgb urine dipstick   (*)    Value: TEST NOT REPORTED DUE TO COLOR INTERFERENCE OF URINE PIGMENT   Bilirubin Urine   (*)    Value: TEST NOT REPORTED DUE TO COLOR INTERFERENCE OF URINE PIGMENT   Ketones, ur   (*)    Value: TEST NOT REPORTED DUE TO COLOR INTERFERENCE OF URINE PIGMENT   Protein, ur   (*)    Value: TEST NOT REPORTED DUE TO COLOR INTERFERENCE OF URINE PIGMENT   Nitrite   (*)    Value: TEST NOT REPORTED DUE TO COLOR INTERFERENCE OF URINE PIGMENT   Leukocytes, UA   (*)    Value: TEST NOT REPORTED DUE TO COLOR INTERFERENCE OF URINE PIGMENT   RBC / HPF >50 (*)    WBC, UA >50 (*)    Bacteria, UA RARE (*)    All other components within normal limits  CBC WITH DIFFERENTIAL/PLATELET - Abnormal; Notable for the following components:   WBC 10.8 (*)    All other components within normal limits  BASIC METABOLIC PANEL - Abnormal; Notable for the following components:   Glucose, Bld 101 (*)    BUN 21 (*)    Creatinine, Ser 1.02  (*)    GFR calc non Af Amer 59 (*)    All other components within normal limits  URINE CULTURE  I-STAT CG4 LACTIC ACID, ED    EKG None  Radiology Ct Renal Stone Study  Result Date: 02/12/2018 CLINICAL DATA:  Hematuria.  Lower abdominal pain. EXAM: CT ABDOMEN AND PELVIS WITHOUT CONTRAST TECHNIQUE: Multidetector CT imaging of the abdomen and pelvis was performed following the standard protocol without IV contrast. COMPARISON:  CT 01/17/2011 FINDINGS: Lower chest: The lung bases are clear. Hepatobiliary: No focal liver abnormality is seen. No gallstones, gallbladder wall thickening, or biliary dilatation. Pancreas: No ductal dilatation or inflammation. Spleen: Normal in size without focal abnormality. Adrenals/Urinary Tract: Normal adrenal glands. No hydronephrosis or perinephric edema. Ureters are nondistended. No urolithiasis. No evidence of focal renal mass allowing for noncontrast exam. Urinary bladder is partially distended. No bladder wall thickening. Small amount of hyperdense material in the dependent bladder may be hemorrhage given history. No visualized bladder mass on noncontrast exam. Stomach/Bowel: Stomach is within normal limits. Appendix appears normal, for example image 62 series 3. No evidence of bowel wall thickening, distention, or inflammatory changes. Moderate stool burden throughout the colon. Vascular/Lymphatic: Aorto bi-iliac atherosclerosis without aneurysm. Small central mesenteric nodes with adjacent mesenteric edema in the central upper abdomen. No enlarged lymph nodes in the abdomen or pelvis. Reproductive: Status post hysterectomy. No adnexal masses. Other: Prior anterior abdominal wall hernia repair with mesh and surgical tacks. No free air, free fluid, or intra-abdominal fluid collection. Musculoskeletal: Disc space narrowing, endplate spurring, and Modic endplate changes at L2-L3. There are no acute or suspicious osseous abnormalities. IMPRESSION: 1. Small amount of  high-density material in the dependent urinary bladder is nonspecific, may represent debris or hemorrhage given history of hematuria. No underlying cause for hematuria identified. No urolithiasis. 2.  Aortic Atherosclerosis (ICD10-I70.0). Electronically Signed   By: Rubye Oaks M.D.   On: 02/12/2018 05:38    Procedures Procedures (including critical care time)  Medications Ordered in ED Medications  cefTRIAXone (ROCEPHIN) 1 g in sodium chloride 0.9 % 100 mL IVPB (0 g Intravenous Stopped 02/12/18 0801)     Initial Impression /  Assessment and Plan / ED Course  I have reviewed the triage vital signs and the nursing notes.  Pertinent labs & imaging results that were available during my care of the patient were reviewed by me and considered in my medical decision making (see chart for details).     Patient presents to the emergency department with urinary symptoms.  Patient has a significant amount of hematuria.  She is complaining of pelvic pain as well.  Urinalysis does suggest infection.  She does not have any evidence of sepsis.  CT scan performed because of the amount of hematuria to ensure that there was not a kidney stone concomitant with urinary tract infection.  There is no stone noted, patient will be treated with Rocephin and outpatient Keflex, follow-up with primary care.  Final Clinical Impressions(s) / ED Diagnoses   Final diagnoses:  Hemorrhagic cystitis    ED Discharge Orders        Ordered    cephALEXin (KEFLEX) 500 MG capsule  2 times daily     02/12/18 0700    fluconazole (DIFLUCAN) 150 MG tablet   Once     02/12/18 0700       Gilda Crease, MD 02/13/18 [phone]

## 2018-02-14 LAB — URINE CULTURE: Culture: >=100000 — AB

## 2018-02-17 ENCOUNTER — Ambulatory Visit: Payer: BLUE CROSS/BLUE SHIELD | Admitting: Family Medicine

## 2018-02-17 ENCOUNTER — Encounter: Payer: Self-pay | Admitting: Family Medicine

## 2018-02-17 ENCOUNTER — Other Ambulatory Visit: Payer: Self-pay

## 2018-02-17 VITALS — BP 122/70 | HR 71 | Temp 98.4°F | Wt 182.4 lb

## 2018-02-17 DIAGNOSIS — R310 Gross hematuria: Secondary | ICD-10-CM

## 2018-02-17 DIAGNOSIS — R5383 Other fatigue: Secondary | ICD-10-CM

## 2018-02-18 ENCOUNTER — Encounter: Payer: Self-pay | Admitting: Family Medicine

## 2018-02-18 DIAGNOSIS — R310 Gross hematuria: Secondary | ICD-10-CM | POA: Insufficient documentation

## 2018-02-18 DIAGNOSIS — R5383 Other fatigue: Secondary | ICD-10-CM | POA: Insufficient documentation

## 2018-02-18 LAB — TSH: TSH: 2.06 u[IU]/mL (ref 0.450–4.500)

## 2018-02-18 LAB — CBC
HEMATOCRIT: 40.4 % (ref 34.0–46.6)
HEMOGLOBIN: 13.2 g/dL (ref 11.1–15.9)
MCH: 28.7 pg (ref 26.6–33.0)
MCHC: 32.7 g/dL (ref 31.5–35.7)
MCV: 88 fL (ref 79–97)
PLATELETS: 289 10*3/uL (ref 150–450)
RBC: 4.6 x10E6/uL (ref 3.77–5.28)
RDW: 14.1 % (ref 12.3–15.4)
WBC: 7.8 10*3/uL (ref 3.4–10.8)

## 2018-02-18 LAB — IRON AND TIBC
Iron Saturation: 22 % (ref 15–55)
Iron: 70 ug/dL (ref 27–159)
Total Iron Binding Capacity: 319 ug/dL (ref 250–450)
UIBC: 249 ug/dL (ref 131–425)

## 2018-02-18 LAB — VITAMIN D 25 HYDROXY (VIT D DEFICIENCY, FRACTURES): Vit D, 25-Hydroxy: 52.9 ng/mL (ref 30.0–100.0)

## 2018-02-18 NOTE — Progress Notes (Signed)
    CHIEF COMPLAINT / HPI: Follow-up emergency department visit for blood in urine.  They started her on antibiotic.  She is improved and her symptoms but not resolved.  She no longer has blood in her urine but she still feels like she has a lot of bladder pressure.  She has not yet completed the antibiotics.  No prior episode of urinary tract infection caused hematuria.   On the day of initial hematuria, she had no symptoms of fever, no urgency or urinary burning.  She woke up in the middle the night to void, and noticed she had gross blood in the toilet.  At that time she also noticed that she had some urinary burning.  #2.  Also notes that she is been extremely tired recently.  Sleeps well but this never feels rested.  A lot of stress, her husband is back in the hospital.  She continues to work most days 5 to 6 hours.  No muscle aches or joint aches.  No overt depression or anhedonia.  No lability of mood.  REVIEW OF SYSTEMS: See HPI.  Additional pertinent review of systems: She  had no fever.  No abdominal pain.  No incontinence.  PERTINENT  PMH / PSH: I have reviewed the patient's medications, allergies, past medical and surgical history, smoking status and updated in the EMR as appropriate. History of bladder sling repair.  OBJECTIVE: General: Well-developed female no acute distress ABDOMEN: Soft, positive bowel sounds nontender nondistended CV: Regular rate and rhythm without murmur LUNGS: Clear to auscultation bilaterally.  Normal work of breathing PSYCH: Alert and awake x4.  Affect is interactive.  Recent remote memory is intact.  Judgment is normal.  Speech is normal fluency and content.  No psychomotor retardation no agitation.  Laboratory review: Urinary culture positive for Klebsiella pneumoniae.  Unremarkable CBC, CMP. Imaging review: CT scan showed no stones.  Hyperdensity noted in the bladder which would be consistent with blood.  ASSESSMENT / PLAN:  Gross hematuria Her  urinary tract pathogen is sensitive to her current antibiotics and will complete that.  Should symptoms not totally resolved, with do urinalysis test of cure.  She will let me know biotic therapy lab.  Unclear why she had such gross hematuria with very few preemptive symptoms.  Should she have this again, would consider urological eval for bladder stone or pathology.  CT scan pretty much ruled out ureteral stone  Other fatigue We will check thyroid, vitamin D3, recheck hemoglobin today and iron panel.  I suspect her fatigue is related to her ongoing chronic stressors will notify her of her results.

## 2018-02-18 NOTE — Assessment & Plan Note (Signed)
Her urinary tract pathogen is sensitive to her current antibiotics and will complete that.  Should symptoms not totally resolved, with do urinalysis test of cure.  She will let me know biotic therapy lab.  Unclear why she had such gross hematuria with very few preemptive symptoms.  Should she have this again, would consider urological eval for bladder stone or pathology.  CT scan pretty much ruled out ureteral stone

## 2018-02-18 NOTE — Assessment & Plan Note (Signed)
We will check thyroid, vitamin D3, recheck hemoglobin today and iron panel.  I suspect her fatigue is related to her ongoing chronic stressors will notify her of her results.

## 2018-02-24 ENCOUNTER — Encounter: Payer: Self-pay | Admitting: Family Medicine

## 2018-03-06 ENCOUNTER — Other Ambulatory Visit: Payer: Self-pay

## 2018-03-06 MED ORDER — MONTELUKAST SODIUM 10 MG PO TABS: 10.0000 mg | ORAL_TABLET | Freq: Every day | ORAL | 3 refills | Status: DC

## 2018-03-23 ENCOUNTER — Other Ambulatory Visit: Payer: Self-pay

## 2018-03-23 ENCOUNTER — Encounter: Payer: Self-pay | Admitting: Family Medicine

## 2018-03-23 ENCOUNTER — Ambulatory Visit: Payer: BLUE CROSS/BLUE SHIELD | Admitting: Family Medicine

## 2018-03-23 VITALS — BP 110/68 | HR 56 | Temp 98.6°F | Ht 66.0 in | Wt 190.4 lb

## 2018-03-23 DIAGNOSIS — F410 Panic disorder [episodic paroxysmal anxiety] without agoraphobia: Secondary | ICD-10-CM | POA: Diagnosis not present

## 2018-03-23 MED ORDER — ALPRAZOLAM 1 MG PO TABS: ORAL_TABLET | ORAL | 3 refills | Status: DC

## 2018-03-23 MED FILL — ALPRAZolam 1 MG TABS: 1 | 30 days supply | Qty: 90 | Fill #0

## 2018-03-23 NOTE — Progress Notes (Signed)
    CHIEF COMPLAINT / HPI: #1.  Increased problems with panic attacks occurring once or twice daily now.  Her husband who is status post lung transplant has been readmitted to the hospital multiple times in the last 6 months.  3 weeks ago [facility].  He has had 2 cardiac arrests in the time he has been there.  He remains disoriented and combative.  She has been trying to do everything she can to keep her mood stable.  Her family has been very helpful, taking her on some outings etc.  Despite this, she still awakens every morning with a sense of panic and by 7 AM, she sweating, hyperventilating.  Usually she tries to calm herself down by talking with a family member or friend.  She will usually be okay until she goes out to do something or has to visit him in the hospital which will incite another panic event.  She has had decreased appetite.  No suicidal or homicidal ideation.  Mood other than the significant increase in anxiety stable.  She is sleeping well at night using 1 mg of Ativan.  She continues to be regular on her Celexa and wonders if she needs a higher dose.  REVIEW OF SYSTEMS: See HPI.  Additional pertinent review of systems is positive for weight change, on 5 pounds., negative for fever.   PERTINENT  PMH / PSH: I have reviewed the patient's medications, allergies, past medical and surgical history, smoking status and updated in the EMR as appropriate.   OBJECTIVE: GENERAL: Well-developed female no acute distress psychiatric: Tearful.  Affect is interactive.  She is able to get all of her mood and converse in normal sentences for at least part of the visit.  Recent remote memory is intact.  Judgment is normal.  She asks and answers questions appropriately.  She does not appear agitated.  There is no psychomotor retardation.  She is neatly dressed.  ASSESSMENT / PLAN:  Panic disorder (episodic paroxysmal anxiety) Situational stressors are extremely  high.  Her previous therapist has retired.  I recommend she find someone else to establish with.  I will increase her Xanax.  Follow-up 1 month.  Long discussion about management of panic.  All in all, given situation, she is doing extremely well.  She is on max dose of Celexa.  I do not think it would be beneficial to change or add additional antidepressant. Greater than 50% of our 25 minute office visit was spent in counseling and education regarding these issues.

## 2018-03-23 NOTE — Assessment & Plan Note (Signed)
Situational stressors are extremely high.  Her previous therapist has retired.  I recommend she find someone else to establish with.  I will increase her Xanax.  Follow-up 1 month.  Long discussion about management of panic.  All in all, given situation, she is doing extremely well.  She is on max dose of Celexa.  I do not think it would be beneficial to change or add additional antidepressant. Greater than 50% of our 25 minute office visit was spent in counseling and education regarding these issues.

## 2018-05-04 ENCOUNTER — Telehealth: Payer: Self-pay | Admitting: *Deleted

## 2018-05-04 ENCOUNTER — Other Ambulatory Visit: Payer: Self-pay | Admitting: Family Medicine

## 2018-05-04 MED ORDER — CANDESARTAN CILEXETIL-HCTZ 32-12.5 MG PO TABS: 1.0000 | ORAL_TABLET | Freq: Every day | ORAL | 3 refills | Status: DC

## 2018-05-04 NOTE — Telephone Encounter (Signed)
Per pharmacy olmesartan-hydrochlorothiazide is on backorder.   Can we call in something else? Miranda Baker, Maryjo Rochester, CMA

## 2018-05-05 ENCOUNTER — Telehealth: Payer: Self-pay

## 2018-05-05 NOTE — Telephone Encounter (Signed)
Received fax from [place], [place] needed on Candesartan.  Clinical questions submitted via Cover My Meds. However, no place to free text that previous med Olmesartan is on backorder. Attached telephone message asking for alternative medication. Waiting on response, could take up to 72 hours.  Cover My Meds info: Key: [id number]  Nigel Mormon, RN

## 2018-05-08 NOTE — Telephone Encounter (Signed)
This medication has been denied by [facility]. No explanation was given.  Ples Specter, RN [facility] RN)

## 2018-05-14 ENCOUNTER — Other Ambulatory Visit: Payer: Self-pay | Admitting: Family Medicine

## 2018-05-14 NOTE — Telephone Encounter (Signed)
DearTeam Can u call [facility] and see if: 1. Olmesartan is still on back order 2. Is candesartan NOT on her formulary (because on myl ist it IS) 3. If no to both please ask the pharmacist what ARB HCTZ combinations ARE on her formulary THANKS! Denny Levy

## 2018-05-15 NOTE — Telephone Encounter (Signed)
Contacted phillip at pharmacy and he said that the olmesartan/HCTZ combo is still on back order.  He said that as a combo you could try losartan or you could do two separate Rx's of the olmesartan and HCTZ because they are available separate. Routing to PCP. Lamonte Sakai, Kaivon Livesey D, [place]

## 2018-05-26 MED ORDER — OLMESARTAN MEDOXOMIL 40 MG PO TABS: 40.0000 mg | ORAL_TABLET | Freq: Every day | ORAL | 3 refills | Status: DC

## 2018-05-26 MED ORDER — HYDROCHLOROTHIAZIDE 25 MG PO TABS
ORAL_TABLET | ORAL | 3 refills | Status: DC
Start: 2018-05-26 — End: 2018-06-01

## 2018-05-26 NOTE — Addendum Note (Signed)
Addended byDenny Levy L on: 05/26/2018 03:40 PM   Modules accepted: Orders

## 2018-05-28 ENCOUNTER — Telehealth: Payer: Self-pay

## 2018-05-28 MED FILL — ALPRAZolam 1 MG TABS: 1 | 30 days supply | Qty: 90 | Fill #0

## 2018-05-28 NOTE — Telephone Encounter (Signed)
[facility] [address] [facility] [address] [facility] called. Stated patient thought two prescriptions were going to be sent in for her yesterday. Nothing seen in chart. Please advise.  Ples Specter, RN [facility] RN)

## 2018-06-01 MED ORDER — OLMESARTAN MEDOXOMIL 40 MG PO TABS: 40.0000 mg | ORAL_TABLET | Freq: Every day | ORAL | 3 refills | Status: DC

## 2018-06-01 MED ORDER — HYDROCHLOROTHIAZIDE 25 MG PO TABS: ORAL_TABLET | ORAL | 3 refills | Status: DC

## 2018-06-01 NOTE — Telephone Encounter (Signed)
LVM for pt to call office back to inform her of below. Zimmerman Rumple, Vernel Langenderfer D, CMA  

## 2018-06-01 NOTE — Telephone Encounter (Signed)
I sent them to [facility] OTHER pharmacy[facility]. I have now [facility] them [facility] [address] Please let her know THANKS! Denny Levy

## 2018-06-03 NOTE — Telephone Encounter (Signed)
LVM for a return call to our office. If pt calls, please give her the information below. Miranda Baker, CMA

## 2018-06-04 ENCOUNTER — Encounter: Payer: Self-pay | Admitting: *Deleted

## 2018-06-04 NOTE — Telephone Encounter (Signed)
Left message to inform her of below also sent a my chart message in hope of her getting the message. Lamonte Sakai, Ceaser Ebeling D, [place]

## 2018-06-10 ENCOUNTER — Other Ambulatory Visit: Payer: Self-pay | Admitting: Family Medicine

## 2018-06-10 MED ORDER — METHYLPHENIDATE HCL ER 20 MG PO TBCR: EXTENDED_RELEASE_TABLET | ORAL | 0 refills | Status: DC

## 2018-06-10 MED FILL — METHYLPHENIDATE ER 20 MG TA: 20 | 90 days supply | Qty: 180 | Fill #0

## 2018-07-06 DIAGNOSIS — H0100A Unspecified blepharitis right eye, upper and lower eyelids: Secondary | ICD-10-CM | POA: Diagnosis not present

## 2018-07-06 DIAGNOSIS — H04123 Dry eye syndrome of bilateral lacrimal glands: Secondary | ICD-10-CM | POA: Diagnosis not present

## 2018-07-06 DIAGNOSIS — H5203 Hypermetropia, bilateral: Secondary | ICD-10-CM | POA: Diagnosis not present

## 2018-07-06 DIAGNOSIS — H52202 Unspecified astigmatism, left eye: Secondary | ICD-10-CM | POA: Diagnosis not present

## 2018-07-06 DIAGNOSIS — H25013 Cortical age-related cataract, bilateral: Secondary | ICD-10-CM | POA: Diagnosis not present

## 2018-07-06 DIAGNOSIS — H2513 Age-related nuclear cataract, bilateral: Secondary | ICD-10-CM | POA: Diagnosis not present

## 2018-07-06 DIAGNOSIS — H524 Presbyopia: Secondary | ICD-10-CM | POA: Diagnosis not present

## 2018-08-04 ENCOUNTER — Other Ambulatory Visit: Payer: Self-pay | Admitting: Family Medicine

## 2018-08-31 ENCOUNTER — Other Ambulatory Visit: Payer: Self-pay | Admitting: Family Medicine

## 2018-09-21 ENCOUNTER — Other Ambulatory Visit: Payer: Self-pay | Admitting: Cardiology

## 2018-09-21 DIAGNOSIS — E7800 Pure hypercholesterolemia, unspecified: Secondary | ICD-10-CM | POA: Diagnosis not present

## 2018-09-21 DIAGNOSIS — I2510 Atherosclerotic heart disease of native coronary artery without angina pectoris: Secondary | ICD-10-CM | POA: Diagnosis not present

## 2018-09-22 LAB — COMPREHENSIVE METABOLIC PANEL
A/G RATIO: 2.1 (ref 1.2–2.2)
ALBUMIN: 4.6 g/dL (ref 3.8–4.9)
ALT: 20 IU/L (ref 0–32)
AST: 22 IU/L (ref 0–40)
Alkaline Phosphatase: 69 IU/L (ref 39–117)
BILIRUBIN TOTAL: 0.5 mg/dL (ref 0.0–1.2)
BUN / CREAT RATIO: 18 (ref 9–23)
BUN: 17 mg/dL (ref 6–24)
CALCIUM: 10.4 mg/dL — AB (ref 8.7–10.2)
CO2: 23 mmol/L (ref 20–29)
Chloride: 104 mmol/L (ref 96–106)
Creatinine, Ser: 0.94 mg/dL (ref 0.57–1.00)
GFR, EST AFRICAN AMERICAN: 77 mL/min/{1.73_m2} (ref >59)
GFR, EST NON AFRICAN AMERICAN: 67 mL/min/{1.73_m2} (ref >59)
GLOBULIN, TOTAL: 2.2 g/dL (ref 1.5–4.5)
Glucose: 89 mg/dL (ref 65–99)
POTASSIUM: 5 mmol/L (ref 3.5–5.2)
SODIUM: 142 mmol/L (ref 134–144)
TOTAL PROTEIN: 6.8 g/dL (ref 6.0–8.5)

## 2018-09-22 LAB — CBC WITH DIFFERENTIAL/PLATELET
BASOS: 1 %
Basophils Absolute: 0.1 10*3/uL (ref 0.0–0.2)
EOS (ABSOLUTE): 0.3 10*3/uL (ref 0.0–0.4)
Eos: 4 %
HEMATOCRIT: 39.4 % (ref 34.0–46.6)
Hemoglobin: 12.8 g/dL (ref 11.1–15.9)
IMMATURE GRANS (ABS): 0 10*3/uL (ref 0.0–0.1)
IMMATURE GRANULOCYTES: 0 %
LYMPHS: 34 %
Lymphocytes Absolute: 2.7 10*3/uL (ref 0.7–3.1)
MCH: 28.9 pg (ref 26.6–33.0)
MCHC: 32.5 g/dL (ref 31.5–35.7)
MCV: 89 fL (ref 79–97)
MONOCYTES: 6 %
Monocytes Absolute: 0.5 10*3/uL (ref 0.1–0.9)
NEUTROS ABS: 4.2 10*3/uL (ref 1.4–7.0)
NEUTROS PCT: 55 %
PLATELETS: 263 10*3/uL (ref 150–450)
RBC: 4.43 x10E6/uL (ref 3.77–5.28)
RDW: 12.6 % (ref 11.7–15.4)
WBC: 7.7 10*3/uL (ref 3.4–10.8)

## 2018-09-22 LAB — LIPID PANEL W/O CHOL/HDL RATIO
Cholesterol, Total: 150 mg/dL (ref 100–199)
HDL: 60 mg/dL (ref >39)
LDL Calculated: 64 mg/dL (ref 0–99)
Triglycerides: 130 mg/dL (ref 0–149)
VLDL Cholesterol Cal: 26 mg/dL (ref 5–40)

## 2018-09-24 ENCOUNTER — Ambulatory Visit: Payer: BLUE CROSS/BLUE SHIELD | Admitting: Cardiology

## 2018-09-24 ENCOUNTER — Encounter: Payer: Self-pay | Admitting: Cardiology

## 2018-09-24 VITALS — BP 116/64 | HR 59 | Ht 64.0 in | Wt 189.0 lb

## 2018-09-24 DIAGNOSIS — I2510 Atherosclerotic heart disease of native coronary artery without angina pectoris: Secondary | ICD-10-CM | POA: Diagnosis not present

## 2018-09-24 DIAGNOSIS — E7800 Pure hypercholesterolemia, unspecified: Secondary | ICD-10-CM | POA: Diagnosis not present

## 2018-09-24 DIAGNOSIS — I10 Essential (primary) hypertension: Secondary | ICD-10-CM

## 2018-09-24 DIAGNOSIS — Z9861 Coronary angioplasty status: Secondary | ICD-10-CM

## 2018-09-24 DIAGNOSIS — Z0189 Encounter for other specified special examinations: Secondary | ICD-10-CM

## 2018-09-24 DIAGNOSIS — R202 Paresthesia of skin: Secondary | ICD-10-CM

## 2018-09-24 HISTORY — DX: Encounter for other specified special examinations: Z01.89

## 2018-09-24 HISTORY — DX: Paresthesia of skin: R20.2

## 2018-09-24 NOTE — Progress Notes (Signed)
Subjective:  Primary Physician:  [name], MD  Patient ID: Miranda Baker, female    DOB: 14-Feb-1961, 58 y.o.   MRN: [id number]  Chief Complaint  Patient presents with  . Coronary Artery Disease    1 year   . Hyperlipidemia    HPI: Miranda Baker  is a 58 y.o. female  with coronary artery disease. She had undergone coronary angioplasty to the diagonal 1 branch of the LAD in July 2012. Cardiac catheterization on 01/21/2012 revealing widely patent diagonal stent with mild luminal irregularity in the LAD.  Patient now presents for annual visit. She is presently doing well, she denies any exertional chest pain, shortness of breath, PND or orthopnea.   She has noticed hair falling easily over the past one month and is wondering if it is related to the medications.  Also the past couple months, she has noticed arthralgias in multiple sites including the elbow, hands, knee.  Otherwise no other specific complaints.  Continues to remain active.  Past Medical History:  Diagnosis Date  . Cellulitis   . Constipation   . Coronary artery disease    Stent 2.75x15 Xience 2011  . GERD (gastroesophageal reflux disease)   . Hyperlipidemia   . Hypertension   . Laboratory examination 09/24/2018  . MI (mitral incompetence)   . Paresthesia 09/24/2018    Past Surgical History:  Procedure Laterality Date  . ABDOMINAL HYSTERECTOMY  [facility]  . ESOPHAGOGASTRODUODENOSCOPY N/A 10/07/2012   Procedure: ESOPHAGOGASTRODUODENOSCOPY (EGD);  Surgeon: Willis Modena, MD;  Location: [facility];  Service: Endoscopy;  Laterality: N/A;  . HERNIA REPAIR    . lap for adhesions    . LEFT HEART CATHETERIZATION WITH CORONARY ANGIOGRAM N/A 01/21/2012   Procedure: LEFT HEART CATHETERIZATION WITH CORONARY ANGIOGRAM;  Surgeon: Pamella Pert, MD;  Location: [facility] CATH LAB;  Service: Cardiovascular;  Laterality: N/A;  . OOPHORECTOMY      Social History   Socioeconomic History  . Marital status: Married    Spouse  name: Not on file  . Number of children: Not on file  . Years of education: Not on file  . Highest education level: Not on file  Occupational History  . Occupation: Diplomatic Services operational officer: [employer]  Social Needs  . Financial resource strain: Not on file  . Food insecurity:    Worry: Not on file    Inability: Not on file  . Transportation needs:    Medical: Not on file    Non-medical: Not on file  Tobacco Use  . Smoking status: Never Smoker  . Smokeless tobacco: Never Used  Substance and Sexual Activity  . Alcohol use: No  . Drug use: No  . Sexual activity: Not on file  Lifestyle  . Physical activity:    Days per week: Not on file    Minutes per session: Not on file  . Stress: Not on file  Relationships  . Social connections:    Talks on phone: Not on file    Gets together: Not on file    Attends religious service: Not on file    Active member of club or organization: Not on file    Attends meetings of clubs or organizations: Not on file    Relationship status: Not on file  . Intimate partner violence:    Fear of current or ex partner: Not on file    Emotionally abused: Not on file    Physically abused: Not on file  Forced sexual activity: Not on file  Other Topics Concern  . Not on file  Social History Narrative  . Not on file    Current Outpatient Medications on File Prior to Visit  Medication Sig Dispense Refill  . ALPRAZolam (XANAX) 1 MG tablet TAKE ONE TABLET BY MOUTH UP TO 3 TIMES DAILY AS NEEDED FOR ANXIETY 90 tablet 3  . citalopram (CELEXA) 40 MG tablet TAKE ONE TABLET BY MOUTH AT BEDTIME 90 tablet 3  . ezetimibe (ZETIA) 10 MG tablet TAKE ONE TABLET BY MOUTH DAILY 90 tablet 3  . hydrochlorothiazide (HYDRODIURIL) 25 MG tablet Take one half tab daily by mouth 45 tablet 3  . metoprolol tartrate (LOPRESSOR) 25 MG tablet TAKE ONE TABLET BY MOUTH DAILY 90 tablet 3  . montelukast (SINGULAIR) 10 MG tablet Take 1 tablet (10 mg total) by mouth at  bedtime. 90 tablet 3  . nitroGLYCERIN (NITROSTAT) 0.4 MG SL tablet Place 1 tablet (0.4 mg total) under the tongue every 5 (five) minutes x 3 doses as needed for chest pain. 30 tablet 3  . olmesartan (BENICAR) 40 MG tablet Take 1 tablet (40 mg total) by mouth daily. 90 tablet 3  . pantoprazole (PROTONIX) 40 MG tablet TAKE ONE TABLET BY MOUTH TWICE DAILY 180 tablet 3  . PATADAY 0.2 % SOLN INSTILL ONE DROP INTO THE AFFECTED EYE(S) ONCE DAILY AS DIRECTED 2.5 mL 12  . sucralfate (CARAFATE) 1 g tablet TAKE ONE TABLET BY MOUTH FOUR (4) TIMES DAILY WITH MEALS AND AT BEDTIME 120 tablet 3  . traMADol (ULTRAM) 50 MG tablet TAKE 1 TO 2 TABLETS BY MOUTH EVERY 8-12 HOURS AS NEEDED 120 tablet 4  . atorvastatin (LIPITOR) 80 MG tablet Take 1 tablet (80 mg total) by mouth every morning. (Patient taking differently: Take 80 mg by mouth daily. ) 90 tablet 3  . methylphenidate (METADATE ER) 20 MG ER tablet TAKE ONE TABLET BY MOUTH EACH MORNING AND REPEAT ONCE AT NOON DAILY AS DIRECTED 180 tablet 0   No current facility-administered medications on file prior to visit.      Review of Systems  Constitutional: Negative for malaise/fatigue and weight loss.  Respiratory: Negative for cough, hemoptysis and shortness of breath.   Cardiovascular: Negative for chest pain, palpitations, claudication and leg swelling.  Gastrointestinal: Negative for abdominal pain, blood in stool, constipation, heartburn and vomiting.  Genitourinary: Negative for dysuria.  Musculoskeletal: Positive for joint pain. Negative for myalgias.  Neurological: Negative for dizziness, focal weakness and headaches.  Endo/Heme/Allergies: Does not bruise/bleed easily.  Psychiatric/Behavioral: Negative for depression. The patient is not nervous/anxious.   All other systems reviewed and are negative.      Objective:  Blood pressure 116/64, pulse (!) 59, height 5\' 4"  (1.626 m), weight 189 lb (85.7 kg), SpO2 97 %. Body mass index is 32.44  kg/m.   Physical Exam  Constitutional: She appears well-developed. No distress.  Mildly obese  HENT:  Head: Atraumatic.  Eyes: Conjunctivae are normal.  Neck: Neck supple. No JVD present. No thyromegaly present.  Cardiovascular: Normal rate, regular rhythm, normal heart sounds and intact distal pulses. Exam reveals no gallop.  No murmur heard. Pulmonary/Chest: Effort normal and breath sounds normal.  Abdominal: Soft. Bowel sounds are normal.  Musculoskeletal: Normal range of motion.        General: No edema.  Neurological: She is alert.  Skin: Skin is warm and dry.  Psychiatric: She has a normal mood and affect.     CARDIAC STUDIES:  Story: Cardiac cath 01/21/12: LAD: LAD gives origin to a large diagonal-1. Diagonal one is very large and he is equivalent to the LAD. LAD has mild luminal irregularities. There is a stent (July 2012 2.75x15 mm Xience stent) in the proximal portion of the D1 which is widely patent. The ostium of the D1 and the LAD just after the D1 origin show 20% stenoses. Mid LAD shows mild luminal irregularity. EF 60%  Assessment & Recommendations:   1. Atherosclerosis of native coronary artery of native heart with other form of angina pectoris [facility](HCC) Story: Cardiac cath 01/21/12: LAD: LAD gives origin to a large diagonal-1. Diagonal one is very large and he is equivalent to the LAD. LAD has mild luminal irregularities. There is a stent (July 2012 2.75x15 mm Xience stent) in the proximal portion of the D1 which is widely patent. The ostium of the D1 and the LAD just after the D1 origin show 20% stenoses. Mid LAD shows mild luminal irregularity. EF 60%  EKG 09/24/2018: Sinus bradycardia at rate of 53 bpm, normal axis.  Nonspecific diffuse T abnormality.  2. Postsurgical percutaneous transluminal coronary angioplasty (PTCA) status  3. Essential hypertension, benign   4. Pure hypercholesterolemia  5. Laboratory examination  Lipid Panel     Component Value  Date/Time   CHOL 150 09/21/2018 1136   TRIG 130 09/21/2018 1136   HDL 60 09/21/2018 1136   CHOLHDL 3.7 07/27/2014 0812   VLDL 33 07/27/2014 0812   LDLCALC 64 09/21/2018 1136   LDLDIRECT 96 01/22/2013 0936   CMP     Component Value Date/Time   NA 142 09/21/2018 1136   K 5.0 09/21/2018 1136   CL 104 09/21/2018 1136   CO2 23 09/21/2018 1136   GLUCOSE 89 09/21/2018 1136   GLUCOSE 101 (H) 02/12/2018 0459   BUN 17 09/21/2018 1136   CREATININE 0.94 09/21/2018 1136   CREATININE 0.89 07/27/2014 0812   CALCIUM 10.4 (H) 09/21/2018 1136   PROT 6.8 09/21/2018 1136   ALBUMIN 4.6 09/21/2018 1136   AST 22 09/21/2018 1136   ALT 20 09/21/2018 1136   ALKPHOS 69 09/21/2018 1136   BILITOT 0.5 09/21/2018 1136   GFRNONAA 67 09/21/2018 1136   GFRAA 77 09/21/2018 1136     Recommendation:  Patient is here on a annual follow-up visit of coronary artery disease, fortunately she has not had any further episodes of angina pectoris, however she has noticed diffuse arthralgias involving her hands, shoulders, knees.  I suspect high-dose Lipitor may be the etiology.  She is also complaining of hair loss.  Advised her to hold off on taking Lipitor for a month and see if symptoms will resolve.  If they do resolve, will probably reduce the dose of Lipitor to 40 mg and add Zetia.  I reviewed recently performed labs from PCP.  If arthralgias resolved and hair loss to persist, then we will discontinue metoprolol.  If there is no change she needs to restart all the medications back.  Otherwise from cardiac standpoint she remained stable, blood pressure is well controlled.  Lipids were normal last year.  I'll see her back in a year.  09/25/2018, 8:26 AM [facility] Pager: [phone] Office: [phone] If no answer Cell [phone]

## 2018-09-25 ENCOUNTER — Encounter: Payer: Self-pay | Admitting: Cardiology

## 2018-09-25 ENCOUNTER — Telehealth: Payer: Self-pay | Admitting: Cardiology

## 2018-09-25 NOTE — Telephone Encounter (Signed)
Normal lipids. Liver enzymes and renal function normal. Elevated calcium minimal and probably non specific and normal previously.   Patient message sent

## 2018-09-28 ENCOUNTER — Ambulatory Visit (INDEPENDENT_AMBULATORY_CARE_PROVIDER_SITE_OTHER): Payer: BLUE CROSS/BLUE SHIELD | Admitting: Family Medicine

## 2018-09-28 ENCOUNTER — Encounter: Payer: Self-pay | Admitting: Family Medicine

## 2018-09-28 DIAGNOSIS — S82424D Nondisplaced transverse fracture of shaft of right fibula, subsequent encounter for closed fracture with routine healing: Secondary | ICD-10-CM

## 2018-09-28 DIAGNOSIS — R202 Paresthesia of skin: Secondary | ICD-10-CM | POA: Diagnosis not present

## 2018-09-28 DIAGNOSIS — M25561 Pain in right knee: Secondary | ICD-10-CM

## 2018-09-28 DIAGNOSIS — M25569 Pain in unspecified knee: Secondary | ICD-10-CM | POA: Insufficient documentation

## 2018-09-28 DIAGNOSIS — Z23 Encounter for immunization: Secondary | ICD-10-CM

## 2018-09-28 NOTE — Progress Notes (Signed)
CHIEF COMPLAINT / HPI: #1.  Right knee pain: Felt like it popped about 2 to 3 weeks ago almost feeling like it hyperextended.  Since then she has pain when it is bent.  Is so she keeps it straight it still bothering her.  Hurts a lot if she walks on it.  Had some mild swelling that got better.  No true locking.  No calf pain. #2.  Having a lot of generalized myalgias which she is unhappy about.  Right ankle where she has had previous fracture from the right elbow where she had previous surgery.  Cardiologist has taken off her lipid lowering for 2 weeks to see if that contributed. 3.  Occasionally awakens with numbness and tingling in her arms or fingers.  This goes away during the day.  Does not have it during activity.  No weakness.  Does not happen every night.  REVIEW OF SYSTEMS: See HPI.  Additional pertinent review of systems negative for fever, no unusual weight change.  Energy level is the same.  Baseline depressive symptoms remain.  PERTINENT  PMH / PSH: I have reviewed the patient's medications, allergies, past medical and surgical history, smoking status and updated in the EMR as appropriate.   OBJECTIVE:  Vital signs reviewed. GENERAL: Well-developed, well-nourished, no acute distress. CARDIOVASCULAR: Regular rate and rhythm no murmur gallop or rub LUNGS: Clear to auscultation bilaterally, no rales or wheeze. ABDOMEN: Soft positive bowel sounds NEURO: No gross focal neurological deficits. MSK: Movement of extremity x 4.  Right knee is without any sign of effusion.  Full range of motion flexion extension.  Positive McMurray.  Negative Lockman. PSYCH: AxOx4. Good eye contact.. No psychomotor retardation or agitation. Appropriate speech fluency and content. Asks and answers questions appropriately. Mood is congruent. NECK: Full range of motion.  Negative Spurling's.  Imaging review: I looked back at the CT scan of her neck from 2014 which shows significant arthritic changes at  C5-6.  PROCEDURE: INJECTION: Patient was given informed consent, signed copy in the chart. Appropriate time out was taken. Area prepped and draped in usual sterile fashion. Ethyl chloride was  used for local anesthesia. A 21 gauge 1 1/2 inch needle was used.. 1 cc of methylprednisolone 40 mg/ml plus 2 cc of 1% lidocaine without epinephrine was injected into the right knee using a(n) anterior medial approach.   The patient tolerated the procedure well. There were no complications. Post procedure instructions were given.   ASSESSMENT / PLAN:  Knee pain I suspect she has a small meniscal tear.  Discussed options.  She has had no improvement in the last 2 and half weeks so we will try corticosteroid injection today.  Follow-up via phone in 2 weeks.  Paresthesia Upper extremity and hand tingling intermittently upon awakening.  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  Suspect this is because.  Told her to get a new pillow to see if that would help.  If symptoms continue to worsen or if they become more frequent, she will let me know and we will repeat the CT of her neck will get an MRI.  Right fibular fracture History of right fibular fracture with intermittent right ankle pain now.  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   PREVENTIVE medicine: Flu shot today.  Discussed updating her colonoscopy.  She also evidently needs an EGD because she is having some issues with peristalsis.  She  agrees to set up an appointment with her GI specialist, Dr. Dulce Sellar.  She also needs a mammogram.  We will update as we can.

## 2018-09-28 NOTE — Assessment & Plan Note (Signed)
Upper extremity and hand tingling intermittently upon awakening.  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  Suspect this is because.  Told her to get a new pillow to see if that would help.  If symptoms continue to worsen or if they become more frequent, she will let me know and we will repeat the CT of her neck will get an MRI.

## 2018-09-28 NOTE — Assessment & Plan Note (Signed)
I suspect she has a small meniscal tear.  Discussed options.  She has had no improvement in the last 2 and half weeks so we will try corticosteroid injection today.  Follow-up via phone in 2 weeks.

## 2018-09-28 NOTE — Assessment & Plan Note (Signed)
History of right fibular fracture with intermittent right ankle pain now.  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

## 2018-10-20 ENCOUNTER — Other Ambulatory Visit: Payer: Self-pay | Admitting: Family Medicine

## 2018-10-23 ENCOUNTER — Other Ambulatory Visit: Payer: Self-pay | Admitting: Cardiology

## 2018-10-23 DIAGNOSIS — E7800 Pure hypercholesterolemia, unspecified: Secondary | ICD-10-CM

## 2018-10-23 MED ORDER — ROSUVASTATIN CALCIUM 20 MG PO TABS: 20.0000 mg | ORAL_TABLET | Freq: Every day | ORAL | 2 refills | Status: DC

## 2018-10-23 MED FILL — ROSUVASTATIN CALCIUM 20 MG: 20 | 30 days supply | Qty: 30 | Fill #0

## 2018-11-21 MED FILL — ROSUVASTATIN CALCIUM 20 MG: 20 | 30 days supply | Qty: 30 | Fill #1

## 2018-12-17 DIAGNOSIS — R110 Nausea: Secondary | ICD-10-CM | POA: Diagnosis not present

## 2018-12-17 DIAGNOSIS — K5900 Constipation, unspecified: Secondary | ICD-10-CM | POA: Diagnosis not present

## 2018-12-17 DIAGNOSIS — K219 Gastro-esophageal reflux disease without esophagitis: Secondary | ICD-10-CM | POA: Diagnosis not present

## 2018-12-25 MED FILL — ROSUVASTATIN CALCIUM 20 MG: 20 | 30 days supply | Qty: 30 | Fill #2

## 2019-01-05 ENCOUNTER — Inpatient Hospital Stay (HOSPITAL_BASED_OUTPATIENT_CLINIC_OR_DEPARTMENT_OTHER)
Admission: EM | Admit: 2019-01-05 | Discharge: 2019-01-07 | DRG: 864 | Disposition: A | Discharge status: Home / Self Care | Payer: BC Managed Care – PPO | Source: Ambulatory Visit | Attending: Internal Medicine | Admitting: Internal Medicine

## 2019-01-05 ENCOUNTER — Other Ambulatory Visit: Payer: Self-pay

## 2019-01-05 ENCOUNTER — Emergency Department (HOSPITAL_BASED_OUTPATIENT_CLINIC_OR_DEPARTMENT_OTHER): Payer: BC Managed Care – PPO

## 2019-01-05 ENCOUNTER — Encounter: Payer: Self-pay | Admitting: Family Medicine

## 2019-01-05 ENCOUNTER — Encounter (HOSPITAL_BASED_OUTPATIENT_CLINIC_OR_DEPARTMENT_OTHER): Payer: Self-pay | Admitting: *Deleted

## 2019-01-05 DIAGNOSIS — K449 Diaphragmatic hernia without obstruction or gangrene: Secondary | ICD-10-CM | POA: Diagnosis present

## 2019-01-05 DIAGNOSIS — Z20828 Contact with and (suspected) exposure to other viral communicable diseases: Secondary | ICD-10-CM | POA: Diagnosis not present

## 2019-01-05 DIAGNOSIS — R1013 Epigastric pain: Secondary | ICD-10-CM | POA: Diagnosis not present

## 2019-01-05 DIAGNOSIS — I2510 Atherosclerotic heart disease of native coronary artery without angina pectoris: Secondary | ICD-10-CM | POA: Diagnosis not present

## 2019-01-05 DIAGNOSIS — K279 Peptic ulcer, site unspecified, unspecified as acute or chronic, without hemorrhage or perforation: Secondary | ICD-10-CM | POA: Diagnosis not present

## 2019-01-05 DIAGNOSIS — Z1159 Encounter for screening for other viral diseases: Secondary | ICD-10-CM | POA: Diagnosis not present

## 2019-01-05 DIAGNOSIS — R6510 Systemic inflammatory response syndrome (SIRS) of non-infectious origin without acute organ dysfunction: Secondary | ICD-10-CM | POA: Diagnosis not present

## 2019-01-05 DIAGNOSIS — R110 Nausea: Secondary | ICD-10-CM | POA: Diagnosis not present

## 2019-01-05 DIAGNOSIS — E785 Hyperlipidemia, unspecified: Secondary | ICD-10-CM | POA: Diagnosis present

## 2019-01-05 DIAGNOSIS — Z90710 Acquired absence of both cervix and uterus: Secondary | ICD-10-CM | POA: Diagnosis not present

## 2019-01-05 DIAGNOSIS — R1310 Dysphagia, unspecified: Secondary | ICD-10-CM | POA: Diagnosis not present

## 2019-01-05 DIAGNOSIS — Z6833 Body mass index (BMI) 33.0-33.9, adult: Secondary | ICD-10-CM | POA: Diagnosis not present

## 2019-01-05 DIAGNOSIS — Z881 Allergy status to other antibiotic agents status: Secondary | ICD-10-CM

## 2019-01-05 DIAGNOSIS — Z79899 Other long term (current) drug therapy: Secondary | ICD-10-CM | POA: Diagnosis not present

## 2019-01-05 DIAGNOSIS — R1084 Generalized abdominal pain: Secondary | ICD-10-CM | POA: Diagnosis not present

## 2019-01-05 DIAGNOSIS — R509 Fever, unspecified: Secondary | ICD-10-CM | POA: Diagnosis present

## 2019-01-05 DIAGNOSIS — Z882 Allergy status to sulfonamides status: Secondary | ICD-10-CM | POA: Diagnosis not present

## 2019-01-05 DIAGNOSIS — E669 Obesity, unspecified: Secondary | ICD-10-CM | POA: Diagnosis not present

## 2019-01-05 DIAGNOSIS — I10 Essential (primary) hypertension: Secondary | ICD-10-CM | POA: Diagnosis not present

## 2019-01-05 DIAGNOSIS — Z880 Allergy status to penicillin: Secondary | ICD-10-CM

## 2019-01-05 DIAGNOSIS — K219 Gastro-esophageal reflux disease without esophagitis: Secondary | ICD-10-CM | POA: Diagnosis not present

## 2019-01-05 DIAGNOSIS — Z955 Presence of coronary angioplasty implant and graft: Secondary | ICD-10-CM | POA: Diagnosis not present

## 2019-01-05 DIAGNOSIS — F419 Anxiety disorder, unspecified: Secondary | ICD-10-CM | POA: Diagnosis not present

## 2019-01-05 DIAGNOSIS — R6881 Early satiety: Secondary | ICD-10-CM | POA: Diagnosis not present

## 2019-01-05 DIAGNOSIS — R109 Unspecified abdominal pain: Secondary | ICD-10-CM | POA: Diagnosis not present

## 2019-01-05 DIAGNOSIS — R5082 Postprocedural fever: Secondary | ICD-10-CM | POA: Diagnosis not present

## 2019-01-05 DIAGNOSIS — K5900 Constipation, unspecified: Secondary | ICD-10-CM | POA: Diagnosis not present

## 2019-01-05 DIAGNOSIS — E7800 Pure hypercholesterolemia, unspecified: Secondary | ICD-10-CM | POA: Diagnosis not present

## 2019-01-05 LAB — CBC
HCT: 42.7 % (ref 36.0–46.0)
Hemoglobin: 13.5 g/dL (ref 12.0–15.0)
MCH: 28.7 pg (ref 26.0–34.0)
MCHC: 31.6 g/dL (ref 30.0–36.0)
MCV: 90.9 fL (ref 80.0–100.0)
Platelets: 289 10*3/uL (ref 150–400)
RBC: 4.7 MIL/uL (ref 3.87–5.11)
RDW: 12.6 % (ref 11.5–15.5)
WBC: 16.2 10*3/uL — ABNORMAL HIGH (ref 4.0–10.5)
nRBC: 0 % (ref 0.0–0.2)

## 2019-01-05 LAB — LACTIC ACID, PLASMA: Lactic Acid, Venous: 1.6 mmol/L (ref 0.5–1.9)

## 2019-01-05 LAB — COMPREHENSIVE METABOLIC PANEL
ALT: 19 U/L (ref 0–44)
AST: 22 U/L (ref 15–41)
Albumin: 4.7 g/dL (ref 3.5–5.0)
Alkaline Phosphatase: 57 U/L (ref 38–126)
Anion gap: 11 (ref 5–15)
BUN: 13 mg/dL (ref 6–20)
CO2: 22 mmol/L (ref 22–32)
Calcium: 10.1 mg/dL (ref 8.9–10.3)
Chloride: 107 mmol/L (ref 98–111)
Creatinine, Ser: 0.97 mg/dL (ref 0.44–1.00)
GFR calc Af Amer: >60 mL/min (ref >60)
GFR calc non Af Amer: >60 mL/min (ref >60)
Glucose, Bld: 109 mg/dL — ABNORMAL HIGH (ref 70–99)
Potassium: 3.6 mmol/L (ref 3.5–5.1)
Sodium: 140 mmol/L (ref 135–145)
Total Bilirubin: 0.8 mg/dL (ref 0.3–1.2)
Total Protein: 8 g/dL (ref 6.5–8.1)

## 2019-01-05 LAB — URINALYSIS, ROUTINE W REFLEX MICROSCOPIC
Bilirubin Urine: NEGATIVE
Glucose, UA: NEGATIVE mg/dL
Hgb urine dipstick: NEGATIVE
Ketones, ur: 15 mg/dL — AB
Leukocytes,Ua: NEGATIVE
Nitrite: NEGATIVE
Protein, ur: NEGATIVE mg/dL
Specific Gravity, Urine: >1.03 — ABNORMAL HIGH (ref 1.005–1.030)
pH: 5.5 (ref 5.0–8.0)

## 2019-01-05 LAB — LIPASE, BLOOD: Lipase: 31 U/L (ref 11–51)

## 2019-01-05 LAB — SARS CORONAVIRUS 2 AG (30 MIN TAT): SARS Coronavirus 2 Ag: NEGATIVE

## 2019-01-05 MED ORDER — FENTANYL CITRATE (PF) 100 MCG/2ML IJ SOLN
50.0000 ug | Freq: Once | INTRAMUSCULAR | Status: AC
  Administered 2019-01-05: 50 ug via INTRAVENOUS
  Filled 2019-01-05: qty 2

## 2019-01-05 MED ORDER — ONDANSETRON 8 MG PO TBDP
8.0000 mg | ORAL_TABLET | Freq: Once | ORAL | Status: AC
  Administered 2019-01-05: 23:00:00 8 mg via ORAL
  Filled 2019-01-05: qty 1

## 2019-01-05 MED ORDER — LEVOFLOXACIN IN D5W 750 MG/150ML IV SOLN
750.0000 mg | Freq: Once | INTRAVENOUS | Status: DC
  Administered 2019-01-05: 750 mg via INTRAVENOUS
  Filled 2019-01-05: qty 150

## 2019-01-05 MED ORDER — SODIUM CHLORIDE 0.9% FLUSH
3.0000 mL | Freq: Once | INTRAVENOUS | Status: DC
  Filled 2019-01-05: qty 3

## 2019-01-05 MED ORDER — ONDANSETRON HCL 4 MG/2ML IJ SOLN
4.0000 mg | Freq: Once | INTRAMUSCULAR | Status: AC
  Administered 2019-01-05: 4 mg via INTRAVENOUS
  Filled 2019-01-05: qty 2

## 2019-01-05 MED ORDER — METRONIDAZOLE IN NACL 5-0.79 MG/ML-% IV SOLN
500.0000 mg | Freq: Once | INTRAVENOUS | Status: DC
  Filled 2019-01-05: qty 100

## 2019-01-05 MED ORDER — IOHEXOL 300 MG/ML  SOLN
100.0000 mL | Freq: Once | INTRAMUSCULAR | Status: AC | PRN
  Administered 2019-01-05: 21:00:00 100 mL via INTRAVENOUS

## 2019-01-05 MED ORDER — SODIUM CHLORIDE 0.9 % IV SOLN
INTRAVENOUS | Status: DC | PRN
  Administered 2019-01-05: 21:00:00 1000 mL via INTRAVENOUS

## 2019-01-05 MED ORDER — METRONIDAZOLE IN NACL 5-0.79 MG/ML-% IV SOLN
500.0000 mg | Freq: Three times a day (TID) | INTRAVENOUS | Status: DC
  Administered 2019-01-05 – 2019-01-06 (×3): 500 mg via INTRAVENOUS
  Filled 2019-01-05 (×2): qty 100

## 2019-01-05 MED ORDER — ACETAMINOPHEN 325 MG PO TABS
650.0000 mg | ORAL_TABLET | Freq: Once | ORAL | Status: AC
  Administered 2019-01-05: 650 mg via ORAL
  Filled 2019-01-05: qty 2

## 2019-01-05 MED ORDER — SODIUM CHLORIDE 0.9 % IV SOLN
2.0000 g | INTRAVENOUS | Status: DC
  Administered 2019-01-05: 2 g via INTRAVENOUS
  Filled 2019-01-05 (×2): qty 20

## 2019-01-05 NOTE — ED Notes (Signed)
ED Provider at bedside. 

## 2019-01-05 NOTE — ED Notes (Signed)
Pt given water and crackers for PO challenge

## 2019-01-05 NOTE — ED Notes (Signed)
levaquin d/c at this time.

## 2019-01-05 NOTE — ED Notes (Signed)
1st set of blood cultures obtained at 1841 from [facility]

## 2019-01-05 NOTE — ED Provider Notes (Addendum)
[facility] EMERGENCY DEPARTMENT Provider Note   CSN: [id number] Arrival date & time: 01/05/19  1809    History   Chief Complaint Chief Complaint  Patient presents with  . Fever    HPI Miranda Baker is a 58 y.o. female.     HPI  58 year old with history of hypertension, hyperlipidemia, CAD comes in a chief complaint of abdominal pain.  Patient reports that she had an upper and lower endoscopy earlier today.  Soon after she arrived home she started feeling cold and then she started developing chills.  Patient took her temperature and she had a fever of 102, and the GI doctors advised her to come to the ER.  Patient is also having epigastric abdominal pain that is described as sharp pain at about 8 out of 10.  She denies any associated nausea, vomiting, diarrhea.  Patient does have associated chills and weakness.  She denies any UTI-like symptoms, cough, headaches.  No known sick contacts.  Past Medical History:  Diagnosis Date  . Cellulitis   . Constipation   . Coronary artery disease    Stent 2.75x15 Xience 2011  . GERD (gastroesophageal reflux disease)   . Hyperlipidemia   . Hypertension   . Laboratory examination 09/24/2018  . MI (mitral incompetence)   . Paresthesia 09/24/2018    Patient Active Problem List   Diagnosis Date Noted  . Knee pain 09/28/2018  . Laboratory examination 09/24/2018  . Paresthesia 09/24/2018  . Panic disorder (episodic paroxysmal anxiety) 03/23/2018  . Other fatigue 02/18/2018  . Gross hematuria 02/18/2018  . Right fibular fracture 11/04/2016  . Hyperlipidemia 09/27/2014  . Radiculopathy, lumbar region 09/26/2014  . Attention and concentration deficit 08/13/2012  . Postsurgical percutaneous transluminal coronary angioplasty (PTCA) status 01/21/2012  . Allergic rhinitis due to other allergen 10/28/2011  . Dysthymia 01/09/2011  . Sinusitis, chronic 09/24/2010  . Other complicated headache syndrome 05/20/2010  . Coronary  atherosclerosis 11/28/2009  . GERD, SEVERE 07/21/2009  . CONSTIPATION 05/17/2009  . Essential hypertension 04/17/2009  . OTHER TEAR CARTILAGE OR MENISCUS KNEE CURRENT 07/19/2008  . LATERAL EPICONDYLITIS, RIGHT 07/13/2007    Past Surgical History:  Procedure Laterality Date  . ABDOMINAL HYSTERECTOMY  [employer]  . ESOPHAGOGASTRODUODENOSCOPY N/A 10/07/2012   Procedure: ESOPHAGOGASTRODUODENOSCOPY (EGD);  Surgeon: Willis ModenaWilliam Outlaw, MD;  [facility];  Service: Endoscopy;  Laterality: N/A;  . HERNIA REPAIR    . lap for adhesions    . LEFT HEART CATHETERIZATION WITH CORONARY ANGIOGRAM N/A 01/21/2012   Procedure: LEFT HEART CATHETERIZATION WITH CORONARY ANGIOGRAM;  Surgeon: Pamella PertJagadeesh R Ganji, MD;  [facility];  Service: Cardiovascular;  Laterality: N/A;  . OOPHORECTOMY       OB History   No obstetric history on file.      Home Medications    Prior to Admission medications   Medication Sig Start Date End Date Taking? Authorizing Provider  ALPRAZolam Prudy Feeler(XANAX) 1 MG tablet TAKE ONE TABLET BY MOUTH UP TO 3 TIMES DAILY AS NEEDED FOR ANXIETY 08/06/18  Yes Nestor RampNeal, Sara L, MD  citalopram (CELEXA) 40 MG tablet TAKE ONE TABLET BY MOUTH AT BEDTIME 11/10/17  Yes Nestor RampNeal, Sara L, MD  ezetimibe (ZETIA) 10 MG tablet TAKE ONE TABLET BY MOUTH DAILY 11/10/17  Yes Nestor RampNeal, Sara L, MD  hydrochlorothiazide (HYDRODIURIL) 25 MG tablet Take one half tab daily by mouth 06/01/18  Yes Nestor RampNeal, Sara L, MD  metoprolol tartrate (LOPRESSOR) 25 MG tablet TAKE ONE TABLET BY MOUTH DAILY 10/20/18  Yes Jennette KettleNeal,  Roxine Caddy, MD  montelukast (SINGULAIR) 10 MG tablet Take 1 tablet (10 mg total) by mouth at bedtime. 03/06/18  Yes [name], MD  nitroGLYCERIN (NITROSTAT) 0.4 MG SL tablet Place 1 tablet (0.4 mg total) under the tongue every 5 (five) minutes x 3 doses as needed for chest pain. 07/31/15 02/12/26 Yes [name], MD  olmesartan (BENICAR) 40 MG tablet Take 1 tablet (40 mg total) by mouth daily. 06/01/18  Yes [name], MD   PATADAY 0.2 % SOLN INSTILL ONE DROP INTO THE AFFECTED EYE(S) ONCE DAILY AS DIRECTED 10/10/16  Yes [name], MD  rosuvastatin (CRESTOR) 20 MG tablet Take 1 tablet (20 mg total) by mouth daily. 10/23/18 01/21/19 Yes Yates Decamp, MD  sucralfate (CARAFATE) 1 g tablet TAKE ONE TABLET BY MOUTH FOUR (4) TIMES DAILY WITH MEALS AND AT BEDTIME 02/25/17  Yes [name], MD  traMADol (ULTRAM) 50 MG tablet TAKE 1 TO 2 TABLETS BY MOUTH EVERY 8-12 HOURS AS NEEDED 08/06/18  Yes [name], MD  methylphenidate (METADATE ER) 20 MG ER tablet TAKE ONE TABLET BY MOUTH EACH MORNING AND REPEAT ONCE AT NOON DAILY AS DIRECTED 06/10/18   [name], MD  pantoprazole (PROTONIX) 40 MG tablet TAKE ONE TABLET BY MOUTH TWICE DAILY 09/01/18   [name], MD    Family History No family history on file.  Social History Social History   Tobacco Use  . Smoking status: Never Smoker  . Smokeless tobacco: Never Used  Substance Use Topics  . Alcohol use: No  . Drug use: No     Allergies   Erythromycin; Morphine and related; Penicillins; Tetracyclines & related; Vibramycin [doxycycline calcium]; and Bactrim [sulfamethoxazole-trimethoprim]   Review of Systems Review of Systems  Constitutional: Positive for activity change, chills and fever.  Respiratory: Negative for cough and shortness of breath.   Cardiovascular: Negative for chest pain.  Gastrointestinal: Positive for abdominal pain.  Genitourinary: Negative for dysuria.  Musculoskeletal: Negative for arthralgias and back pain.  Allergic/Immunologic: Negative for immunocompromised state.  All other systems reviewed and are negative.    Physical Exam Updated Vital Signs BP 131/74   Pulse 93   Temp (!) 100.8 F (38.2 C) (Oral)   Resp 18   Ht  (1.626 m)   Wt 87.5 kg   SpO2 95%   BMI 33.13 kg/m   Physical Exam Vitals signs and nursing note reviewed.  Constitutional:      Appearance: She is well-developed.  HENT:     Head: Normocephalic and  atraumatic.  Neck:     Musculoskeletal: Normal range of motion and neck supple.  Cardiovascular:     Rate and Rhythm: Normal rate.  Pulmonary:     Effort: Pulmonary effort is normal.  Abdominal:     General: Bowel sounds are normal.     Palpations: Abdomen is soft.     Tenderness: There is abdominal tenderness. There is guarding. There is no rebound.     Comments: Generalized tenderness that is worse over the epigastric region where she is guarding  Skin:    General: Skin is warm and dry.     Findings: Rash present.     Comments: Skin is flushed, there is erythematous rash over the torso  Neurological:     Mental Status: She is alert and oriented to person, place, and time.      ED Treatments / Results  Labs (all labs ordered are listed, but only  abnormal results are displayed) Labs Reviewed  COMPREHENSIVE METABOLIC PANEL - Abnormal; Notable for the following components:      Result Value   Glucose, Bld 109 (*)    All other components within normal limits  CBC - Abnormal; Notable for the following components:   WBC 16.2 (*)    All other components within normal limits  URINALYSIS, ROUTINE W REFLEX MICROSCOPIC - Abnormal; Notable for the following components:   Specific Gravity, Urine >1.030 (*)    Ketones, ur 15 (*)    All other components within normal limits  SARS CORONAVIRUS 2 (HOSP ORDER, PERFORMED IN [place] [facility] VIA ABBOTT ID)  CULTURE, BLOOD (ROUTINE X 2)  CULTURE, BLOOD (ROUTINE X 2)  URINE CULTURE  LIPASE, BLOOD  LACTIC ACID, PLASMA  LACTIC ACID, PLASMA    EKG EKG Interpretation  Date/Time:  Tuesday January 05 2019 20:31:03 EDT Ventricular Rate:  102 PR Interval:    QRS Duration: 80 QT Interval:  345 QTC Calculation: 450 R Axis:   25 Text Interpretation:  Sinus tachycardia Borderline repolarization abnormality No acute changes No significant change since last tracing Nonspecific ST and T wave abnormality Confirmed by Derwood Kaplan [phone]) on 01/05/2019  8:49:30 PM   Radiology Ct Abdomen Pelvis W Contrast  Result Date: 01/05/2019 CLINICAL DATA:  Acute epigastric abdominal pain. EXAM: CT ABDOMEN AND PELVIS WITH CONTRAST TECHNIQUE: Multidetector CT imaging of the abdomen and pelvis was performed using the standard protocol following bolus administration of intravenous contrast. CONTRAST:  OMNIPAQUE IOHEXOL 300 MG/ML  SOLN COMPARISON:  CT scan of February 12, 2018. FINDINGS: Lower chest: No acute abnormality. Hepatobiliary: No focal liver abnormality is seen. No gallstones, gallbladder wall thickening, or biliary dilatation. Pancreas: Unremarkable. No pancreatic ductal dilatation or surrounding inflammatory changes. Spleen: Normal in size without focal abnormality. Adrenals/Urinary Tract: Adrenal glands are unremarkable. Kidneys are normal, without renal calculi, focal lesion, or hydronephrosis. Bladder is unremarkable. Stomach/Bowel: Stomach is within normal limits. Appendix appears normal. No evidence of bowel wall thickening, distention, or inflammatory changes. Vascular/Lymphatic: Aortic atherosclerosis. No enlarged abdominal or pelvic lymph nodes. Reproductive: Status post hysterectomy. No adnexal masses. Other: No abdominal wall hernia or abnormality. No abdominopelvic ascites. Musculoskeletal: No acute or significant osseous findings. IMPRESSION: No acute abnormality seen in the abdomen or pelvis. Aortic Atherosclerosis (ICD10-I70.0). Electronically Signed   By: Lupita Raider M.D.   On: 01/05/2019 21:27   Dg Chest Port 1 View  Result Date: 01/05/2019 CLINICAL DATA:  Epigastric abdominal pain and fever for the past 3 hours. Nausea. EXAM: PORTABLE CHEST 1 VIEW COMPARISON:  08/29/2016. FINDINGS: Normal sized heart. Clear lungs with normal vascularity. Minimal right shoulder degenerative changes. IMPRESSION: No acute abnormality. Electronically Signed   By: Beckie Salts M.D.   On: 01/05/2019 21:21    Procedures Procedures (including critical care  time)  Medications Ordered in ED Medications  sodium chloride flush (NS) 0.9 % injection 3 mL (3 mLs Intravenous Not Given 01/05/19 2026)  0.9 %  sodium chloride infusion (1,000 mLs Intravenous New Bag/Given 01/05/19 2043)  cefTRIAXone (ROCEPHIN) 2 g in sodium chloride 0.9 % 100 mL IVPB (2 g Intravenous New Bag/Given 01/05/19 2216)  metroNIDAZOLE (FLAGYL) IVPB 500 mg (500 mg Intravenous New Bag/Given 01/05/19 2117)  ondansetron (ZOFRAN-ODT) disintegrating tablet 8 mg (has no administration in time range)  ondansetron (ZOFRAN) injection 4 mg (4 mg Intravenous Given 01/05/19 2008)  fentaNYL (SUBLIMAZE) injection 50 mcg (50 mcg Intravenous Given 01/05/19 2039)  acetaminophen (TYLENOL) tablet 650 mg (  650 mg Oral Given 01/05/19 2039)  iohexol (OMNIPAQUE) 300 MG/ML solution 100 mL (100 mLs Intravenous Contrast Given 01/05/19 2057)     Initial Impression / Assessment and Plan / ED Course  I have reviewed the triage vital signs and the nursing notes.  Pertinent labs & imaging results that were available during my care of the patient were reviewed by me and considered in my medical decision making (see chart for details).  Clinical Course as of Jan 04 2229  Tue Jan 05, 2019  2228 Results of the ER work-up discussed with the patient.  She continues to have epigastric tenderness but feels better after morphine.  I discussed the case with Dr. Randa Evens, GI.  He recommends that it might be better to admit patient overnight and for them to round on her in the morning.  When I went in to discuss this plan with the patient she informed me that she is feeling worse again.  She is having worsening nausea and abdominal discomfort.  She has anorexia and was unable to tolerate any food or fluids at home therefore she is comfortable being admitted for observation status.  CT ABDOMEN PELVIS W CONTRAST [AN]    Clinical Course User Index [AN] Derwood Kaplan, MD       58 year old female with history of CAD comes in with  chief complaint of abdominal pain.  She is status post upper and lower endoscopies earlier today and started developing fevers and chills after she left the recovery area.  On exam she has generalized abdominal tenderness that is worse in the upper quadrants.  She has no other symptoms of infection.   Plan is to get basic labs and a CT scan of the abdomen and pelvis.  We would want to ensure that patient does not have perforated viscus.  Aspiration pneumonitis is also possible, chest x-ray ordered.  10:30 PM The other addition included in the differential would include delayed hypersensitivity reaction.  Patient has a rash and appears flushed.  She is having nausea and abdominal discomfort.  Fever can also be possibly a sign of allergic reaction.  However, she is having chills and tachycardia which makes it difficult for [place] to presume that she has hypersensitivity reaction.  I will add sed rate and CRP. No signs of endorgan damage.  Patient does not have severe sepsis.  Final Clinical Impressions(s) / ED Diagnoses   Final diagnoses:  Post-procedural fever  SIRS (systemic inflammatory response syndrome) [facility])    ED Discharge Orders    None       Derwood Kaplan, MD 01/05/19 2231    Derwood Kaplan, MD 01/05/19 2232

## 2019-01-05 NOTE — ED Triage Notes (Signed)
Epigastric pain and fever x 3 hours . Nausea. She had an endoscopy and colonoscopy this am.

## 2019-01-05 NOTE — ED Notes (Signed)
[facility] notified (Tara) - patient ready for transport 

## 2019-01-05 NOTE — Progress Notes (Signed)
Pharmacy Antibiotic Note  Miranda Baker is a 58 y.o. female admitted on 01/05/2019 with sepsis.  Pharmacy has been consulted for Levaquin dosing. Per protocol, patient will be switched to ceftriaxone + Flagyl. PCN allergy listed is "not specified," and patient has tolerated ceftriaxone and Keflex in the past per Epic history. SCr 0.97 on admit.  Levaquin 750mg  IV x 1 already given in the ER at 2045.  Plan: D/c Levaquin consult per protocol >> ceftriaxone 2g IV q24h + Flagyl 500mg  IV q8h to start tomorrow Monitor clinical progress, c/s, renal function F/u de-escalation plan/LOT  Height: 5\' 4"  (162.6 cm) Weight: 193 lb (87.5 kg) IBW/kg (Calculated) : 54.7  Temp (24hrs), Avg:100.9 F (38.3 C), Min:100.6 F (38.1 C), Max:101.1 F (38.4 C)  Recent Labs  Lab 01/05/19 1835 01/05/19 2020  WBC 16.2*  --   CREATININE 0.97  --   LATICACIDVEN  --  1.6    Estimated Creatinine Clearance: 66.8 mL/min (by C-G formula based on SCr of 0.97 mg/dL).    Allergies  Allergen Reactions  . Erythromycin Other (See Comments)    Reaction unknown  . Morphine And Related Other (See Comments)    Reaction unknown  . Penicillins Other (See Comments)    Reaction unknown  . Tetracyclines & Related Other (See Comments)    Reaction unknown  . Vibramycin [Doxycycline Calcium] Other (See Comments)    Reaction unknown  . Bactrim [Sulfamethoxazole-Trimethoprim] Nausea Only    Intolerance only    Babs Bertin, PharmD, BCPS Please check AMION for all [facility] contact numbers Clinical Pharmacist 01/05/2019 8:48 PM

## 2019-01-06 ENCOUNTER — Other Ambulatory Visit: Payer: Self-pay

## 2019-01-06 DIAGNOSIS — Z90710 Acquired absence of both cervix and uterus: Secondary | ICD-10-CM | POA: Diagnosis not present

## 2019-01-06 DIAGNOSIS — R110 Nausea: Secondary | ICD-10-CM

## 2019-01-06 DIAGNOSIS — R509 Fever, unspecified: Secondary | ICD-10-CM

## 2019-01-06 DIAGNOSIS — F419 Anxiety disorder, unspecified: Secondary | ICD-10-CM | POA: Diagnosis present

## 2019-01-06 DIAGNOSIS — K219 Gastro-esophageal reflux disease without esophagitis: Secondary | ICD-10-CM | POA: Diagnosis present

## 2019-01-06 DIAGNOSIS — Z79899 Other long term (current) drug therapy: Secondary | ICD-10-CM | POA: Diagnosis not present

## 2019-01-06 DIAGNOSIS — I2510 Atherosclerotic heart disease of native coronary artery without angina pectoris: Secondary | ICD-10-CM

## 2019-01-06 DIAGNOSIS — E669 Obesity, unspecified: Secondary | ICD-10-CM | POA: Diagnosis present

## 2019-01-06 DIAGNOSIS — R5082 Postprocedural fever: Secondary | ICD-10-CM | POA: Diagnosis not present

## 2019-01-06 DIAGNOSIS — Z955 Presence of coronary angioplasty implant and graft: Secondary | ICD-10-CM | POA: Diagnosis not present

## 2019-01-06 DIAGNOSIS — R1013 Epigastric pain: Secondary | ICD-10-CM

## 2019-01-06 DIAGNOSIS — K449 Diaphragmatic hernia without obstruction or gangrene: Secondary | ICD-10-CM | POA: Diagnosis present

## 2019-01-06 DIAGNOSIS — I10 Essential (primary) hypertension: Secondary | ICD-10-CM

## 2019-01-06 DIAGNOSIS — K279 Peptic ulcer, site unspecified, unspecified as acute or chronic, without hemorrhage or perforation: Secondary | ICD-10-CM | POA: Diagnosis not present

## 2019-01-06 DIAGNOSIS — E7800 Pure hypercholesterolemia, unspecified: Secondary | ICD-10-CM

## 2019-01-06 DIAGNOSIS — Z882 Allergy status to sulfonamides status: Secondary | ICD-10-CM | POA: Diagnosis not present

## 2019-01-06 DIAGNOSIS — Z6833 Body mass index (BMI) 33.0-33.9, adult: Secondary | ICD-10-CM | POA: Diagnosis not present

## 2019-01-06 DIAGNOSIS — Z1159 Encounter for screening for other viral diseases: Secondary | ICD-10-CM | POA: Diagnosis not present

## 2019-01-06 DIAGNOSIS — Z881 Allergy status to other antibiotic agents status: Secondary | ICD-10-CM | POA: Diagnosis not present

## 2019-01-06 DIAGNOSIS — Z880 Allergy status to penicillin: Secondary | ICD-10-CM | POA: Diagnosis not present

## 2019-01-06 DIAGNOSIS — E785 Hyperlipidemia, unspecified: Secondary | ICD-10-CM | POA: Diagnosis not present

## 2019-01-06 LAB — CBC WITH DIFFERENTIAL/PLATELET
Abs Immature Granulocytes: 0.04 10*3/uL (ref 0.00–0.07)
Basophils Absolute: 0 10*3/uL (ref 0.0–0.1)
Basophils Relative: 0 %
Eosinophils Absolute: 0.1 10*3/uL (ref 0.0–0.5)
Eosinophils Relative: 0 %
HCT: 36.2 % (ref 36.0–46.0)
Hemoglobin: 11.5 g/dL — ABNORMAL LOW (ref 12.0–15.0)
Immature Granulocytes: 0 %
Lymphocytes Relative: 21 %
Lymphs Abs: 2.4 10*3/uL (ref 0.7–4.0)
MCH: 29.4 pg (ref 26.0–34.0)
MCHC: 31.8 g/dL (ref 30.0–36.0)
MCV: 92.6 fL (ref 80.0–100.0)
Monocytes Absolute: 0.6 10*3/uL (ref 0.1–1.0)
Monocytes Relative: 6 %
Neutro Abs: 8.2 10*3/uL — ABNORMAL HIGH (ref 1.7–7.7)
Neutrophils Relative %: 73 %
Platelets: 232 10*3/uL (ref 150–400)
RBC: 3.91 MIL/uL (ref 3.87–5.11)
RDW: 12.8 % (ref 11.5–15.5)
WBC: 11.3 10*3/uL — ABNORMAL HIGH (ref 4.0–10.5)
nRBC: 0 % (ref 0.0–0.2)

## 2019-01-06 LAB — URINE CULTURE

## 2019-01-06 LAB — C-REACTIVE PROTEIN: CRP: <0.8 mg/dL (ref <1.0)

## 2019-01-06 LAB — HIV ANTIBODY (ROUTINE TESTING W REFLEX): HIV Screen 4th Generation wRfx: NONREACTIVE

## 2019-01-06 LAB — TROPONIN I: Troponin I: <0.03 ng/mL (ref <0.03)

## 2019-01-06 LAB — SEDIMENTATION RATE: Sed Rate: 22 mm/hr (ref 0–22)

## 2019-01-06 MED ORDER — EZETIMIBE 10 MG PO TABS
10.0000 mg | ORAL_TABLET | Freq: Every day | ORAL | Status: DC
  Administered 2019-01-06: 10 mg via ORAL
  Filled 2019-01-06: qty 1

## 2019-01-06 MED ORDER — MONTELUKAST SODIUM 10 MG PO TABS
10.0000 mg | ORAL_TABLET | Freq: Every day | ORAL | Status: DC
  Administered 2019-01-06: 10 mg via ORAL
  Filled 2019-01-06: qty 1

## 2019-01-06 MED ORDER — ACETAMINOPHEN 325 MG PO TABS
650.0000 mg | ORAL_TABLET | Freq: Four times a day (QID) | ORAL | Status: DC | PRN
  Administered 2019-01-06: 650 mg via ORAL
  Filled 2019-01-06: qty 2

## 2019-01-06 MED ORDER — ACETAMINOPHEN 650 MG RE SUPP: 650.0000 mg | Freq: Four times a day (QID) | RECTAL | Status: DC | PRN

## 2019-01-06 MED ORDER — ROSUVASTATIN CALCIUM 20 MG PO TABS
20.0000 mg | ORAL_TABLET | Freq: Every day | ORAL | Status: DC
  Administered 2019-01-06 – 2019-01-07 (×2): 20 mg via ORAL
  Filled 2019-01-06 (×2): qty 1

## 2019-01-06 MED ORDER — PANTOPRAZOLE SODIUM 40 MG IV SOLR
40.0000 mg | Freq: Every day | INTRAVENOUS | Status: DC
  Administered 2019-01-06 – 2019-01-07 (×2): 40 mg via INTRAVENOUS
  Filled 2019-01-06 (×2): qty 40

## 2019-01-06 MED ORDER — METOPROLOL TARTRATE 25 MG PO TABS
25.0000 mg | ORAL_TABLET | Freq: Every day | ORAL | Status: DC
  Administered 2019-01-06 – 2019-01-07 (×2): 25 mg via ORAL
  Filled 2019-01-06 (×2): qty 1

## 2019-01-06 MED ORDER — HYDROCHLOROTHIAZIDE 12.5 MG PO CAPS
12.5000 mg | ORAL_CAPSULE | Freq: Every day | ORAL | Status: DC
  Administered 2019-01-06 – 2019-01-07 (×2): 12.5 mg via ORAL
  Filled 2019-01-06 (×2): qty 1

## 2019-01-06 MED ORDER — ONDANSETRON HCL 4 MG/2ML IJ SOLN
4.0000 mg | Freq: Four times a day (QID) | INTRAMUSCULAR | Status: DC | PRN
  Administered 2019-01-06: 4 mg via INTRAVENOUS
  Filled 2019-01-06: qty 2

## 2019-01-06 MED ORDER — IRBESARTAN 300 MG PO TABS
300.0000 mg | ORAL_TABLET | Freq: Every day | ORAL | Status: DC
  Administered 2019-01-06 – 2019-01-07 (×2): 300 mg via ORAL
  Filled 2019-01-06 (×2): qty 1

## 2019-01-06 MED ORDER — ALPRAZOLAM 1 MG PO TABS: 1.0000 mg | ORAL_TABLET | Freq: Three times a day (TID) | ORAL | Status: DC | PRN

## 2019-01-06 MED ORDER — ENOXAPARIN SODIUM 40 MG/0.4ML ~~LOC~~ SOLN
40.0000 mg | SUBCUTANEOUS | Status: DC
  Administered 2019-01-06 – 2019-01-07 (×2): 40 mg via SUBCUTANEOUS
  Filled 2019-01-06 (×2): qty 0.4

## 2019-01-06 MED ORDER — ALPRAZOLAM 1 MG PO TABS
1.0000 mg | ORAL_TABLET | Freq: Three times a day (TID) | ORAL | Status: DC | PRN
  Administered 2019-01-06 (×2): 1 mg via ORAL
  Filled 2019-01-06 (×2): qty 1

## 2019-01-06 MED ORDER — DEXTROSE-NACL 5-0.9 % IV SOLN
INTRAVENOUS | Status: DC
  Administered 2019-01-06: 17:00:00 via INTRAVENOUS

## 2019-01-06 NOTE — Progress Notes (Signed)
PROGRESS NOTE    Miranda Baker  [id number] DOB: 1961-03-11 DOA: 01/05/2019 PCP: Nestor RampNeal, Sara L, MD    Brief Narrative:  58 year old female who presented with epigastric pain and fever.  She does have significant past medical history for coronary disease, dyslipidemia and GERD.  Reported intermittent epigastric domino pain for the last few years, underwent upper endoscopy and colonoscopy day before admission, she was found to have a hiatal hernia.  Post procedure she experienced excruciating epigastric pain after trying to sip of water, associated with abdominal pain and nausea, temperature 102 F.  On initial physical examination her temperature was 101.1 F, blood pressure 113/64, heart rate 90, respirate 15, oxygen saturation 94%.  She had mild face facial flushing, lungs were clear to auscultation bilaterally, heart S1-S2 present rhythmic, her abdomen was tender to palpation.  Her CT of the abdomen showed no acute abnormalities. Chest radiograph with no infiltrates. .   Patient was admitted to the hospital working diagnosis of febrile syndrome status post endoscopic procedure.    Assessment & Plan:   Principal Problem:   Fever Active Problems:   HTN (hypertension)   Hyperlipidemia   Epigastric abdominal pain   Nausea   CAD (coronary artery disease)   1. Febrile syndrome. Continue close monitoring of temperature curve, SARS covid has been negative. CT abdomen with no acute changes. Chest radiograph with no infiltrates. Will hold on antibiotic therapy for now and follow up cell count, temperature curve and cultures. Will continue IV fluids with D5 NS at 50 ml per H.   2. HTN. Continue blood pressure monitoring. Continue blood pressure control with HCTZ, metoprolol and irbesartan  3. CAD. No active chest pain.   4. Obesity. Calculated BMI is 33,4.   5. Anxiety. Will resume alprazolam.   6. Peptic ulcer disease. Continue pantoprazole and as needed IV antiemetics.   DVT  prophylaxis: enoxaparin   Code Status: full Family Communication: no family at the bedside  Disposition Plan/ discharge barriers: possible dc in am.   Body mass index is 33.94 kg/m. Malnutrition Type:      Malnutrition Characteristics:      Nutrition Interventions:     RN Pressure Injury Documentation:     Consultants:     Procedures:     Antimicrobials:       Subjective: Patient continue to have abdominal pian, not yet back to baseline, associate with poor oral intake, no nausea or vomiting, no chest pain or dyspnea.   Objective: Vitals:   01/06/19 0620 01/06/19 1014 01/06/19 1113 01/06/19 1415  BP: 137/69  131/73 124/70  Pulse: 79  85 68  Resp: 16  16 19   Temp: 99.3 F (37.4 C) 99.5 F (37.5 C) 99.6 F (37.6 C) 99.3 F (37.4 C)  TempSrc: Oral Oral Oral Oral  SpO2: 93%  96% 95%  Weight:      Height:        Intake/Output Summary (Last 24 hours) at 01/06/2019 1458 Last data filed at 01/06/2019 1000 Gross per 24 hour  Intake 1846.21 ml  Output -  Net 1846.21 ml   Filed Weights   01/05/19 1820 01/05/19 2031 01/06/19 0105  Weight: 87.5 kg 87.5 kg 89.7 kg    Examination:   General: deconditioned  Neurology: Awake and alert, non focal  E ENT: mild pallor, no icterus, oral mucosa moist Cardiovascular: No JVD. S1-S2 present, rhythmic, no gallops, rubs, or murmurs. No lower extremity edema. Pulmonary: vesicular breath sounds bilaterally, adequate air movement, no wheezing,  rhonchi or rales. Gastrointestinal. Abdomen with no organomegaly, no rebound or guarding. Mild tender to palpation.  Skin. No rashes Musculoskeletal: no joint deformities     Data Reviewed: I have personally reviewed following labs and imaging studies  CBC: Recent Labs  Lab 01/05/19 1835 01/06/19 0208  WBC 16.2* 11.3*  NEUTROABS  --  8.2*  HGB 13.5 11.5*  HCT 42.7 36.2  MCV 90.9 92.6  PLT 289 232   Basic Metabolic Panel: Recent Labs  Lab 01/05/19 1835  NA 140   K 3.6  CL 107  CO2 22  GLUCOSE 109*  BUN 13  CREATININE 0.97  CALCIUM 10.1   GFR: Estimated Creatinine Clearance: 67.7 mL/min (by C-G formula based on SCr of 0.97 mg/dL). Liver Function Tests: Recent Labs  Lab 01/05/19 1835  AST 22  ALT 19  ALKPHOS 57  BILITOT 0.8  PROT 8.0  ALBUMIN 4.7   Recent Labs  Lab 01/05/19 1835  LIPASE 31   No results for input(s): AMMONIA in the last 168 hours. Coagulation Profile: No results for input(s): INR, PROTIME in the last 168 hours. Cardiac Enzymes: Recent Labs  Lab 01/06/19 0208  TROPONINI <0.03   BNP (last 3 results) No results for input(s): PROBNP in the last 8760 hours. HbA1C: No results for input(s): HGBA1C in the last 72 hours. CBG: No results for input(s): GLUCAP in the last 168 hours. Lipid Profile: No results for input(s): CHOL, HDL, LDLCALC, TRIG, CHOLHDL, LDLDIRECT in the last 72 hours. Thyroid Function Tests: No results for input(s): TSH, T4TOTAL, FREET4, T3FREE, THYROIDAB in the last 72 hours. Anemia Panel: No results for input(s): VITAMINB12, FOLATE, FERRITIN, TIBC, IRON, RETICCTPCT in the last 72 hours.    Radiology Studies: I have reviewed all of the imaging during this hospital visit personally     Scheduled Meds: . enoxaparin (LOVENOX) injection  40 mg Subcutaneous Q24H  . ezetimibe  10 mg Oral Daily  . hydrochlorothiazide  12.5 mg Oral Daily  . irbesartan  300 mg Oral Daily  . metoprolol tartrate  25 mg Oral Daily  . montelukast  10 mg Oral QHS  . pantoprazole (PROTONIX) IV  40 mg Intravenous Daily  . rosuvastatin  20 mg Oral Daily  . sodium chloride flush  3 mL Intravenous Once   Continuous Infusions: . sodium chloride Stopped (01/05/19 2359)  . cefTRIAXone (ROCEPHIN)  IV Stopped (01/05/19 2358)  . metronidazole 500 mg (01/06/19 1303)     LOS: 0 days        Brenda Cowher Annett Gula, MD

## 2019-01-06 NOTE — ED Notes (Signed)
[facility] arrived to transport pt to [facility] 

## 2019-01-06 NOTE — Consult Note (Signed)
Referring Provider: EDP Primary Care Physician:  Nestor RampNeal, Sara L, MD Primary Gastroenterologist:  Dr. Dulce Sellarutlaw   Reason for Consultation: Epigastric pain, fever  HPI: Miranda Baker is a 58 y.o. female with past medical history of coronary artery disease presented to the emergency room at [facility] with high-grade fever and worsening epigastric abdominal pain yesterday.  With history of chronic epigastric abdominal pain.  She underwent EGD and colonoscopy yesterday.  EGD showed hiatal hernia.  Colonoscopy was normal.  No biopsies were performed.  4-hour after procedure, patient started having significant epigastric pain and nausea.  Because of ongoing symptoms, she was seen in the emergency room where she was found to have elevated white count of 16.2 and fever of 101.1 F.  Further work-up showed normal LFTs and normal lipase.  CT abdomen pelvis with contrast showed no acute changes.  Chest x-ray negative.  GI is consulted for further evaluation.  She is also complaining of worsening headache and some facial erythema.  Denies diarrhea or constipation.  Denies blood in the stool or black stool.  Denies any vomiting.  Continues to have epigastric pain which is worse with oral intake.  Past Medical History:  Diagnosis Date  . Cellulitis   . Constipation   . Coronary artery disease    Stent 2.75x15 Xience 2011  . GERD (gastroesophageal reflux disease)   . Hyperlipidemia   . Hypertension   . Laboratory examination 09/24/2018  . MI (mitral incompetence)   . Paresthesia 09/24/2018    Past Surgical History:  Procedure Laterality Date  . ABDOMINAL HYSTERECTOMY  [facility]  . ESOPHAGOGASTRODUODENOSCOPY N/A 10/07/2012   Procedure: ESOPHAGOGASTRODUODENOSCOPY (EGD);  Surgeon: Willis ModenaWilliam Outlaw, MD;  Location[facility];  Service: Endoscopy;  Laterality: N/A;  . HERNIA REPAIR    . lap for adhesions    . LEFT HEART CATHETERIZATION WITH CORONARY ANGIOGRAM N/A 01/21/2012   Procedure: LEFT HEART CATHETERIZATION  WITH CORONARY ANGIOGRAM;  Surgeon: Pamella PertJagadeesh R Ganji, MD;  [facility];  Service: Cardiovascular;  Laterality: N/A;  . OOPHORECTOMY      Prior to Admission medications   Medication Sig Start Date End Date Taking? Authorizing Provider  ALPRAZolam (XANAX) 1 MG tablet TAKE ONE TABLET BY MOUTH UP TO 3 TIMES DAILY AS NEEDED FOR ANXIETY Patient taking differently: Take 1 mg by mouth at bedtime. TAKE ONE TABLET BY MOUTH UP TO 3 TIMES DAILY AS NEEDED FOR ANXIET 08/06/18  Yes Nestor RampNeal, Sara L, MD  cholecalciferol (VITAMIN D3) 25 MCG (1000 UT) tablet Take 1,000 Units by mouth daily.   Yes [provider]  citalopram (CELEXA) 40 MG tablet TAKE ONE TABLET BY MOUTH AT BEDTIME Patient taking differently: Take 40 mg by mouth daily.  11/10/17  Yes Nestor RampNeal, Sara L, MD  ezetimibe (ZETIA) 10 MG tablet TAKE ONE TABLET BY MOUTH DAILY Patient taking differently: Take 10 mg by mouth daily.  11/10/17  Yes Nestor RampNeal, Sara L, MD  hydrochlorothiazide (HYDRODIURIL) 25 MG tablet Take one half tab daily by mouth Patient taking differently: Take 12.5 mg by mouth daily.  06/01/18  Yes Nestor RampNeal, Sara L, MD  lansoprazole (PREVACID) 30 MG capsule Take 30 mg by mouth 2 (two) times daily before a meal.   Yes [provider]  methylphenidate (METADATE ER) 20 MG ER tablet TAKE ONE TABLET BY MOUTH EACH MORNING AND REPEAT ONCE AT NOON DAILY AS DIRECTED Patient taking differently: Take 20 mg by mouth See admin instructions. Take 1 tablet daily only on work days. 06/10/18  Yes [name], MD  metoprolol tartrate (LOPRESSOR) 25 MG tablet TAKE ONE TABLET BY MOUTH DAILY Patient taking differently: Take 25 mg by mouth daily.  10/20/18  Yes [name], MD  montelukast (SINGULAIR) 10 MG tablet Take 1 tablet (10 mg total) by mouth at bedtime. 03/06/18  Yes [name], MD  nitroGLYCERIN (NITROSTAT) 0.4 MG SL tablet Place 1 tablet (0.4 mg total) under the tongue every 5 (five) minutes x 3 doses as needed for chest pain. 07/31/15 02/12/26  Yes [name], MD  olmesartan (BENICAR) 40 MG tablet Take 1 tablet (40 mg total) by mouth daily. 06/01/18  Yes [name], MD  PATADAY 0.2 % SOLN INSTILL ONE DROP INTO THE AFFECTED EYE(S) ONCE DAILY AS DIRECTED Patient taking differently: 1 drop See admin instructions. In affected eye as needed for allergic conjuctvitis 10/10/16  Yes [name], MD  rosuvastatin (CRESTOR) 20 MG tablet Take 1 tablet (20 mg total) by mouth daily. 10/23/18 01/21/19 Yes Yates Decamp, MD  sucralfate (CARAFATE) 1 g tablet TAKE ONE TABLET BY MOUTH FOUR (4) TIMES DAILY WITH MEALS AND AT BEDTIME Patient taking differently: Take 1 g by mouth 2 (two) times daily as needed (flare ups).  02/25/17  Yes [name], MD  traMADol (ULTRAM) 50 MG tablet TAKE 1 TO 2 TABLETS BY MOUTH EVERY 8-12 HOURS AS NEEDED Patient taking differently: Take 50 mg by mouth at bedtime.  08/06/18  Yes [name], MD    Scheduled Meds: . enoxaparin (LOVENOX) injection  40 mg Subcutaneous Q24H  . ezetimibe  10 mg Oral Daily  . hydrochlorothiazide  12.5 mg Oral Daily  . irbesartan  300 mg Oral Daily  . metoprolol tartrate  25 mg Oral Daily  . montelukast  10 mg Oral QHS  . rosuvastatin  20 mg Oral Daily  . sodium chloride flush  3 mL Intravenous Once   Continuous Infusions: . sodium chloride Stopped (01/05/19 2359)  . cefTRIAXone (ROCEPHIN)  IV Stopped (01/05/19 2358)  . metronidazole 500 mg (01/06/19 0559)   PRN Meds:.sodium chloride, acetaminophen **OR** acetaminophen, ondansetron (ZOFRAN) IV  Allergies as of 01/05/2019 - Review Complete 01/05/2019  Allergen Reaction Noted  . Erythromycin Other (See Comments) 07/21/2009  . Morphine and related Other (See Comments) 01/17/2012  . Penicillins Other (See Comments) 07/21/2009  . Tetracyclines & related Other (See Comments) 01/17/2012  . Vibramycin [doxycycline calcium] Other (See Comments) 01/17/2012  . Bactrim [sulfamethoxazole-trimethoprim] Nausea Only 04/28/2015    No family history  on file.  Social History   Socioeconomic History  . Marital status: Married    Spouse name: Not on file  . Number of children: Not on file  . Years of education: Not on file  . Highest education level: Not on file  Occupational History  . Occupation: Diplomatic Services operational officer: [employer]  Social Needs  . Financial resource strain: Not on file  . Food insecurity:    Worry: Not on file    Inability: Not on file  . Transportation needs:    Medical: Not on file    Non-medical: Not on file  Tobacco Use  . Smoking status: Never Smoker  . Smokeless tobacco: Never Used  Substance and Sexual Activity  . Alcohol use: No  . Drug use: No  . Sexual activity: Not on file  Lifestyle  . Physical activity:    Days per week: Not on file    Minutes per session: Not  on file  . Stress: Not on file  Relationships  . Social connections:    Talks on phone: Not on file    Gets together: Not on file    Attends religious service: Not on file    Active member of club or organization: Not on file    Attends meetings of clubs or organizations: Not on file    Relationship status: Not on file  . Intimate partner violence:    Fear of current or ex partner: Not on file    Emotionally abused: Not on file    Physically abused: Not on file    Forced sexual activity: Not on file  Other Topics Concern  . Not on file  Social History Narrative  . Not on file    Review of Systems:Review of Systems  Constitutional: Positive for fever. Negative for weight loss.  HENT: Negative for hearing loss and tinnitus.   Eyes: Negative for blurred vision and double vision.  Respiratory: Negative for cough, hemoptysis and sputum production.   Cardiovascular: Negative for chest pain and palpitations.  Gastrointestinal: Positive for abdominal pain and nausea. Negative for blood in stool, constipation, diarrhea and vomiting.  Genitourinary: Negative for dysuria and urgency.  Musculoskeletal: Positive for  myalgias. Negative for neck pain.  Skin: Positive for rash.  Neurological: Negative for seizures and loss of consciousness.  Endo/Heme/Allergies: Does not bruise/bleed easily.  Psychiatric/Behavioral: Negative for hallucinations and substance abuse. The patient is nervous/anxious.     Physical Exam: Vital signs: Vitals:   01/06/19 1014 01/06/19 1113  BP:  131/73  Pulse:  85  Resp:  16  Temp: 99.5 F (37.5 C) 99.6 F (37.6 C)  SpO2:  96%   Last BM Date: 01/04/19 Physical Exam  Constitutional: She is oriented to person, place, and time. She appears well-developed and well-nourished. No distress.  HENT:  Head: Normocephalic and atraumatic.  Mouth/Throat: Oropharynx is clear and moist. No oropharyngeal exudate.  Eyes: EOM are normal. No scleral icterus.  Neck: Normal range of motion. Neck supple.  Cardiovascular: Normal rate, regular rhythm and normal heart sounds.  Pulmonary/Chest: Effort normal and breath sounds normal. No respiratory distress.  Abdominal: Soft. Bowel sounds are normal. She exhibits no distension. There is abdominal tenderness. There is no rebound and no guarding.  Epigastric tenderness to palpation noted  Musculoskeletal: Normal range of motion.        General: No edema.  Neurological: She is alert and oriented to person, place, and time.  Skin: Skin is warm. There is erythema.  Mild facial erythema noted.  No tenderness.  Vitals reviewed.   GI:  Lab Results: Recent Labs    01/05/19 1835 01/06/19 0208  WBC 16.2* 11.3*  HGB 13.5 11.5*  HCT 42.7 36.2  PLT 289 232   BMET Recent Labs    01/05/19 1835  NA 140  K 3.6  CL 107  CO2 22  GLUCOSE 109*  BUN 13  CREATININE 0.97  CALCIUM 10.1   LFT Recent Labs    01/05/19 1835  PROT 8.0  ALBUMIN 4.7  AST 22  ALT 19  ALKPHOS 57  BILITOT 0.8   PT/INR No results for input(s): LABPROT, INR in the last 72 hours.   Studies/Results: Ct Abdomen Pelvis W Contrast  Result Date:  01/05/2019 CLINICAL DATA:  Acute epigastric abdominal pain. EXAM: CT ABDOMEN AND PELVIS WITH CONTRAST TECHNIQUE: Multidetector CT imaging of the abdomen and pelvis was performed using the standard protocol following bolus administration of intravenous  contrast. CONTRAST:  OMNIPAQUE IOHEXOL 300 MG/ML  SOLN COMPARISON:  CT scan of February 12, 2018. FINDINGS: Lower chest: No acute abnormality. Hepatobiliary: No focal liver abnormality is seen. No gallstones, gallbladder wall thickening, or biliary dilatation. Pancreas: Unremarkable. No pancreatic ductal dilatation or surrounding inflammatory changes. Spleen: Normal in size without focal abnormality. Adrenals/Urinary Tract: Adrenal glands are unremarkable. Kidneys are normal, without renal calculi, focal lesion, or hydronephrosis. Bladder is unremarkable. Stomach/Bowel: Stomach is within normal limits. Appendix appears normal. No evidence of bowel wall thickening, distention, or inflammatory changes. Vascular/Lymphatic: Aortic atherosclerosis. No enlarged abdominal or pelvic lymph nodes. Reproductive: Status post hysterectomy. No adnexal masses. Other: No abdominal wall hernia or abnormality. No abdominopelvic ascites. Musculoskeletal: No acute or significant osseous findings. IMPRESSION: No acute abnormality seen in the abdomen or pelvis. Aortic Atherosclerosis (ICD10-I70.0). Electronically Signed   By: Lupita Raider M.D.   On: 01/05/2019 21:27   Dg Chest Port 1 View  Result Date: 01/05/2019 CLINICAL DATA:  Epigastric abdominal pain and fever for the past 3 hours. Nausea. EXAM: PORTABLE CHEST 1 VIEW COMPARISON:  08/29/2016. FINDINGS: Normal sized heart. Clear lungs with normal vascularity. Minimal right shoulder degenerative changes. IMPRESSION: No acute abnormality. Electronically Signed   By: Beckie Salts M.D.   On: 01/05/2019 21:21    Impression/Plan: -Fever and leukocytosis. ??  Drug reaction/drug fever.  ??  Atelectasis.  Work-up negative so far.  She  had EGD and colonoscopy yesterday. -Worsening epigastric abdominal pain.  Normal lactic acid.  Negative CT scan.  Recommendations --------------------------- -Leukocytosis is improving.  Continue antibiotics for 1 more day. -Start PPI (although EGD was negative yesterday) -Repeat chest x-ray in the morning.  Usually takes 72 to 96 hours for drug fever to get better. -GI will follow.  Hopefully discharge home tomorrow.   LOS: 0 days   Kathi Der  MD, FACP 01/06/2019, 12:12 PM  Contact #  [phone]

## 2019-01-06 NOTE — H&P (Signed)
History and Physical    PEARSON REASONS XJO:[id number] DOB: 1961/06/19 DOA: 01/05/2019  PCP: Dickie La, MD Patient coming from: [place] [facility]  Chief Complaint: Abdominal pain  HPI: Miranda Baker is a 58 y.o. female with medical history significant of CAD, hypertension, hyperlipidemia, GERD presenting as a transfer from [place] ED for management of abdominal pain and fever.  Patient states she has been having intermittent epigastric abdominal pain for the past few years for which she decided to seek medical attention.  She was seen by gastroenterology and underwent EGD and colonoscopy yesterday morning.  States she was told that they saw a hiatal hernia but no other abnormality.  Around lunchtime when she tried to take a sip of water, she experienced excruciating epigastric abdominal pain and nausea.  She did not vomit.  Her temperature was 102 F at home and so her daughter called the gastroenterology office who advised her to come to the hospital.  States in the ED when they tried giving her crackers to eat she started experiencing this pain again and felt very nauseous.  She has not been able to tolerate any p.o. intake.  At present, states her nausea has resolved and she is not having any abdominal pain.  Denies any chest pain or shortness of breath.  ED Course: Febrile with T-max 101.1 F.  Tachycardic on arrival, now improved.  Not hypotensive.  White count 16.2.  Lactic acid normal.  Lipase and LFTs normal.  ESR normal.  UA not suggestive of infection.  Blood culture x2 pending.  Urine culture pending.  COVID-19 rapid test negative.  Chest x-ray showing no acute abnormality.  CT abdomen pelvis showing no acute abnormality.  Tylenol, fentanyl, Zofran, ceftriaxone, Levaquin, and Flagyl given.  Review of Systems:  All systems reviewed and apart from history of presenting illness, are negative.  Past Medical History:  Diagnosis Date   Cellulitis    Constipation     Coronary artery disease    Stent 2.75x15 Xience 2011   GERD (gastroesophageal reflux disease)    Hyperlipidemia    Hypertension    Laboratory examination 09/24/2018   MI (mitral incompetence)    Paresthesia 09/24/2018    Past Surgical History:  Procedure Laterality Date   ABDOMINAL HYSTERECTOMY  [facility]   ESOPHAGOGASTRODUODENOSCOPY N/A 10/07/2012   Procedure: ESOPHAGOGASTRODUODENOSCOPY (EGD);  Surgeon: Arta Silence, MD;  Location: [facility];  Service: Endoscopy;  Laterality: N/A;   HERNIA REPAIR     lap for adhesions     LEFT HEART CATHETERIZATION WITH CORONARY ANGIOGRAM N/A 01/21/2012   Procedure: LEFT HEART CATHETERIZATION WITH CORONARY ANGIOGRAM;  Surgeon: Laverda Page, MD;  Location: [facility];  Service: Cardiovascular;  Laterality: N/A;   OOPHORECTOMY       reports that she has never smoked. She has never used smokeless tobacco. She reports that she does not drink alcohol or use drugs.  Allergies  Allergen Reactions   Penicillins Anaphylaxis   Morphine And Related Nausea And Vomiting and Other (See Comments)   Bactrim [Sulfamethoxazole-Trimethoprim] Nausea Only    Intolerance only   Erythromycin Nausea And Vomiting and Rash   Tetracyclines & Related Nausea And Vomiting and Rash   Vibramycin [Doxycycline Calcium] Nausea And Vomiting and Rash    No family history on file.  Prior to Admission medications   Medication Sig Start Date End Date Taking? Authorizing Provider  ALPRAZolam (XANAX) 1 MG tablet TAKE ONE TABLET BY MOUTH UP TO  3 TIMES DAILY AS NEEDED FOR ANXIETY 08/06/18  Yes Dickie La, MD  citalopram (CELEXA) 40 MG tablet TAKE ONE TABLET BY MOUTH AT BEDTIME 11/10/17  Yes Dickie La, MD  ezetimibe (ZETIA) 10 MG tablet TAKE ONE TABLET BY MOUTH DAILY 11/10/17  Yes Dickie La, MD  hydrochlorothiazide (HYDRODIURIL) 25 MG tablet Take one half tab daily by mouth 06/01/18  Yes Dickie La, MD  metoprolol tartrate (LOPRESSOR) 25 MG tablet TAKE ONE  TABLET BY MOUTH DAILY 10/20/18  Yes Dickie La, MD  montelukast (SINGULAIR) 10 MG tablet Take 1 tablet (10 mg total) by mouth at bedtime. 03/06/18  Yes Dickie La, MD  nitroGLYCERIN (NITROSTAT) 0.4 MG SL tablet Place 1 tablet (0.4 mg total) under the tongue every 5 (five) minutes x 3 doses as needed for chest pain. 07/31/15 02/12/26 Yes Dickie La, MD  olmesartan (BENICAR) 40 MG tablet Take 1 tablet (40 mg total) by mouth daily. 06/01/18  Yes Dickie La, MD  PATADAY 0.2 % SOLN INSTILL ONE DROP INTO THE AFFECTED EYE(S) ONCE DAILY AS DIRECTED 10/10/16  Yes Dickie La, MD  rosuvastatin (CRESTOR) 20 MG tablet Take 1 tablet (20 mg total) by mouth daily. 10/23/18 01/21/19 Yes Adrian Prows, MD  sucralfate (CARAFATE) 1 g tablet TAKE ONE TABLET BY MOUTH FOUR (4) TIMES DAILY WITH MEALS AND AT BEDTIME 02/25/17  Yes Dickie La, MD  traMADol (ULTRAM) 50 MG tablet TAKE 1 TO 2 TABLETS BY MOUTH EVERY 8-12 HOURS AS NEEDED 08/06/18  Yes Dickie La, MD  methylphenidate (METADATE ER) 20 MG ER tablet TAKE ONE TABLET BY MOUTH EACH MORNING AND REPEAT ONCE AT NOON DAILY AS DIRECTED 06/10/18   Dickie La, MD  pantoprazole (PROTONIX) 40 MG tablet TAKE ONE TABLET BY MOUTH TWICE DAILY 09/01/18   Dickie La, MD    Physical Exam: Vitals:   01/06/19 0000 01/06/19 0022 01/06/19 0105 01/06/19 0620  BP: 113/64  127/71 137/69  Pulse: 90  79 79  Resp: _0 Temp:  99.4 F (37.4 C) 99.5 F (37.5 C) 99.3 F (37.4 C)  TempSrc:  Oral Oral Oral  SpO2: 94%  96% 93%  Weight:   89.7 kg   Height:   _1  (1.626 m)     Physical Exam  Constitutional: She is oriented to person, place, and time. She appears well-developed and well-nourished. No distress.  Resting comfortably in a bed  HENT:  Head: Normocephalic.  Mouth/Throat: Oropharynx is clear and moist.  Mild facial flushing  Eyes: Right eye exhibits no discharge. Left eye exhibits no discharge.  Neck: Neck supple.  Cardiovascular: Normal rate, regular rhythm and  intact distal pulses.  Pulmonary/Chest: Effort normal. No respiratory distress. She has no wheezes. She has no rales.  Abdominal: Soft. Bowel sounds are normal. She exhibits no distension. There is abdominal tenderness. There is no rebound and no guarding.  Epigastrium mildly tender to palpation  Musculoskeletal:        General: No edema.  Neurological: She is alert and oriented to person, place, and time.  Skin: Skin is warm and dry. She is not diaphoretic.     Labs on Admission: I have personally reviewed following labs and imaging studies  CBC: Recent Labs  Lab 01/05/19 1835 01/06/19 0208  WBC 16.2* 11.3*  NEUTROABS  --  8.2*  HGB 13.5 11.5*  HCT 42.7 36.2  MCV 90.9 92.6  PLT 289 232  Basic Metabolic Panel: Recent Labs  Lab 01/05/19 1835  NA 140  K 3.6  CL 107  CO2 22  GLUCOSE 109*  BUN 13  CREATININE 0.97  CALCIUM 10.1   GFR: Estimated Creatinine Clearance: 67.7 mL/min (by C-G formula based on SCr of 0.97 mg/dL). Liver Function Tests: Recent Labs  Lab 01/05/19 1835  AST 22  ALT 19  ALKPHOS 57  BILITOT 0.8  PROT 8.0  ALBUMIN 4.7   Recent Labs  Lab 01/05/19 1835  LIPASE 31   No results for input(s): AMMONIA in the last 168 hours. Coagulation Profile: No results for input(s): INR, PROTIME in the last 168 hours. Cardiac Enzymes: Recent Labs  Lab 01/06/19 0208  TROPONINI <0.03   BNP (last 3 results) No results for input(s): PROBNP in the last 8760 hours. HbA1C: No results for input(s): HGBA1C in the last 72 hours. CBG: No results for input(s): GLUCAP in the last 168 hours. Lipid Profile: No results for input(s): CHOL, HDL, LDLCALC, TRIG, CHOLHDL, LDLDIRECT in the last 72 hours. Thyroid Function Tests: No results for input(s): TSH, T4TOTAL, FREET4, T3FREE, THYROIDAB in the last 72 hours. Anemia Panel: No results for input(s): VITAMINB12, FOLATE, FERRITIN, TIBC, IRON, RETICCTPCT in the last 72 hours. Urine analysis:    Component Value  Date/Time   COLORURINE YELLOW 01/05/2019 2031   APPEARANCEUR CLEAR 01/05/2019 2031   LABSPEC >1.030 (H) 01/05/2019 2031   PHURINE 5.5 01/05/2019 2031   GLUCOSEU NEGATIVE 01/05/2019 2031   HGBUR NEGATIVE 01/05/2019 2031   BILIRUBINUR NEGATIVE 01/05/2019 2031   KETONESUR 15 (A) 01/05/2019 2031   PROTEINUR NEGATIVE 01/05/2019 2031   UROBILINOGEN 0.2 01/17/2012 1522   NITRITE NEGATIVE 01/05/2019 2031   LEUKOCYTESUR NEGATIVE 01/05/2019 2031    Radiological Exams on Admission: Ct Abdomen Pelvis W Contrast  Result Date: 01/05/2019 CLINICAL DATA:  Acute epigastric abdominal pain. EXAM: CT ABDOMEN AND PELVIS WITH CONTRAST TECHNIQUE: Multidetector CT imaging of the abdomen and pelvis was performed using the standard protocol following bolus administration of intravenous contrast. CONTRAST:  160m OMNIPAQUE IOHEXOL 300 MG/ML  SOLN COMPARISON:  CT scan of February 12, 2018. FINDINGS: Lower chest: No acute abnormality. Hepatobiliary: No focal liver abnormality is seen. No gallstones, gallbladder wall thickening, or biliary dilatation. Pancreas: Unremarkable. No pancreatic ductal dilatation or surrounding inflammatory changes. Spleen: Normal in size without focal abnormality. Adrenals/Urinary Tract: Adrenal glands are unremarkable. Kidneys are normal, without renal calculi, focal lesion, or hydronephrosis. Bladder is unremarkable. Stomach/Bowel: Stomach is within normal limits. Appendix appears normal. No evidence of bowel wall thickening, distention, or inflammatory changes. Vascular/Lymphatic: Aortic atherosclerosis. No enlarged abdominal or pelvic lymph nodes. Reproductive: Status post hysterectomy. No adnexal masses. Other: No abdominal wall hernia or abnormality. No abdominopelvic ascites. Musculoskeletal: No acute or significant osseous findings. IMPRESSION: No acute abnormality seen in the abdomen or pelvis. Aortic Atherosclerosis (ICD10-I70.0). Electronically Signed   By: JMarijo ConceptionM.D.   On: 01/05/2019  21:27   Dg Chest Port 1 View  Result Date: 01/05/2019 CLINICAL DATA:  Epigastric abdominal pain and fever for the past 3 hours. Nausea. EXAM: PORTABLE CHEST 1 VIEW COMPARISON:  08/29/2016. FINDINGS: Normal sized heart. Clear lungs with normal vascularity. Minimal right shoulder degenerative changes. IMPRESSION: No acute abnormality. Electronically Signed   By: SClaudie ReveringM.D.   On: 01/05/2019 21:21    EKG: Independently reviewed.  Sinus rhythm, minimal ST depression and T wave inversions in inferior and lateral leads.  Prior EKG from February 2020 also showing diffuse T wave  abnormality, however, with artifact and difficult to read.  Assessment/Plan Principal Problem:   Fever Active Problems:   HTN (hypertension)   Hyperlipidemia   Epigastric abdominal pain   Nausea   CAD (coronary artery disease)  Fever, epigastric abdominal pain, nausea Symptoms started after EGD and colonoscopy yesterday. Febrile with T-max 101.1 F.  Tachycardic on arrival, now improved.  Not hypotensive.  White count 16.2.  Lactic acid normal.  Lipase and LFTs normal.  CT abdomen pelvis showing no acute abnormality.  Fever could possibly be due to a drug reaction.  Did receive sedation for these procedures.  Has mild facial flushing but otherwise appears nontoxic on exam. -ED physician discussed the case with [facility], will consult in a.m. -Will continue ceftriaxone and Flagyl at this time -Continue to monitor CBC -Tylenol PRN -IV Zofran PRN -Blood culture x2 pending  CAD status post PCI in 2012 EKG with minimal ST depression and T wave inversions in inferior and lateral leads.  Prior EKG from February 2020 also showing diffuse T wave abnormality, however, with artifact and difficult to read.  Troponin negative.  Patient appears comfortable on exam and denies having any chest pain.  Last cardiology office visit with cardiology on September 24, 2018.  Currently not on aspirin. -Continue home beta-blocker,  ARB -Repeat EKG in a.m.  Hypertension -Continue home beta-blocker, ARB, thiazide diuretic  Hyperlipidemia -Continue home Zetia, Crestor  DVT prophylaxis: Lovenox Code Status: Full code Family Communication: No family available. Disposition Plan: Anticipate discharge in 1 to 2 days. Consults called: [facility] (Dr. Oletta Lamas) Admission status: It is my clinical opinion that referral for OBSERVATION is reasonable and necessary in this patient based on the above information provided. The aforementioned taken together are felt to place the patient at high risk for further clinical deterioration. However it is anticipated that the patient may be medically stable for discharge from the hospital within 24 to 48 hours.  The medical decision making on this patient was of high complexity and the patient is at high risk for clinical deterioration, therefore this is a level 3 visit.  Shela Leff MD [facility] Pager [phone]  If 7PM-7AM, please contact night-coverage [email or web address] Password [facility]  01/06/2019, 7:41 AM

## 2019-01-07 ENCOUNTER — Inpatient Hospital Stay (HOSPITAL_COMMUNITY): Payer: BC Managed Care – PPO

## 2019-01-07 DIAGNOSIS — I2510 Atherosclerotic heart disease of native coronary artery without angina pectoris: Secondary | ICD-10-CM

## 2019-01-07 DIAGNOSIS — R110 Nausea: Secondary | ICD-10-CM

## 2019-01-07 LAB — BASIC METABOLIC PANEL
Anion gap: 8 (ref 5–15)
BUN: 10 mg/dL (ref 6–20)
CO2: 25 mmol/L (ref 22–32)
Calcium: 9.5 mg/dL (ref 8.9–10.3)
Chloride: 109 mmol/L (ref 98–111)
Creatinine, Ser: 0.97 mg/dL (ref 0.44–1.00)
GFR calc Af Amer: >60 mL/min (ref >60)
GFR calc non Af Amer: >60 mL/min (ref >60)
Glucose, Bld: 101 mg/dL — ABNORMAL HIGH (ref 70–99)
Potassium: 3.8 mmol/L (ref 3.5–5.1)
Sodium: 142 mmol/L (ref 135–145)

## 2019-01-07 LAB — CBC WITH DIFFERENTIAL/PLATELET
Abs Immature Granulocytes: 0.01 10*3/uL (ref 0.00–0.07)
Basophils Absolute: 0.1 10*3/uL (ref 0.0–0.1)
Basophils Relative: 1 %
Eosinophils Absolute: 0.3 10*3/uL (ref 0.0–0.5)
Eosinophils Relative: 5 %
HCT: 36.3 % (ref 36.0–46.0)
Hemoglobin: 11.5 g/dL — ABNORMAL LOW (ref 12.0–15.0)
Immature Granulocytes: 0 %
Lymphocytes Relative: 37 %
Lymphs Abs: 2.4 10*3/uL (ref 0.7–4.0)
MCH: 29.6 pg (ref 26.0–34.0)
MCHC: 31.7 g/dL (ref 30.0–36.0)
MCV: 93.3 fL (ref 80.0–100.0)
Monocytes Absolute: 0.6 10*3/uL (ref 0.1–1.0)
Monocytes Relative: 9 %
Neutro Abs: 3.2 10*3/uL (ref 1.7–7.7)
Neutrophils Relative %: 48 %
Platelets: 219 10*3/uL (ref 150–400)
RBC: 3.89 MIL/uL (ref 3.87–5.11)
RDW: 12.7 % (ref 11.5–15.5)
WBC: 6.5 10*3/uL (ref 4.0–10.5)
nRBC: 0 % (ref 0.0–0.2)

## 2019-01-07 MED ORDER — TRAMADOL HCL 50 MG PO TABS
50.0000 mg | ORAL_TABLET | Freq: Four times a day (QID) | ORAL | Status: DC | PRN
  Administered 2019-01-07: 50 mg via ORAL
  Filled 2019-01-07: qty 1

## 2019-01-07 MED ORDER — ONDANSETRON HCL 4 MG PO TABS: 4.0000 mg | ORAL_TABLET | Freq: Three times a day (TID) | ORAL | Status: DC | PRN

## 2019-01-07 MED ORDER — ONDANSETRON HCL 4 MG PO TABS: 4.0000 mg | ORAL_TABLET | Freq: Three times a day (TID) | ORAL | 0 refills | Status: DC | PRN

## 2019-01-07 NOTE — Progress Notes (Signed)
[facility] Progress Note  Miranda Baker 58 y.o. 01-Apr-1961  CC: Fever, epigastric abdominal pain   Subjective: She is feeling better today.  Mild discomfort in epigastric area with oral intake.  Had bowel movement today.  Denies nausea vomiting.  Denies blood in the stool.    Objective: Vital signs in last 24 hours: Vitals:   01/07/19 0610 01/07/19 0615  BP: 132/79 132/79  Pulse: 64 64  Resp: 16 16  Temp: 99 F (37.2 C) 99 F (37.2 C)  SpO2: 100% 100%    Physical Exam:  GEN  Alert/oriented x3.  Not in acute distress Abdomen.  Soft, nontender, nondistended, bowel sounds present.  Lab Results: Recent Labs    01/05/19 1835 01/07/19 0639  NA 140 142  K 3.6 3.8  CL 107 109  CO2 22 25  GLUCOSE 109* 101*  BUN 13 10  CREATININE 0.97 0.97  CALCIUM 10.1 9.5   Recent Labs    01/05/19 1835  AST 22  ALT 19  ALKPHOS 57  BILITOT 0.8  PROT 8.0  ALBUMIN 4.7   Recent Labs    01/06/19 0208 01/07/19 0639  WBC 11.3* 6.5  NEUTROABS 8.2* 3.2  HGB 11.5* 11.5*  HCT 36.2 36.3  MCV 92.6 93.3  PLT 232 219   No results for input(s): LABPROT, INR in the last 72 hours.    Assessment/Plan: -Fever and leukocytosis. ??  Drug reaction/drug fever.    Resolved. -Worsening epigastric abdominal pain.  Normal lactic acid.  Negative CT scan.    Recommendations --------------------------- -Leukocytosis resolved.  Afebrile.  Epigastric pain improving. -Okay to discharge from GI standpoint.  Continue PPI.  Follow-up with primary GI Dr. Dulce Baker in 2 to 4 weeks.    Kathi Der MD, FACP 01/07/2019, 10:59 AM  Contact #  [phone]

## 2019-01-07 NOTE — Discharge Summary (Signed)
Physician Discharge Summary  Miranda Baker JXB:[id number] DOB: 29-Apr-1961 DOA: 01/05/2019  PCP: [name], MD  Admit date: 01/05/2019 Discharge date: 01/07/2019  Admitted From: Home  Disposition:  Home   Recommendations for Outpatient Follow-up and new medication changes:  1. Follow up with Dr. Jennette Kettle in 7 days.  2. Added as needed ondansetron.   Home Health: no   Equipment/Devices: no    Discharge Condition: stable  CODE STATUS: full  Diet recommendation: heart healthy   Brief/Interim Summary: 58 year old female who presented with epigastric pain and fever.  She does have significant past medical history for coronary artery  disease, dyslipidemia and GERD.  Reported intermittent epigastric abdominal pain for the last few years, the day before admission she underwent upper endoscopy and colonoscopy, she was found to have a hiatal hernia.  Post procedure she experienced excruciating epigastric pain after trying a sip of water, associated with abdominal pain, nausea, and fever, a temperature 102 F.  On initial physical examination her temperature was 101.1 F, blood pressure 113/64, heart rate 90, respiratory rate 15, oxygen saturation 94%.  She had mild face facial flushing, lungs were clear to auscultation bilaterally, heart S1-S2 present rhythmic, her abdomen was tender to palpation.  Her CT of the abdomen showed no acute abnormalities. Chest radiograph with no infiltrates.  EKG 102 bpm, normal axis, normal intervals, sinus rhythm, no ST segment changes, T wave inversion in lead II, III and aVF, V1 through V4  Patient was admitted to the hospital working diagnosis of febrile syndrome status post endoscopic procedure.   1.  Febrile syndrome.  She does admitted to the medical ward, she was placed in a remote telemetry monitor, received IV fluids, started on empiric IV antibiotic therapy with ceftriaxone.  Her SARS COVID-19 was negative.  Patient remained afebrile during hospitalization, no  leukocytosis, her cultures were no growth, antibiotics were discontinued.  2.  Hypertension.  Continue hydrochlorothiazide metoprolol and olmesartan for blood pressure control.   3.  Coronary artery disease.  Patient remained chest pain-free. Continue nitroglycerin and rosuvastatin.   4.  Anxiety.  Continue alprazolam.  5.  Dyspepsia, hiatal hernia.  Patient will continue antiacid therapy, with sucralfate and lansoprazole.     Discharge Diagnoses:  Principal Problem:   Fever Active Problems:   HTN (hypertension)   Hyperlipidemia   Epigastric abdominal pain   Nausea   CAD (coronary artery disease)    Discharge Instructions   Allergies as of 01/07/2019      Reactions   Penicillins Anaphylaxis   Morphine And Related Nausea And Vomiting, Other (See Comments)   Bactrim [sulfamethoxazole-trimethoprim] Nausea Only   Intolerance only   Erythromycin Nausea And Vomiting, Rash   Tetracyclines & Related Nausea And Vomiting, Rash   Vibramycin [doxycycline Calcium] Nausea And Vomiting, Rash      Medication List    TAKE these medications   ALPRAZolam 1 MG tablet Commonly known as:  XANAX TAKE ONE TABLET BY MOUTH UP TO 3 TIMES DAILY AS NEEDED FOR ANXIETY What changed:    how much to take  how to take this  when to take this  additional instructions   cholecalciferol 25 MCG (1000 UT) tablet Commonly known as:  VITAMIN D3 Take 1,000 Units by mouth daily.   citalopram 40 MG tablet Commonly known as:  CELEXA TAKE ONE TABLET BY MOUTH AT BEDTIME What changed:  when to take this   ezetimibe 10 MG tablet Commonly known as:  ZETIA TAKE ONE TABLET BY  MOUTH DAILY   hydrochlorothiazide 25 MG tablet Commonly known as:  HYDRODIURIL Take one half tab daily by mouth What changed:    how much to take  how to take this  when to take this  additional instructions   lansoprazole 30 MG capsule Commonly known as:  PREVACID Take 30 mg by mouth 2 (two) times daily before a  meal.   methylphenidate 20 MG ER tablet Commonly known as:  Metadate ER TAKE ONE TABLET BY MOUTH EACH MORNING AND REPEAT ONCE AT NOON DAILY AS DIRECTED What changed:    how much to take  how to take this  when to take this  additional instructions   metoprolol tartrate 25 MG tablet Commonly known as:  LOPRESSOR TAKE ONE TABLET BY MOUTH DAILY   montelukast 10 MG tablet Commonly known as:  SINGULAIR Take 1 tablet (10 mg total) by mouth at bedtime.   nitroGLYCERIN 0.4 MG SL tablet Commonly known as:  NITROSTAT Place 1 tablet (0.4 mg total) under the tongue every 5 (five) minutes x 3 doses as needed for chest pain.   olmesartan 40 MG tablet Commonly known as:  BENICAR Take 1 tablet (40 mg total) by mouth daily.   ondansetron 4 MG tablet Commonly known as:  ZOFRAN Take 1 tablet (4 mg total) by mouth every 8 (eight) hours as needed for nausea or vomiting.   Pataday 0.2 % Soln Generic drug:  Olopatadine HCl INSTILL ONE DROP INTO THE AFFECTED EYE(S) ONCE DAILY AS DIRECTED What changed:  See the new instructions.   rosuvastatin 20 MG tablet Commonly known as:  CRESTOR Take 1 tablet (20 mg total) by mouth daily.   sucralfate 1 g tablet Commonly known as:  CARAFATE TAKE ONE TABLET BY MOUTH FOUR (4) TIMES DAILY WITH MEALS AND AT BEDTIME What changed:  See the new instructions.   traMADol 50 MG tablet Commonly known as:  ULTRAM TAKE 1 TO 2 TABLETS BY MOUTH EVERY 8-12 HOURS AS NEEDED What changed:  See the new instructions.       Allergies  Allergen Reactions  . Penicillins Anaphylaxis  . Morphine And Related Nausea And Vomiting and Other (See Comments)  . Bactrim [Sulfamethoxazole-Trimethoprim] Nausea Only    Intolerance only  . Erythromycin Nausea And Vomiting and Rash  . Tetracyclines & Related Nausea And Vomiting and Rash  . Vibramycin [Doxycycline Calcium] Nausea And Vomiting and Rash    Consultations:  GI    Procedures/Studies: Dg Chest 2  View  Result Date: 01/07/2019 CLINICAL DATA:  Fever. EXAM: CHEST - 2 VIEW COMPARISON:  Radiograph of January 05, 2019. FINDINGS: The heart size and mediastinal contours are within normal limits. Both lungs are clear. The visualized skeletal structures are unremarkable. IMPRESSION: No active cardiopulmonary disease. Electronically Signed   By: Lupita Raider M.D.   On: 01/07/2019 08:43   Ct Abdomen Pelvis W Contrast  Result Date: 01/05/2019 CLINICAL DATA:  Acute epigastric abdominal pain. EXAM: CT ABDOMEN AND PELVIS WITH CONTRAST TECHNIQUE: Multidetector CT imaging of the abdomen and pelvis was performed using the standard protocol following bolus administration of intravenous contrast. CONTRAST:  OMNIPAQUE IOHEXOL 300 MG/ML  SOLN COMPARISON:  CT scan of February 12, 2018. FINDINGS: Lower chest: No acute abnormality. Hepatobiliary: No focal liver abnormality is seen. No gallstones, gallbladder wall thickening, or biliary dilatation. Pancreas: Unremarkable. No pancreatic ductal dilatation or surrounding inflammatory changes. Spleen: Normal in size without focal abnormality. Adrenals/Urinary Tract: Adrenal glands are unremarkable. Kidneys are normal, without  renal calculi, focal lesion, or hydronephrosis. Bladder is unremarkable. Stomach/Bowel: Stomach is within normal limits. Appendix appears normal. No evidence of bowel wall thickening, distention, or inflammatory changes. Vascular/Lymphatic: Aortic atherosclerosis. No enlarged abdominal or pelvic lymph nodes. Reproductive: Status post hysterectomy. No adnexal masses. Other: No abdominal wall hernia or abnormality. No abdominopelvic ascites. Musculoskeletal: No acute or significant osseous findings. IMPRESSION: No acute abnormality seen in the abdomen or pelvis. Aortic Atherosclerosis (ICD10-I70.0). Electronically Signed   By: Lupita Raider M.D.   On: 01/05/2019 21:27   Dg Chest Port 1 View  Result Date: 01/05/2019 CLINICAL DATA:  Epigastric abdominal pain  and fever for the past 3 hours. Nausea. EXAM: PORTABLE CHEST 1 VIEW COMPARISON:  08/29/2016. FINDINGS: Normal sized heart. Clear lungs with normal vascularity. Minimal right shoulder degenerative changes. IMPRESSION: No acute abnormality. Electronically Signed   By: Beckie Salts M.D.   On: 01/05/2019 21:21      Procedures:   Subjective: Patient is feeling better, tolerating po well, improved nausea and no vomiting. No chest pain.   Discharge Exam: Vitals:   01/07/19 0610 01/07/19 0615  BP: 132/79 132/79  Pulse: 64 64  Resp: 16 16  Temp: 99 F (37.2 C) 99 F (37.2 C)  SpO2: 100% 100%   Vitals:   01/06/19 2227 01/07/19 0139 01/07/19 0610 01/07/19 0615  BP: (!) 127/59 130/72 132/79 132/79  Pulse: 66 67 64 64  Resp: Temp: 99.3 F (37.4 C) 99.3 F (37.4 C) 99 F (37.2 C) 99 F (37.2 C)  TempSrc: Oral Oral Oral Oral  SpO2: 96% 98% 100% 100%  Weight:      Height:        General: Not in pain or dyspnea.  Neurology: Awake and alert, non focal  E ENT: no pallor, no icterus, oral mucosa moist Cardiovascular: No JVD. S1-S2 present, rhythmic, no gallops, rubs, or murmurs. No lower extremity edema. Pulmonary: vesicular breath sounds bilaterally, adequate air movement, no wheezing, rhonchi or rales. Gastrointestinal. Abdomen with, no organomegaly, non tender, no rebound or guarding Skin. No rashes Musculoskeletal: no joint deformities   The results of significant diagnostics from this hospitalization (including imaging, microbiology, ancillary and laboratory) are listed below for reference.     Microbiology: Recent Results (from the past 240 hour(s))  Blood Culture (routine x 2)     Status: None (Preliminary result)   Collection Time: 01/05/19  6:35 PM  Result Value Ref Range Status   Specimen Description   Final    BLOOD RIGHT ANTECUBITAL Performed at [facility], [address] [place], [place] [address]    Special Requests   Final    BOTTLES  DRAWN AEROBIC AND ANAEROBIC Blood Culture adequate volume Performed at [facility], [address] [place], [place] [address]    Culture   Final    NO GROWTH 2 DAYS Performed at [facility], [address], [place], [place] [address]    Report Status PENDING  Incomplete  Blood Culture (routine x 2)     Status: None (Preliminary result)   Collection Time: 01/05/19  8:31 PM  Result Value Ref Range Status   Specimen Description   Final    BLOOD LEFT ANTECUBITAL Performed at [facility], [address] [place] [address] [place], [place] [address]    Special Requests   Final    BOTTLES DRAWN AEROBIC AND ANAEROBIC Blood Culture adequate volume Performed at [facility]  [address], [address] [place], [place] [address]    Culture   Final    NO GROWTH 2 DAYS Performed at [facility], [address] [place], [place] [address]    Report Status PENDING  Incomplete  SARS Coronavirus 2 (Hosp order,Performed in [facility] lab via Abbott ID)     Status: None   Collection Time: 01/05/19  8:31 PM  Result Value Ref Range Status   SARS Coronavirus 2 (Abbott ID Now) NEGATIVE NEGATIVE Final    Comment: (NOTE) Interpretive Result Comment(s): COVID 19 Positive SARS CoV 2 target nucleic acids are DETECTED. The SARS CoV 2 RNA is generally detectable in upper and lower respiratory specimens during the acute phase of infection.  Positive results are indicative of active infection with SARS CoV 2.  Clinical correlation with patient history and other diagnostic information is necessary to determine patient infection status.  Positive results do not rule out bacterial infection or coinfection with other viruses. The expected result is Negative. COVID 19 Negative SARS CoV 2 target nucleic acids are NOT DETECTED. The SARS CoV 2 RNA is generally detectable in upper and lower respiratory specimens during the acute phase of infection.  Negative results do not preclude SARS CoV 2  infection, do not rule out coinfections with other pathogens, and should not be used as the sole basis for treatment or other patient management decisions.  Negative results must be combined with clinical  observations, patient history, and epidemiological information. The expected result is Negative. Invalid Presence or absence of SARS CoV 2 nucleic acids cannot be determined. Repeat testing was performed on the submitted specimen and repeated Invalid results were obtained.  If clinically indicated, additional testing on a new specimen with an alternate test methodology [phone] is advised.  The SARS CoV 2 RNA is generally detectable in upper and lower respiratory specimens during the acute phase of infection. The expected result is Negative. Fact Sheet for Patients:  http://www.graves-ford.org/https://www.fda.gov/media/136524/download Fact Sheet for Healthcare Providers: EnviroConcern.sihttps://www.fda.gov/media/136523/download This test is not yet approved or cleared by the [place] FDA and has been authorized for detection and/or diagnosis of SARS CoV 2 by FDA under an Emergency Use Authorization (EUA).  This EUA will remain in effect (meaning this test can be used) for the duration of the COVID19 d eclaration under Section 564(b)(1) of the Act, 21 U.S.C. section [phone] 3(b)(1), unless the authorization is terminated or revoked sooner. Performed at [facility] [place], [address] [place], [place] [address]   Urine culture     Status: Abnormal   Collection Time: 01/05/19  8:31 PM  Result Value Ref Range Status   Specimen Description   Final    URINE, RANDOM Performed at [facility] [place], [address] [place], [place] [address]    Special Requests   Final    NONE Performed at [facility] [place], [address] [place], [place] [address]    Culture MULTIPLE SPECIES PRESENT, SUGGEST RECOLLECTION (A)  Final   Report Status 01/06/2019 FINAL  Final     Labs: BNP (last 3 results) No  results for input(s): BNP in the last 8760 hours. Basic Metabolic Panel: Recent Labs  Lab 01/05/19 1835 01/07/19 0639  NA 140 142  K 3.6 3.8  CL 107 109  CO2 22 25  GLUCOSE 109* 101*  BUN 13 10  CREATININE 0.97 0.97  CALCIUM 10.1 9.5   Liver Function Tests: Recent Labs  Lab 01/05/19 1835  AST 22  ALT 19  ALKPHOS 57  BILITOT 0.8  PROT 8.0  ALBUMIN 4.7   Recent Labs  Lab 01/05/19 1835  LIPASE 31   No results for input(s): AMMONIA in the last 168 hours. CBC: Recent Labs  Lab 01/05/19 1835 01/06/19 0208 01/07/19 0639  WBC 16.2* 11.3* 6.5  NEUTROABS  --  8.2* 3.2  HGB 13.5 11.5* 11.5*  HCT 42.7 36.2 36.3  MCV 90.9 92.6 93.3  PLT 289 232 219   Cardiac Enzymes: Recent Labs  Lab 01/06/19 0208  TROPONINI <0.03   BNP: Invalid input(s): POCBNP CBG: No results for input(s): GLUCAP in the last 168 hours. D-Dimer No results for input(s): DDIMER in the last 72 hours. Hgb A1c No results for input(s): HGBA1C in the last 72 hours. Lipid Profile No results for input(s): CHOL, HDL, LDLCALC, TRIG, CHOLHDL, LDLDIRECT in the last 72 hours. Thyroid function studies No results for input(s): TSH, T4TOTAL, T3FREE, THYROIDAB in the last 72 hours.  Invalid input(s): FREET3 Anemia work up No results for input(s): VITAMINB12, FOLATE, FERRITIN, TIBC, IRON, RETICCTPCT in the last 72 hours. Urinalysis    Component Value Date/Time   COLORURINE YELLOW 01/05/2019 2031   APPEARANCEUR CLEAR 01/05/2019 2031   LABSPEC >1.030 (H) 01/05/2019 2031   PHURINE 5.5 01/05/2019 2031   GLUCOSEU NEGATIVE 01/05/2019 2031   HGBUR NEGATIVE 01/05/2019 2031   BILIRUBINUR NEGATIVE 01/05/2019 2031   KETONESUR 15 (A) 01/05/2019 2031   PROTEINUR NEGATIVE 01/05/2019 2031   UROBILINOGEN 0.2 01/17/2012 1522   NITRITE NEGATIVE 01/05/2019 2031   LEUKOCYTESUR NEGATIVE 01/05/2019 2031   Sepsis Labs Invalid input(s): PROCALCITONIN,  WBC,  LACTICIDVEN Microbiology Recent Results (from the past 240  hour(s))  Blood Culture (routine x 2)     Status: None (Preliminary result)   Collection Time: 01/05/19  6:35 PM  Result Value Ref Range Status   Specimen Description   Final    BLOOD RIGHT ANTECUBITAL Performed at [facility] [address] [place], [place] [address]    Special Requests   Final    BOTTLES DRAWN AEROBIC AND ANAEROBIC Blood Culture adequate volume Performed at [facility], [address] [place], [place] [address]    Culture   Final    NO GROWTH 2 DAYS Performed at [facility], [address] [place], [place] [address]    Report Status PENDING  Incomplete  Blood Culture (routine x 2)     Status: None (Preliminary result)   Collection Time: 01/05/19  8:31 PM  Result Value Ref Range Status   Specimen Description   Final    BLOOD LEFT ANTECUBITAL Performed at [facility], [address] [place], [place] [address]    Special Requests   Final    BOTTLES DRAWN AEROBIC AND ANAEROBIC Blood Culture adequate volume Performed at [facility], [address] [place], [place] [address]    Culture   Final    NO GROWTH 2 DAYS Performed at [facility], [address], [place], [place] [address]    Report Status PENDING  Incomplete  SARS Coronavirus 2 (Hosp order,Performed in [facility] lab via Abbott ID)     Status: None   Collection Time: 01/05/19  8:31 PM  Result Value Ref Range Status   SARS Coronavirus 2 (Abbott ID Now) NEGATIVE NEGATIVE Final    Comment: (NOTE) Interpretive Result Comment(s): COVID 19 Positive SARS  CoV 2 target nucleic acids are DETECTED. The SARS CoV 2 RNA is generally detectable in upper and lower respiratory specimens during the acute phase of infection.  Positive results are indicative of active infection with SARS CoV 2.  Clinical correlation with patient history and other diagnostic information is necessary to determine patient infection status.  Positive results do not rule  out bacterial infection or coinfection with other viruses. The expected result is Negative. COVID 19 Negative SARS CoV 2 target nucleic acids are NOT DETECTED. The SARS CoV 2 RNA is generally detectable in upper and lower respiratory specimens during the acute phase of infection.  Negative results do not preclude SARS CoV 2 infection, do not rule out coinfections with other pathogens, and should not be used as the sole basis for treatment or other patient management decisions.  Negative results must be combined with clinical  observations, patient history, and epidemiological information. The expected result is Negative. Invalid Presence or absence of SARS CoV 2 nucleic acids cannot be determined. Repeat testing was performed on the submitted specimen and repeated Invalid results were obtained.  If clinically indicated, additional testing on a new specimen with an alternate test methodology [id number] is advised.  The SARS CoV 2 RNA is generally detectable in upper and lower respiratory specimens during the acute phase of infection. The expected result is Negative. Fact Sheet for Patients:  http://www.graves-ford.org/ Fact Sheet for Healthcare Providers: EnviroConcern.si This test is not yet approved or cleared by the [place] FDA and has been authorized for detection and/or diagnosis of SARS CoV 2 by FDA under an Emergency Use Authorization (EUA).  This EUA will remain in effect (meaning this test can be used) for the duration of the COVID19 d eclaration under Section 564(b)(1) of the Act, 21 U.S.C. section [phone] 3(b)(1), unless the authorization is terminated or revoked sooner. Performed at [facility], [address] [place], [place] [address]   Urine culture     Status: Abnormal   Collection Time: 01/05/19  8:31 PM  Result Value Ref Range Status   Specimen Description   Final    URINE, RANDOM Performed at [facility], [address] [place], [place] [address]    Special Requests   Final    NONE Performed at [facility], [address] [place], [place] [address]    Culture MULTIPLE SPECIES PRESENT, SUGGEST RECOLLECTION (A)  Final   Report Status 01/06/2019 FINAL  Final     Time coordinating discharge: 45 minutes  SIGNED:   Coralie Keens, MD  [facility] 01/07/2019, 10:10 AM

## 2019-01-07 NOTE — Progress Notes (Signed)
All discharge instructions including medications discussed with patient. All personal belongings including cell phone gathered. Patient had no questions. Husband arrived to pick her up and take her home.

## 2019-01-10 LAB — CULTURE, BLOOD (ROUTINE X 2)
Culture: NO GROWTH
Culture: NO GROWTH
Special Requests: ADEQUATE
Special Requests: ADEQUATE

## 2019-01-11 ENCOUNTER — Encounter: Payer: Self-pay | Admitting: Family Medicine

## 2019-01-11 MED FILL — ONDANSETRON HCL 4 MG TABLET: 4 | 7 days supply | Qty: 20 | Fill #0

## 2019-01-12 ENCOUNTER — Encounter: Payer: Self-pay | Admitting: Family Medicine

## 2019-01-13 ENCOUNTER — Ambulatory Visit: Payer: BC Managed Care – PPO | Admitting: Family Medicine

## 2019-01-13 ENCOUNTER — Other Ambulatory Visit: Payer: Self-pay

## 2019-01-13 DIAGNOSIS — R1013 Epigastric pain: Secondary | ICD-10-CM

## 2019-01-13 MED ORDER — LUBIPROSTONE 8 MCG PO CAPS: 8.0000 ug | ORAL_CAPSULE | Freq: Two times a day (BID) | ORAL | 1 refills | Status: DC

## 2019-01-13 MED ORDER — BISACODYL 10 MG RE SUPP: RECTAL | 3 refills | Status: DC

## 2019-01-14 LAB — COMPREHENSIVE METABOLIC PANEL
ALT: 37 IU/L — ABNORMAL HIGH (ref 0–32)
AST: 24 IU/L (ref 0–40)
Albumin/Globulin Ratio: 1.8 (ref 1.2–2.2)
Albumin: 4.4 g/dL (ref 3.8–4.9)
Alkaline Phosphatase: 60 IU/L (ref 39–117)
BUN/Creatinine Ratio: 14 (ref 9–23)
BUN: 19 mg/dL (ref 6–24)
Bilirubin Total: 0.3 mg/dL (ref 0.0–1.2)
CO2: 22 mmol/L (ref 20–29)
Calcium: 10 mg/dL (ref 8.7–10.2)
Chloride: 104 mmol/L (ref 96–106)
Creatinine, Ser: 1.36 mg/dL — ABNORMAL HIGH (ref 0.57–1.00)
GFR calc Af Amer: 49 mL/min/{1.73_m2} — ABNORMAL LOW (ref >59)
GFR calc non Af Amer: 43 mL/min/{1.73_m2} — ABNORMAL LOW (ref >59)
Globulin, Total: 2.4 g/dL (ref 1.5–4.5)
Glucose: 98 mg/dL (ref 65–99)
Potassium: 4 mmol/L (ref 3.5–5.2)
Sodium: 141 mmol/L (ref 134–144)
Total Protein: 6.8 g/dL (ref 6.0–8.5)

## 2019-01-14 LAB — CBC
Hematocrit: 36.2 % (ref 34.0–46.6)
Hemoglobin: 12.2 g/dL (ref 11.1–15.9)
MCH: 28.8 pg (ref 26.6–33.0)
MCHC: 33.7 g/dL (ref 31.5–35.7)
MCV: 86 fL (ref 79–97)
Platelets: 274 10*3/uL (ref 150–450)
RBC: 4.23 x10E6/uL (ref 3.77–5.28)
RDW: 12.5 % (ref 11.7–15.4)
WBC: 7.6 10*3/uL (ref 3.4–10.8)

## 2019-01-25 ENCOUNTER — Other Ambulatory Visit: Payer: Self-pay | Admitting: Cardiology

## 2019-01-25 ENCOUNTER — Other Ambulatory Visit: Payer: Self-pay | Admitting: Family Medicine

## 2019-01-25 DIAGNOSIS — E7800 Pure hypercholesterolemia, unspecified: Secondary | ICD-10-CM

## 2019-01-25 MED FILL — ROSUVASTATIN CALCIUM 20 MG: 20 | 90 days supply | Qty: 90 | Fill #0

## 2019-01-25 NOTE — Telephone Encounter (Signed)
Please fill

## 2019-01-25 NOTE — Progress Notes (Signed)
    CHIEF COMPLAINT / HPI:  Abdominal pain.  Has been going on for weeks.  She saw the GI doc he did a colonoscopy and an EGD negative.  She does have hiatal hernia.  He started her on [place].  She still has not had a bowel movement in 5 days.  She has been taking fiber and prune juice as well. REVIEW OF SYSTEMS: No fever.  No unusual weight loss.  PERTINENT  PMH / PSH: I have reviewed the patient's medications, allergies, past medical and surgical history, smoking status and updated in the EMR as appropriate.   OBJECTIVE:  Vital signs reviewed. GENERAL: Well-developed, well-nourished, no acute distress. CARDIOVASCULAR: Regular rate and rhythm no murmur gallop or rub LUNGS: Clear to auscultation bilaterally, no rales or wheeze. ABDOMEN: Soft positive bowel sounds NEURO: No gross focal neurological deficits. MSK: Movement of extremity x 4.    ASSESSMENT / PLAN:  No problem-specific Assessment & Plan notes found for this encounter.

## 2019-01-25 NOTE — Assessment & Plan Note (Signed)
I reviewed her recent procedures including colonoscopy and EGD.  I reviewed Dr. Erlinda Hong notes.  I will increase her amities a to twice a day.  She will get back with me and let me know how she is doing.  She has follow-up with him in about 3 weeks.

## 2019-01-26 ENCOUNTER — Telehealth: Payer: Self-pay | Admitting: *Deleted

## 2019-01-26 ENCOUNTER — Telehealth: Payer: Self-pay | Admitting: Family Medicine

## 2019-01-26 NOTE — Telephone Encounter (Signed)
Patient tells me the increased dose of Amitiza needs a prior Auth. She has called her pharmacy TWICE and they said they would fax it. I do not have it--can you call them and see what the issue is? THANKS! Dorcas Mcmurray

## 2019-01-26 NOTE — Telephone Encounter (Signed)
Received fax from pharmacy, PA needed on [place].  Clinical questions submitted via Cover My Meds.  Waiting on response, could take up to 72 hours.  Cover My Meds info: Key: Anselmo Pickler, CMA.

## 2019-01-28 DIAGNOSIS — K5900 Constipation, unspecified: Secondary | ICD-10-CM | POA: Diagnosis not present

## 2019-01-28 DIAGNOSIS — R109 Unspecified abdominal pain: Secondary | ICD-10-CM | POA: Diagnosis not present

## 2019-01-28 NOTE — Telephone Encounter (Signed)
   Miranda Baker, CMA

## 2019-01-29 ENCOUNTER — Telehealth: Payer: Self-pay

## 2019-01-29 MED ORDER — TRULANCE 3 MG PO TABS: 1.0000 | ORAL_TABLET | Freq: Every day | ORAL | 2 refills | Status: DC

## 2019-01-29 NOTE — Telephone Encounter (Signed)
Received fax request for prior authorization from pharmacy for Trulance 3mg  tablets. Completed PA via cover my meds and the medication has been approved from 01/29/2019 through 01/28/2020.  The pharmacy has been notified.

## 2019-02-16 ENCOUNTER — Other Ambulatory Visit: Payer: Self-pay

## 2019-02-17 MED ORDER — EZETIMIBE 10 MG PO TABS: 10.0000 mg | ORAL_TABLET | Freq: Every day | ORAL | 0 refills | Status: DC

## 2019-03-30 ENCOUNTER — Other Ambulatory Visit: Payer: Self-pay | Admitting: Family Medicine

## 2019-03-30 MED ORDER — ALPRAZOLAM 1 MG PO TABS: ORAL_TABLET | ORAL | 3 refills | Status: DC

## 2019-03-30 MED ORDER — TRAMADOL HCL 50 MG PO TABS: ORAL_TABLET | ORAL | 4 refills | Status: DC

## 2019-04-20 DIAGNOSIS — K5900 Constipation, unspecified: Secondary | ICD-10-CM | POA: Diagnosis not present

## 2019-04-20 DIAGNOSIS — R109 Unspecified abdominal pain: Secondary | ICD-10-CM | POA: Diagnosis not present

## 2019-05-01 ENCOUNTER — Other Ambulatory Visit: Payer: Self-pay | Admitting: Family Medicine

## 2019-05-03 MED FILL — ROSUVASTATIN CALCIUM 20 MG: 20 | 90 days supply | Qty: 90 | Fill #1

## 2019-05-11 ENCOUNTER — Other Ambulatory Visit: Payer: Self-pay | Admitting: Family Medicine

## 2019-05-21 DIAGNOSIS — R1013 Epigastric pain: Secondary | ICD-10-CM | POA: Diagnosis not present

## 2019-05-21 DIAGNOSIS — K5900 Constipation, unspecified: Secondary | ICD-10-CM | POA: Diagnosis not present

## 2019-06-08 ENCOUNTER — Other Ambulatory Visit: Payer: Self-pay

## 2019-06-09 MED ORDER — CITALOPRAM HYDROBROMIDE 40 MG PO TABS: 40.0000 mg | ORAL_TABLET | Freq: Every day | ORAL | 3 refills | Status: DC

## 2019-06-14 ENCOUNTER — Other Ambulatory Visit: Payer: Self-pay

## 2019-06-14 MED ORDER — EZETIMIBE 10 MG PO TABS: 10.0000 mg | ORAL_TABLET | Freq: Every day | ORAL | 3 refills | Status: DC

## 2019-06-21 ENCOUNTER — Other Ambulatory Visit: Payer: Self-pay

## 2019-06-21 ENCOUNTER — Ambulatory Visit: Payer: 59 | Admitting: Family Medicine

## 2019-06-21 DIAGNOSIS — M542 Cervicalgia: Secondary | ICD-10-CM

## 2019-06-21 MED ORDER — PREDNISONE 10 MG PO TABS: ORAL_TABLET | ORAL | 1 refills | Status: DC

## 2019-06-21 MED ORDER — BACLOFEN 10 MG PO TABS: 10.0000 mg | ORAL_TABLET | Freq: Every evening | ORAL | 1 refills | Status: DC | PRN

## 2019-06-21 MED ORDER — METHYLPHENIDATE HCL ER 20 MG PO TBCR: EXTENDED_RELEASE_TABLET | ORAL | 0 refills | Status: DC

## 2019-06-25 NOTE — Progress Notes (Signed)
    CHIEF COMPLAINT / HPI:  Neck pain Posterior and L>R. Many weeks. Aching. Sometimes associated trapezius pain. Lots of stressors.  REVIEW OF SYSTEMS: No dizziness  PERTINENT  PMH / PSH: I have reviewed the patient's medications, allergies, past medical and surgical history, smoking status and updated in the EMR as appropriate.   OBJECTIVE: Vital signs reviewed. GENERAL: Well-developed, well-nourished, no acute distress. CARDIOVASCULAR: Regular rate and rhythm no murmur gallop or rub LUNGS: Clear to auscultation bilaterally, no rales or wheeze. ABDOMEN: Soft positive bowel sounds NEURO: No gross focal neurological deficits. MSK: Movement of extremity x 4.    ASSESSMENT / PLAN:   MSK neck pain No red flags HEP and muscle relaxers Recommended massage F/u if not improving or new or worsening sx.

## 2019-07-07 ENCOUNTER — Other Ambulatory Visit: Payer: Self-pay

## 2019-07-07 ENCOUNTER — Encounter: Payer: Self-pay | Admitting: Family Medicine

## 2019-07-07 ENCOUNTER — Ambulatory Visit: Payer: 59 | Admitting: Family Medicine

## 2019-07-07 VITALS — BP 104/72 | HR 60 | Wt 194.0 lb

## 2019-07-07 DIAGNOSIS — I2510 Atherosclerotic heart disease of native coronary artery without angina pectoris: Secondary | ICD-10-CM

## 2019-07-07 DIAGNOSIS — I10 Essential (primary) hypertension: Secondary | ICD-10-CM | POA: Diagnosis not present

## 2019-07-07 DIAGNOSIS — E7800 Pure hypercholesterolemia, unspecified: Secondary | ICD-10-CM | POA: Diagnosis not present

## 2019-07-07 DIAGNOSIS — G2581 Restless legs syndrome: Secondary | ICD-10-CM | POA: Diagnosis not present

## 2019-07-07 MED ORDER — TRAMADOL HCL 50 MG PO TABS: ORAL_TABLET | ORAL | 4 refills | Status: DC

## 2019-07-07 MED ORDER — EZETIMIBE 10 MG PO TABS: 10.0000 mg | ORAL_TABLET | Freq: Every day | ORAL | 3 refills | Status: DC

## 2019-07-07 NOTE — Progress Notes (Signed)
    CHIEF COMPLAINT / HPI: #1.  Follow-up recent problems with neck and upper back pain.  Significantly improved.  She did get a massage.  She said the muscle actions also help.  She is trying to be more intentional at work about stretching out her muscles and has changed her chair so she has a better position in front of her computer. 2.  Needs some refills and would like to go ahead and get her yearly lab work.  She continues on her medicine for hyperlipidemia without problem. #3.  Having symptoms of restless legs at night.  She is also have occasionally had the symptoms in her upper extremities.  Tramadol seems to work for her but she is having to get up at night for 5 times a week and take a tramadol because her symptoms have increased to that frequency.  REVIEW OF SYSTEMS: No other unusual arthralgias, no lower extremity edema, no chest pain, no shortness of breath, denies fever, no cough.  PERTINENT  PMH / PSH: I have reviewed the patient's medications, allergies, past medical and surgical history, smoking status and updated in the EMR as appropriate.   OBJECTIVE:  Vital signs reviewed. GENERAL: Well-developed, well-nourished, no acute distress. CARDIOVASCULAR: Regular rate and rhythm no murmur gallop or rub LUNGS: Clear to auscultation bilaterally, no rales or wheeze. ABDOMEN: Soft positive bowel sounds NEURO: No gross focal neurological deficits. MSK: Movement of extremity x 4. PSYCH: AxOx4. Good eye contact.. No psychomotor retardation or agitation. Appropriate speech fluency and content. Asks and answers questions appropriately. Mood is congruent.     ASSESSMENT / PLAN:   HTN (hypertension) Good control.  Continue current medications and will check labs today.  Restless legs Slightly atypical symptoms as she also has some pain associated with this particularly when it bothers her in the upper extremities.  Given that her tramadol works for this, I would just have her take  that prophylactically 1 at bedtime for right now.  Follow-up if this is not improving or if she has worsening symptoms.  Hyperlipidemia Continue on Lipitor plus Zetia mild.  I refilled that.

## 2019-07-08 DIAGNOSIS — G2581 Restless legs syndrome: Secondary | ICD-10-CM | POA: Insufficient documentation

## 2019-07-08 LAB — LIPID PANEL
Chol/HDL Ratio: 2.5 ratio (ref 0.0–4.4)
Cholesterol, Total: 142 mg/dL (ref 100–199)
HDL: 57 mg/dL (ref >39)
LDL Chol Calc (NIH): 59 mg/dL (ref 0–99)
Triglycerides: 151 mg/dL — ABNORMAL HIGH (ref 0–149)
VLDL Cholesterol Cal: 26 mg/dL (ref 5–40)

## 2019-07-08 LAB — CMP14+EGFR
ALT: 13 IU/L (ref 0–32)
AST: 19 IU/L (ref 0–40)
Albumin/Globulin Ratio: 1.7 (ref 1.2–2.2)
Albumin: 4.5 g/dL (ref 3.8–4.9)
Alkaline Phosphatase: 66 IU/L (ref 39–117)
BUN/Creatinine Ratio: 12 (ref 12–28)
BUN: 16 mg/dL (ref 8–27)
Bilirubin Total: 0.5 mg/dL (ref 0.0–1.2)
CO2: 22 mmol/L (ref 20–29)
Calcium: 10.6 mg/dL — ABNORMAL HIGH (ref 8.7–10.3)
Chloride: 106 mmol/L (ref 96–106)
Creatinine, Ser: 1.31 mg/dL — ABNORMAL HIGH (ref 0.57–1.00)
GFR calc Af Amer: 51 mL/min/{1.73_m2} — ABNORMAL LOW (ref >59)
GFR calc non Af Amer: 44 mL/min/{1.73_m2} — ABNORMAL LOW (ref >59)
Globulin, Total: 2.6 g/dL (ref 1.5–4.5)
Glucose: 96 mg/dL (ref 65–99)
Potassium: 4.8 mmol/L (ref 3.5–5.2)
Sodium: 144 mmol/L (ref 134–144)
Total Protein: 7.1 g/dL (ref 6.0–8.5)

## 2019-07-08 NOTE — Assessment & Plan Note (Signed)
Slightly atypical symptoms as she also has some pain associated with this particularly when it bothers her in the upper extremities.  Given that her tramadol works for this, I would just have her take that prophylactically 1 at bedtime for right now.  Follow-up if this is not improving or if she has worsening symptoms.

## 2019-07-08 NOTE — Assessment & Plan Note (Signed)
Continue on Lipitor plus Zetia mild.  I refilled that.

## 2019-07-08 NOTE — Assessment & Plan Note (Signed)
Good control.  Continue current medications and will check labs today.

## 2019-08-01 ENCOUNTER — Encounter: Payer: Self-pay | Admitting: Family Medicine

## 2019-08-13 ENCOUNTER — Other Ambulatory Visit: Payer: Self-pay

## 2019-08-13 MED ORDER — CITALOPRAM HYDROBROMIDE 40 MG PO TABS: 40.0000 mg | ORAL_TABLET | Freq: Every day | ORAL | 3 refills | Status: DC

## 2019-08-13 MED FILL — CITALOPRAM HBR 40 MG TABLET: 40 | 30 days supply | Qty: 30 | Fill #0

## 2019-08-25 ENCOUNTER — Encounter: Payer: Self-pay | Admitting: Family Medicine

## 2019-08-26 ENCOUNTER — Other Ambulatory Visit: Payer: Self-pay | Admitting: Family Medicine

## 2019-08-26 MED ORDER — METHYLPHENIDATE HCL ER 20 MG PO TBCR: EXTENDED_RELEASE_TABLET | ORAL | 0 refills | Status: DC

## 2019-08-30 ENCOUNTER — Other Ambulatory Visit: Payer: Self-pay | Admitting: *Deleted

## 2019-08-30 MED ORDER — OLMESARTAN MEDOXOMIL 40 MG PO TABS: 40.0000 mg | ORAL_TABLET | Freq: Every day | ORAL | 3 refills | Status: DC

## 2019-09-17 ENCOUNTER — Ambulatory Visit (INDEPENDENT_AMBULATORY_CARE_PROVIDER_SITE_OTHER): Payer: 59 | Admitting: Family Medicine

## 2019-09-17 ENCOUNTER — Other Ambulatory Visit: Payer: Self-pay

## 2019-09-17 VITALS — BP 120/76 | Ht 64.0 in | Wt 189.0 lb

## 2019-09-17 DIAGNOSIS — F4310 Post-traumatic stress disorder, unspecified: Secondary | ICD-10-CM | POA: Diagnosis not present

## 2019-09-17 DIAGNOSIS — M7502 Adhesive capsulitis of left shoulder: Secondary | ICD-10-CM | POA: Diagnosis not present

## 2019-09-17 MED ORDER — HYDROXYZINE PAMOATE 100 MG PO CAPS: 100.0000 mg | ORAL_CAPSULE | Freq: Every day | ORAL | 3 refills | Status: DC

## 2019-09-17 MED ORDER — METHYLPREDNISOLONE ACETATE 40 MG/ML IJ SUSP
40.0000 mg | Freq: Once | INTRAMUSCULAR | Status: AC
  Administered 2019-09-17: 12:00:00 40 mg via INTRA_ARTICULAR

## 2019-09-17 NOTE — Assessment & Plan Note (Signed)
Subacromial bursa injection today.  We will see if that helps her jumpstart her home exercise plan.  If not when I see her back in 3 to 4 weeks we will consider glenohumeral joint injection.

## 2019-09-17 NOTE — Progress Notes (Signed)
  Miranda Baker - 59 y.o. female MRN [id number]  Date of birth: 1960-10-07    SUBJECTIVE:      Chief Complaint:/ HPI:  Left shoulder pain.  She is having pain with abduction above shoulder height.  Sometimes if she flexes forward and has a turning motion she has similar pain in her shoulder.  Also having difficulty sleeping on that at night.  Notes that this all started after her flu shot back in October and has gotten progressively worse.  No hand numbness.  Right-hand-dominant. 2.  Has started cognitive behavioral therapy.  Been diagnosed with PTSD.  Therapist wonders if we could add something at night for anxiety.  She wakes typically at 4 AM with panic symptoms.      OBJECTIVE: BP 120/76   Ht 5\' 4"  (1.626 m)   Wt 189 lb (85.7 kg)   BMI 32.44 kg/m   Physical Exam:  Vital signs are reviewed. SHOULDERS: Bilateral are symmetrical.  She has intact strength in all planes of the rotator cuff bilaterally.  She has pain with abduction above shoulder height, pain with forward flexion above shoulder height with resisted motion.  There is no tenderness to the bicep tendon.  There is no defect noted in the bicep or deltoid muscle.  She is neurovascularly intact distally.  PROCEDURE: INJECTION: Patient was given informed consent, signed copy in the chart. Appropriate time out was taken. Area prepped and draped in usual sterile fashion. Ethyl chloride was  used for local anesthesia. A 21 gauge 1 1/2 inch needle was used.. 1 cc of methylprednisolone 40 mg/ml plus 4 cc of 1% lidocaine without epinephrine was injected into the left subacromial bursa of the shoulder using a(n) posterior approach.   The patient tolerated the procedure well. There were no complications. Post procedure instructions were given.   ASSESSMENT & PLAN:  See problem based charting & AVS for pt instructions. No problem-specific Assessment & Plan notes found for this encounter.

## 2019-09-17 NOTE — Assessment & Plan Note (Signed)
I applauded her efforts to get into cognitive behavioral therapy.  Her anxiety symptoms in the middle of the night are quite severe.  We will start with hydroxyzine.  I would have a low threshold to consider something like Seroquel.  Would avoid doxepin or trazodone secondary to her issues with constipation.  We will follow-up in 3 weeks.  Continue CBT

## 2019-09-27 ENCOUNTER — Other Ambulatory Visit: Payer: Self-pay | Admitting: *Deleted

## 2019-09-27 ENCOUNTER — Telehealth: Payer: Self-pay

## 2019-09-27 MED FILL — CITALOPRAM HBR 40 MG TABLET: 40 | 30 days supply | Qty: 30 | Fill #1

## 2019-09-27 NOTE — Telephone Encounter (Signed)
I see that she has had labs in Dec and I do not need any new ones

## 2019-09-27 NOTE — Telephone Encounter (Signed)
Patient called requesting if labs can be done before her annual appointment on 10/14/2019. Please Advise

## 2019-09-28 MED ORDER — HYDROCHLOROTHIAZIDE 25 MG PO TABS: ORAL_TABLET | ORAL | 3 refills | Status: DC

## 2019-09-29 ENCOUNTER — Ambulatory Visit: Payer: 59 | Admitting: Cardiology

## 2019-10-01 NOTE — Telephone Encounter (Signed)
Patient aware.

## 2019-10-06 ENCOUNTER — Ambulatory Visit: Payer: 59 | Admitting: Family Medicine

## 2019-10-14 ENCOUNTER — Ambulatory Visit: Payer: 59 | Admitting: Cardiology

## 2019-10-21 MED FILL — CITALOPRAM HBR 40 MG TABLET: 40 | 30 days supply | Qty: 30 | Fill #2

## 2019-11-03 ENCOUNTER — Other Ambulatory Visit: Payer: Self-pay | Admitting: Family Medicine

## 2019-11-06 ENCOUNTER — Ambulatory Visit: Payer: Self-pay | Attending: Internal Medicine

## 2019-11-06 DIAGNOSIS — Z23 Encounter for immunization: Secondary | ICD-10-CM

## 2019-11-06 NOTE — Progress Notes (Signed)
   Covid-19 Vaccination Clinic  Name:  SYBEL STANDISH    MRN: [id number] DOB: 04-13-1961  11/06/2019  Ms. Verley was observed post Covid-19 immunization for 30 minutes based on pre-vaccination screening without incident. She was provided with Vaccine Information Sheet and instruction to access the V-Safe system.   Ms. Renfrow was instructed to call 911 with any severe reactions post vaccine: Marland Kitchen Difficulty breathing  . Swelling of face and throat  . A fast heartbeat  . A bad rash all over body  . Dizziness and weakness   Immunizations Administered    Name Date Dose VIS Date Route   Pfizer COVID-19 Vaccine 11/06/2019 10:40 AM 0.3 mL 07/16/2019 Intramuscular   Manufacturer: [employer], [employer]   Lot: [id number]   NDC: [address]-7

## 2019-11-08 ENCOUNTER — Other Ambulatory Visit: Payer: Self-pay | Admitting: Family Medicine

## 2019-11-16 ENCOUNTER — Telehealth: Payer: Self-pay

## 2019-11-16 ENCOUNTER — Encounter: Payer: Self-pay | Admitting: Family Medicine

## 2019-11-16 NOTE — Telephone Encounter (Signed)
Medication approved via covermymeds. The pharmacy has been notified.

## 2019-11-16 NOTE — Telephone Encounter (Signed)
Received fax from pharmacy, PA needed on Tramadol. Clinical questions submitted via Cover My Meds. Waiting on response, could take up to 72 hours.  Cover My Meds info: Key: [id number]

## 2019-11-19 ENCOUNTER — Encounter (HOSPITAL_BASED_OUTPATIENT_CLINIC_OR_DEPARTMENT_OTHER): Payer: Self-pay | Admitting: Orthopaedic Surgery

## 2019-11-19 ENCOUNTER — Other Ambulatory Visit: Payer: Self-pay

## 2019-11-19 NOTE — Progress Notes (Signed)
Chart reviewed by Dr. Lovell Sheehan and will proceed with surgery as scheduled, no further testing or clearance needed.

## 2019-11-22 ENCOUNTER — Other Ambulatory Visit (HOSPITAL_COMMUNITY)
Admission: RE | Admit: 2019-11-22 | Discharge: 2019-11-22 | Disposition: A | Discharge status: Home / Self Care | Payer: 59 | Source: Ambulatory Visit | Attending: Orthopaedic Surgery | Admitting: Orthopaedic Surgery

## 2019-11-22 ENCOUNTER — Encounter (HOSPITAL_BASED_OUTPATIENT_CLINIC_OR_DEPARTMENT_OTHER)
Admission: RE | Admit: 2019-11-22 | Discharge: 2019-11-22 | Disposition: A | Discharge status: Home / Self Care | Payer: 59 | Source: Ambulatory Visit | Attending: Orthopaedic Surgery | Admitting: Orthopaedic Surgery

## 2019-11-22 DIAGNOSIS — Z01812 Encounter for preprocedural laboratory examination: Secondary | ICD-10-CM | POA: Insufficient documentation

## 2019-11-22 DIAGNOSIS — Z20822 Contact with and (suspected) exposure to covid-19: Secondary | ICD-10-CM | POA: Insufficient documentation

## 2019-11-22 LAB — BASIC METABOLIC PANEL
Anion gap: 8 (ref 5–15)
BUN: 18 mg/dL (ref 6–20)
CO2: 27 mmol/L (ref 22–32)
Calcium: 10.3 mg/dL (ref 8.9–10.3)
Chloride: 103 mmol/L (ref 98–111)
Creatinine, Ser: 0.93 mg/dL (ref 0.44–1.00)
GFR calc Af Amer: >60 mL/min (ref >60)
GFR calc non Af Amer: >60 mL/min (ref >60)
Glucose, Bld: 97 mg/dL (ref 70–99)
Potassium: 4.9 mmol/L (ref 3.5–5.1)
Sodium: 138 mmol/L (ref 135–145)

## 2019-11-22 LAB — SARS CORONAVIRUS 2 (TAT 6-24 HRS): SARS Coronavirus 2: NEGATIVE

## 2019-11-23 NOTE — Progress Notes (Signed)

## 2019-11-24 NOTE — H&P (Signed)
PREOPERATIVE H&P  Chief Complaint: LEFT SHOULDER ADHESIVE CAPSULITIS  HPI: Miranda Baker is a 59 y.o. female who is scheduled for SHOULDER ARTHROSCOPY WITH DEBRIDEMENT EXTENSIVE WITH LYSIS OF ADHESIONS CLOSED MANIPULATION SHOULDER.   Patient has a past medical history significant for CAD with stent in 2010, HTN, hyperlipidemia, and GERD.   Patient who had an injection in her left upper arm about six months ago.  Afterward, she had trouble with pain and limited range of motion.  She has not made any progress with injections, physical therapy, and home exercises.   Her symptoms are rated as moderate to severe, and have been worsening.  This is significantly impairing activities of daily living.    Please see clinic note for further details on this patient's care.    She has elected for surgical management.   Past Medical History:  Diagnosis Date  . Anxiety   . Cellulitis   . Complication of anesthesia   . Constipation   . Coronary artery disease    Stent 2.75x15 Xience 2011  . Depression   . GERD (gastroesophageal reflux disease)   . Hyperlipidemia   . Hypertension   . Laboratory examination 09/24/2018  . MI (mitral incompetence)   . Paresthesia 09/24/2018   Past Surgical History:  Procedure Laterality Date  . ABDOMINAL HYSTERECTOMY  [facility]  . COLONOSCOPY WITH ESOPHAGOGASTRODUODENOSCOPY (EGD)     01/05/2019  . ESOPHAGOGASTRODUODENOSCOPY N/A 10/07/2012   Procedure: ESOPHAGOGASTRODUODENOSCOPY (EGD);  Surgeon: Willis Modena, MD;  Location: [facility];  Service: Endoscopy;  Laterality: N/A;  . HERNIA REPAIR    . lap for adhesions    . LEFT HEART CATHETERIZATION WITH CORONARY ANGIOGRAM N/A 01/21/2012   Procedure: LEFT HEART CATHETERIZATION WITH CORONARY ANGIOGRAM;  Surgeon: Pamella Pert, MD;  Location: [facility] CATH LAB;  Service: Cardiovascular;  Laterality: N/A;  . OOPHORECTOMY     Social History   Socioeconomic History  . Marital status: Married    Spouse name: Not on  file  . Number of children: Not on file  . Years of education: Not on file  . Highest education level: Not on file  Occupational History  . Occupation: Diplomatic Services operational officer: [employer]  Tobacco Use  . Smoking status: Never Smoker  . Smokeless tobacco: Never Used  Substance and Sexual Activity  . Alcohol use: No  . Drug use: No  . Sexual activity: Not on file  Other Topics Concern  . Not on file  Social History Narrative  . Not on file   Social Determinants of Health   Financial Resource Strain:   . Difficulty of Paying Living Expenses:   Food Insecurity:   . Worried About Programme researcher, broadcasting/film/video in the Last Year:   . Barista in the Last Year:   Transportation Needs:   . Freight forwarder (Medical):   Marland Kitchen Lack of Transportation (Non-Medical):   Physical Activity:   . Days of Exercise per Week:   . Minutes of Exercise per Session:   Stress:   . Feeling of Stress :   Social Connections:   . Frequency of Communication with Friends and Family:   . Frequency of Social Gatherings with Friends and Family:   . Attends Religious Services:   . Active Member of Clubs or Organizations:   . Attends Banker Meetings:   Marland Kitchen Marital Status:    History reviewed. No pertinent family history. Allergies  Allergen Reactions  . Penicillins  Anaphylaxis  . Morphine And Related Nausea And Vomiting and Other (See Comments)  . Propofol     Cardiac dysrhthmias after EGD  . Bactrim [Sulfamethoxazole-Trimethoprim] Nausea Only    Intolerance only  . Erythromycin Nausea And Vomiting and Rash  . Tetracyclines & Related Nausea And Vomiting and Rash  . Vibramycin [Doxycycline Calcium] Nausea And Vomiting and Rash   Prior to Admission medications   Medication Sig Start Date End Date Taking? Authorizing Provider  ALPRAZolam Duanne Moron) 1 MG tablet TAKE ONE TABLET BY MOUTH UP TO 3 TIMES DAILY AS NEEDED FOR ANXIETY 11/09/19  Yes Dickie La, MD  citalopram (CELEXA) 40 MG  tablet Take 1 tablet (40 mg total) by mouth daily. 08/13/19  Yes Dickie La, MD  ezetimibe (ZETIA) 10 MG tablet Take 1 tablet (10 mg total) by mouth daily. 07/07/19  Yes Dickie La, MD  hydrochlorothiazide (HYDRODIURIL) 25 MG tablet Take one half tab daily by mouth 09/28/19  Yes Dickie La, MD  methylphenidate (METADATE ER) 20 MG ER tablet TAKE ONE TABLET BY MOUTH EACH MORNING AND REPEAT ONCE AT NOON DAILY AS DIRECTED 08/26/19  Yes Dickie La, MD  metoprolol tartrate (LOPRESSOR) 25 MG tablet TAKE 1 TABLET BY MOUTH EVERY DAY 11/03/19  Yes Dickie La, MD  montelukast (SINGULAIR) 10 MG tablet Take 1 tablet (10 mg total) by mouth at bedtime. 03/06/18  Yes Dickie La, MD  olmesartan (BENICAR) 40 MG tablet Take 1 tablet (40 mg total) by mouth daily. 08/30/19  Yes Dickie La, MD  Plecanatide (TRULANCE) 3 MG TABS Take 1 tablet by mouth daily. 01/29/19  Yes Dickie La, MD  traMADol Veatrice Bourbon) 50 MG tablet Take one to two tablets every 8 hours prn neck pain and restless legs 07/07/19  Yes Dickie La, MD  cholecalciferol (VITAMIN D3) 25 MCG (1000 UT) tablet Take 1,000 Units by mouth daily.    [provider]  lansoprazole (PREVACID) 30 MG capsule Take 30 mg by mouth 2 (two) times daily before a meal.    [provider]  nitroGLYCERIN (NITROSTAT) 0.4 MG SL tablet Place 1 tablet (0.4 mg total) under the tongue every 5 (five) minutes x 3 doses as needed for chest pain. 07/31/15 02/12/26  Dickie La, MD  ondansetron (ZOFRAN) 4 MG tablet Take 1 tablet (4 mg total) by mouth every 8 (eight) hours as needed for nausea or vomiting. 01/07/19   Arrien, Jimmy Picket, MD  PATADAY 0.2 % SOLN INSTILL ONE DROP INTO THE AFFECTED EYE(S) ONCE DAILY AS DIRECTED Patient taking differently: 1 drop See admin instructions. In affected eye as needed for allergic conjuctvitis 10/10/16   Dickie La, MD  rosuvastatin (CRESTOR) 20 MG tablet TAKE 1 TABLET BY MOUTH DAILY. 01/25/19   Adrian Prows, MD    ROS: All other  systems have been reviewed and were otherwise negative with the exception of those mentioned in the HPI and as above.  Physical Exam: General: Alert, no acute distress Cardiovascular: No pedal edema Respiratory: No cyanosis, no use of accessory musculature GI: No organomegaly, abdomen is soft and non-tender Skin: No lesions in the area of chief complaint Neurologic: Sensation intact distally Psychiatric: Patient is competent for consent with normal mood and affect Lymphatic: No axillary or cervical lymphadenopathy  MUSCULOSKELETAL:  Left shoulder: active forward elevation to about 80, passive to about 110, limited by pain.  External rotation to 60 versus 90.  Cuff strength is 5-/5 supraspinatus.  Remainder  is intact.  Unable to really assess O'Brien's test.    Imaging: X-rays of the shoulder demonstrate no acute osseous abnormalities of the glenohumeral joint.  Mild AC arthrosis.    MRI shows possible supraspinatus tear, partial thickness insertional tear of infraspinatus, findings can also be seen in setting of adhesive capsulitis  Assessment: LEFT SHOULDER ADHESIVE CAPSULITIS, possible rotator cuff tear  Plan: Plan for Procedure(s): SHOULDER ARTHROSCOPY WITH DEBRIDEMENT EXTENSIVE WITH LYSIS OF ADHESIONS CLOSED MANIPULATION SHOULDER  The risks benefits and alternatives were discussed with the patient including but not limited to the risks of nonoperative treatment, versus surgical intervention including infection, bleeding, nerve injury,  blood clots, cardiopulmonary complications, morbidity, mortality, among others, and they were willing to proceed.   The patient acknowledged the explanation, agreed to proceed with the plan and consent was signed.   Operative Plan: Left shoulder scope with extensive debridement, capsular release, lysis of adhesions, manipulation under anesthesia, and possible rotator cuff repair Discharge Medications: Tylenol, Celebrex, Oxycodone, Zofran DVT  Prophylaxis: None Physical Therapy: Outpatient PT Special Discharge needs: Sling   Vernetta Honey, PA-C  11/24/2019 3:12 PM

## 2019-11-25 ENCOUNTER — Encounter (HOSPITAL_BASED_OUTPATIENT_CLINIC_OR_DEPARTMENT_OTHER): Payer: Self-pay | Admitting: Orthopaedic Surgery

## 2019-11-25 ENCOUNTER — Ambulatory Visit (HOSPITAL_BASED_OUTPATIENT_CLINIC_OR_DEPARTMENT_OTHER)
Admission: RE | Admit: 2019-11-25 | Discharge: 2019-11-25 | Disposition: A | Discharge status: Home / Self Care | Payer: 59 | Attending: Orthopaedic Surgery | Admitting: Orthopaedic Surgery

## 2019-11-25 ENCOUNTER — Ambulatory Visit (HOSPITAL_BASED_OUTPATIENT_CLINIC_OR_DEPARTMENT_OTHER): Payer: 59 | Admitting: Anesthesiology

## 2019-11-25 ENCOUNTER — Other Ambulatory Visit: Payer: Self-pay

## 2019-11-25 ENCOUNTER — Encounter (HOSPITAL_BASED_OUTPATIENT_CLINIC_OR_DEPARTMENT_OTHER)
Admission: RE | Disposition: A | Discharge status: Home / Self Care | Payer: Self-pay | Source: Home / Self Care | Attending: Orthopaedic Surgery

## 2019-11-25 DIAGNOSIS — F419 Anxiety disorder, unspecified: Secondary | ICD-10-CM | POA: Insufficient documentation

## 2019-11-25 DIAGNOSIS — Z955 Presence of coronary angioplasty implant and graft: Secondary | ICD-10-CM | POA: Diagnosis not present

## 2019-11-25 DIAGNOSIS — F329 Major depressive disorder, single episode, unspecified: Secondary | ICD-10-CM | POA: Insufficient documentation

## 2019-11-25 DIAGNOSIS — K219 Gastro-esophageal reflux disease without esophagitis: Secondary | ICD-10-CM | POA: Insufficient documentation

## 2019-11-25 DIAGNOSIS — M7502 Adhesive capsulitis of left shoulder: Secondary | ICD-10-CM | POA: Diagnosis present

## 2019-11-25 DIAGNOSIS — I2510 Atherosclerotic heart disease of native coronary artery without angina pectoris: Secondary | ICD-10-CM | POA: Diagnosis not present

## 2019-11-25 DIAGNOSIS — Z79899 Other long term (current) drug therapy: Secondary | ICD-10-CM | POA: Diagnosis not present

## 2019-11-25 DIAGNOSIS — E785 Hyperlipidemia, unspecified: Secondary | ICD-10-CM | POA: Insufficient documentation

## 2019-11-25 DIAGNOSIS — I10 Essential (primary) hypertension: Secondary | ICD-10-CM | POA: Diagnosis not present

## 2019-11-25 HISTORY — PX: SHOULDER CLOSED REDUCTION: SHX1051

## 2019-11-25 HISTORY — DX: Anxiety disorder, unspecified: F41.9

## 2019-11-25 HISTORY — DX: Other complications of anesthesia, initial encounter: T88.59XA

## 2019-11-25 HISTORY — PX: SHOULDER ARTHROSCOPY: SHX128

## 2019-11-25 HISTORY — DX: Depression, unspecified: F32.A

## 2019-11-25 SURGERY — ARTHROSCOPY, SHOULDER
Anesthesia: General | Site: Shoulder | Laterality: Left

## 2019-11-25 MED ORDER — FENTANYL CITRATE (PF) 100 MCG/2ML IJ SOLN
INTRAMUSCULAR | Status: AC
  Filled 2019-11-25: qty 2

## 2019-11-25 MED ORDER — LACTATED RINGERS IV SOLN: INTRAVENOUS | Status: DC

## 2019-11-25 MED ORDER — MIDAZOLAM HCL 2 MG/2ML IJ SOLN
INTRAMUSCULAR | Status: AC
  Filled 2019-11-25: qty 2

## 2019-11-25 MED ORDER — ONDANSETRON HCL 4 MG/2ML IJ SOLN
INTRAMUSCULAR | Status: AC
  Filled 2019-11-25: qty 4

## 2019-11-25 MED ORDER — LIDOCAINE 2% (20 MG/ML) 5 ML SYRINGE
INTRAMUSCULAR | Status: AC
  Filled 2019-11-25: qty 10

## 2019-11-25 MED ORDER — CLINDAMYCIN PHOSPHATE 900 MG/50ML IV SOLN
INTRAVENOUS | Status: AC
  Filled 2019-11-25: qty 50

## 2019-11-25 MED ORDER — EPHEDRINE SULFATE-NACL 50-0.9 MG/10ML-% IV SOSY
PREFILLED_SYRINGE | INTRAVENOUS | Status: DC | PRN
  Administered 2019-11-25 (×3): 5 mg via INTRAVENOUS
  Administered 2019-11-25: 10 mg via INTRAVENOUS

## 2019-11-25 MED ORDER — LIDOCAINE 2% (20 MG/ML) 5 ML SYRINGE
INTRAMUSCULAR | Status: DC | PRN
  Administered 2019-11-25: 100 mg via INTRAVENOUS

## 2019-11-25 MED ORDER — ACETAMINOPHEN 500 MG PO TABS: 1000.0000 mg | ORAL_TABLET | Freq: Three times a day (TID) | ORAL | 0 refills | Status: AC

## 2019-11-25 MED ORDER — ROCURONIUM BROMIDE 10 MG/ML (PF) SYRINGE
PREFILLED_SYRINGE | INTRAVENOUS | Status: AC
  Filled 2019-11-25: qty 10

## 2019-11-25 MED ORDER — ETOMIDATE 2 MG/ML IV SOLN
INTRAVENOUS | Status: AC
  Filled 2019-11-25: qty 10

## 2019-11-25 MED ORDER — FENTANYL CITRATE (PF) 100 MCG/2ML IJ SOLN
50.0000 ug | INTRAMUSCULAR | Status: DC | PRN
  Administered 2019-11-25: 50 ug via INTRAVENOUS

## 2019-11-25 MED ORDER — CELECOXIB 100 MG PO CAPS: 100.0000 mg | ORAL_CAPSULE | Freq: Two times a day (BID) | ORAL | 0 refills | Status: AC

## 2019-11-25 MED ORDER — FENTANYL CITRATE (PF) 100 MCG/2ML IJ SOLN
INTRAMUSCULAR | Status: DC | PRN
  Administered 2019-11-25: 50 ug via INTRAVENOUS

## 2019-11-25 MED ORDER — ROCURONIUM BROMIDE 100 MG/10ML IV SOLN
INTRAVENOUS | Status: DC | PRN
  Administered 2019-11-25: 70 mg via INTRAVENOUS

## 2019-11-25 MED ORDER — PROMETHAZINE HCL 25 MG/ML IJ SOLN: 6.2500 mg | INTRAMUSCULAR | Status: DC | PRN

## 2019-11-25 MED ORDER — FENTANYL CITRATE (PF) 100 MCG/2ML IJ SOLN: 25.0000 ug | INTRAMUSCULAR | Status: DC | PRN

## 2019-11-25 MED ORDER — DEXAMETHASONE SODIUM PHOSPHATE 10 MG/ML IJ SOLN
INTRAMUSCULAR | Status: AC
  Filled 2019-11-25: qty 1

## 2019-11-25 MED ORDER — KETOROLAC TROMETHAMINE 30 MG/ML IJ SOLN: 30.0000 mg | Freq: Once | INTRAMUSCULAR | Status: DC | PRN

## 2019-11-25 MED ORDER — MIDAZOLAM HCL 2 MG/2ML IJ SOLN
1.0000 mg | INTRAMUSCULAR | Status: DC | PRN
  Administered 2019-11-25: 2 mg via INTRAVENOUS

## 2019-11-25 MED ORDER — SUGAMMADEX SODIUM 500 MG/5ML IV SOLN
INTRAVENOUS | Status: AC
  Filled 2019-11-25: qty 5

## 2019-11-25 MED ORDER — DROPERIDOL 2.5 MG/ML IJ SOLN
INTRAMUSCULAR | Status: DC | PRN
  Administered 2019-11-25: .625 mg via INTRAVENOUS

## 2019-11-25 MED ORDER — ONDANSETRON HCL 4 MG/2ML IJ SOLN
INTRAMUSCULAR | Status: DC | PRN
  Administered 2019-11-25 (×2): 4 mg via INTRAVENOUS

## 2019-11-25 MED ORDER — ETOMIDATE 2 MG/ML IV SOLN
INTRAVENOUS | Status: DC | PRN
  Administered 2019-11-25: 14 mg via INTRAVENOUS

## 2019-11-25 MED ORDER — ONDANSETRON HCL 4 MG PO TABS: 4.0000 mg | ORAL_TABLET | Freq: Three times a day (TID) | ORAL | 1 refills | Status: AC | PRN

## 2019-11-25 MED ORDER — EPINEPHRINE PF 1 MG/ML IJ SOLN
INTRAMUSCULAR | Status: AC
  Filled 2019-11-25: qty 1

## 2019-11-25 MED ORDER — OXYCODONE HCL 5 MG PO TABS: ORAL_TABLET | ORAL | 0 refills | Status: AC

## 2019-11-25 MED ORDER — BUPIVACAINE HCL (PF) 0.5 % IJ SOLN
INTRAMUSCULAR | Status: DC | PRN
  Administered 2019-11-25: 15 mL via PERINEURAL

## 2019-11-25 MED ORDER — DEXAMETHASONE SODIUM PHOSPHATE 10 MG/ML IJ SOLN
INTRAMUSCULAR | Status: DC | PRN
  Administered 2019-11-25: 10 mg via INTRAVENOUS

## 2019-11-25 MED ORDER — CLINDAMYCIN PHOSPHATE 900 MG/50ML IV SOLN
900.0000 mg | INTRAVENOUS | Status: AC
  Administered 2019-11-25: 900 mg via INTRAVENOUS

## 2019-11-25 MED ORDER — BUPIVACAINE LIPOSOME 1.3 % IJ SUSP
INTRAMUSCULAR | Status: DC | PRN
  Administered 2019-11-25: 10 mL via PERINEURAL

## 2019-11-25 MED ORDER — SUGAMMADEX SODIUM 200 MG/2ML IV SOLN
INTRAVENOUS | Status: DC | PRN
  Administered 2019-11-25: 350 mg via INTRAVENOUS

## 2019-11-25 SURGICAL SUPPLY — 64 items
AID PSTN UNV HD RSTRNT DISP (MISCELLANEOUS) ×1
APL PRP STRL LF DISP 70% ISPRP (MISCELLANEOUS) ×1
BLADE EXCALIBUR 4.0X13 (MISCELLANEOUS) ×2 IMPLANT
BURR OVAL 8 FLU 4.0X13 (MISCELLANEOUS) IMPLANT
CANNULA 5.75X71 LONG (CANNULA) IMPLANT
CANNULA PASSPORT 5 (CANNULA) IMPLANT
CANNULA PASSPORT BUTTON 10-40 (CANNULA) IMPLANT
CANNULA TWIST IN 8.25X7CM (CANNULA) IMPLANT
CHLORAPREP W/TINT 26 (MISCELLANEOUS) ×2 IMPLANT
CLSR STERI-STRIP ANTIMIC 1/2X4 (GAUZE/BANDAGES/DRESSINGS) ×1 IMPLANT
COVER WAND RF STERILE (DRAPES) IMPLANT
DECANTER SPIKE VIAL GLASS SM (MISCELLANEOUS) IMPLANT
DISSECTOR 3.5MM X 13CM CVD (MISCELLANEOUS) IMPLANT
DISSECTOR 4.0MMX13CM CVD (MISCELLANEOUS) IMPLANT
DRAPE IMP U-DRAPE 54X76 (DRAPES) ×2 IMPLANT
DRAPE INCISE IOBAN 66X45 STRL (DRAPES) IMPLANT
DRAPE SHOULDER BEACH CHAIR (DRAPES) ×2 IMPLANT
DRSG PAD ABDOMINAL 8X10 ST (GAUZE/BANDAGES/DRESSINGS) ×2 IMPLANT
DW OUTFLOW CASSETTE/TUBE SET (MISCELLANEOUS) ×2 IMPLANT
GAUZE SPONGE 4X4 12PLY STRL (GAUZE/BANDAGES/DRESSINGS) ×2 IMPLANT
GAUZE XEROFORM 1X8 LF (GAUZE/BANDAGES/DRESSINGS) IMPLANT
GLOVE BIO SURGEON STRL SZ 6.5 (GLOVE) ×2 IMPLANT
GLOVE BIOGEL PI IND STRL 6.5 (GLOVE) ×1 IMPLANT
GLOVE BIOGEL PI IND STRL 7.0 (GLOVE) IMPLANT
GLOVE BIOGEL PI IND STRL 8 (GLOVE) ×1 IMPLANT
GLOVE BIOGEL PI INDICATOR 6.5 (GLOVE) ×1
GLOVE BIOGEL PI INDICATOR 7.0 (GLOVE) ×2
GLOVE BIOGEL PI INDICATOR 8 (GLOVE) ×1
GLOVE ECLIPSE 7.0 STRL STRAW (GLOVE) ×1 IMPLANT
GLOVE ECLIPSE 8.0 STRL XLNG CF (GLOVE) ×2 IMPLANT
GOWN STRL REUS W/ TWL LRG LVL3 (GOWN DISPOSABLE) ×1 IMPLANT
GOWN STRL REUS W/TWL LRG LVL3 (GOWN DISPOSABLE) ×2
GOWN STRL REUS W/TWL XL LVL3 (GOWN DISPOSABLE) ×1 IMPLANT
KIT STABILIZATION SHOULDER (MISCELLANEOUS) ×2 IMPLANT
KIT STR SPEAR 1.8 FBRTK DISP (KITS) IMPLANT
LASSO 90 CVE QUICKPAS (DISPOSABLE) IMPLANT
LASSO CRESCENT QUICKPASS (SUTURE) IMPLANT
MANIFOLD NEPTUNE II (INSTRUMENTS) ×2 IMPLANT
NDL SAFETY ECLIPSE 18X1.5 (NEEDLE) ×1 IMPLANT
NDL SCORPION MULTI FIRE (NEEDLE) IMPLANT
NEEDLE HYPO 18GX1.5 SHARP (NEEDLE) ×2
NEEDLE SCORPION MULTI FIRE (NEEDLE) IMPLANT
PACK ARTHROSCOPY DSU (CUSTOM PROCEDURE TRAY) ×2 IMPLANT
PAD ORTHO SHOULDER 7X19 LRG (SOFTGOODS) ×2 IMPLANT
PORT APPOLLO RF 90DEGREE MULTI (SURGICAL WAND) ×2 IMPLANT
RESTRAINT HEAD UNIVERSAL NS (MISCELLANEOUS) ×2 IMPLANT
SET BASIN DAY SURGERY F.S. (CUSTOM PROCEDURE TRAY) ×2 IMPLANT
SHEET MEDIUM DRAPE 40X70 STRL (DRAPES) ×2 IMPLANT
SLEEVE SCD COMPRESS KNEE MED (MISCELLANEOUS) ×2 IMPLANT
SLING ARM FOAM STRAP LRG (SOFTGOODS) IMPLANT
SPONGE LAP 4X18 RFD (DISPOSABLE) IMPLANT
SUT FIBERWIRE #2 38 T-5 BLUE (SUTURE)
SUT MNCRL AB 4-0 PS2 18 (SUTURE) ×2 IMPLANT
SUT PDS AB 1 CT  36 (SUTURE)
SUT PDS AB 1 CT 36 (SUTURE) IMPLANT
SUT TIGER TAPE 7 IN WHITE (SUTURE) IMPLANT
SUTURE FIBERWR #2 38 T-5 BLUE (SUTURE) IMPLANT
SUTURE TAPE TIGERLINK 1.3MM BL (SUTURE) IMPLANT
SUTURETAPE TIGERLINK 1.3MM BL (SUTURE)
SYR 5ML LL (SYRINGE) ×2 IMPLANT
TAPE FIBER 2MM 7IN #2 BLUE (SUTURE) IMPLANT
TOWEL GREEN STERILE FF (TOWEL DISPOSABLE) ×2 IMPLANT
TUBE CONNECTING 20X1/4 (TUBING) ×2 IMPLANT
TUBING ARTHROSCOPY IRRIG 16FT (MISCELLANEOUS) ×2 IMPLANT

## 2019-11-25 NOTE — Transfer of Care (Signed)
Immediate Anesthesia Transfer of Care Note  Patient: Miranda Baker  Procedure(s) Performed: SHOULDER ARTHROSCOPY WITH DEBRIDEMENT EXTENSIVE WITH LYSIS OF ADHESIONS (Left Shoulder) CLOSED MANIPULATION SHOULDER (Left Shoulder)  Patient Location: PACU  Anesthesia Type:General  Level of Consciousness: awake, alert  and oriented  Airway & Oxygen Therapy: Patient Spontanous Breathing and Patient connected to face mask oxygen  Post-op Assessment: Report given to RN and Post -op Vital signs reviewed and stable  Post vital signs: Reviewed and stable  Last Vitals:  Vitals Value Taken Time  BP    Temp    Pulse 75 11/25/19 1133  Resp 13 11/25/19 1133  SpO2 94 % 11/25/19 1133  Vitals shown include unvalidated device data.  Last Pain:  Vitals:   11/25/19 0811  TempSrc: Oral  PainSc: 0-No pain      Patients Stated Pain Goal: 3 (11/25/19 2508)  Complications: No apparent anesthesia complications

## 2019-11-25 NOTE — Interval H&P Note (Signed)
History and Physical Interval Note:  11/25/2019 10:03 AM  Miranda Baker  has presented today for surgery, with the diagnosis of LEFT SHOULDER ADHESIVE CAPSULITIS.  The various methods of treatment have been discussed with the patient and family. After consideration of risks, benefits and other options for treatment, the patient has consented to  Procedure(s): SHOULDER ARTHROSCOPY WITH DEBRIDEMENT EXTENSIVE WITH LYSIS OF ADHESIONS (Left) CLOSED MANIPULATION SHOULDER (Left) as a surgical intervention.  The patient's history has been reviewed, patient examined, no change in status, stable for surgery.  I have reviewed the patient's chart and labs.  Questions were answered to the patient's satisfaction.     Bjorn Pippin

## 2019-11-25 NOTE — Anesthesia Postprocedure Evaluation (Signed)
Anesthesia Post Note  Patient: Miranda Baker  Procedure(s) Performed: SHOULDER ARTHROSCOPY WITH DEBRIDEMENT EXTENSIVE WITH LYSIS OF ADHESIONS (Left Shoulder) CLOSED MANIPULATION SHOULDER (Left Shoulder)     Patient location during evaluation: PACU Anesthesia Type: General Level of consciousness: awake and alert Pain management: pain level controlled Vital Signs Assessment: post-procedure vital signs reviewed and stable Respiratory status: spontaneous breathing, nonlabored ventilation, respiratory function stable and patient connected to nasal cannula oxygen Cardiovascular status: blood pressure returned to baseline and stable Postop Assessment: no apparent nausea or vomiting Anesthetic complications: no    Last Vitals:  Vitals:   11/25/19 1142 11/25/19 1145  BP:  111/68  Pulse: 68 73  Resp: 13 16  Temp:    SpO2: 100% 94%    Last Pain:  Vitals:   11/25/19 1132  TempSrc:   PainSc: 0-No pain                 Hendrix Console S

## 2019-11-25 NOTE — Anesthesia Procedure Notes (Signed)
Anesthesia Regional Block: Interscalene brachial plexus block   Pre-Anesthetic Checklist: ,, timeout performed, Correct Patient, Correct Site, Correct Laterality, Correct Procedure, Correct Position, site marked, Risks and benefits discussed,  Surgical consent,  Pre-op evaluation,  At surgeon's request and post-op pain management  Laterality: Left  Prep: chloraprep       Needles:  Injection technique: Single-shot  Needle Type: Echogenic Needle     Needle Length: 9cm      Additional Needles:   Procedures:,,,, ultrasound used (permanent image in chart),,,,  Narrative:  Start time: 11/25/2019 9:08 AM End time: 11/25/2019 9:16 AM Injection made incrementally with aspirations every 5 mL.  Performed by: Personally  Anesthesiologist: Eilene Ghazi, MD  Additional Notes: Patient tolerated the procedure well without complications

## 2019-11-25 NOTE — Op Note (Signed)
Orthopaedic Surgery Operative Note (CSN: [id number])  Miranda Baker  07-25-1961 Date of Surgery: 11/25/2019   Diagnoses:  LEFT SHOULDER ADHESIVE CAPSULITIS  Procedure: Arthroscopic extensive debridement  Arthroscopic capsular release with extensive lysis of adhesions   Operative Finding Exam under anesthesia: Preoperatively elbow external rotation about 20 degrees, forward flexion and 90 degrees of glenohumeral motion, postoperatively 80 degrees external rotation and 170 degrees of forward flexion Articular space: No loose bodies, significant capsulitis noted with inflammatory change and thickening of the capsule anteriorly primarily and in the interval  chondral surfaces:Intact, no sign of chondral degeneration on the glenoid or humeral head Biceps: Mild inflammation but normal Subscapularis: Normal Superior Cuff: Normal Bursal side: Perhaps 5-10% partial-thickness tearing of the supraspinatus at most with minimal other findings.  Successful completion of the planned procedure.  There are some concern of patient's rotator cuff integrity however this turned out patient rotator cuff looked essentially normal.   Post-operative plan: The patient will be non-weightbearing in a sling for 2 to 3 days until her nerve block wears off.  The patient will be discharged home.  DVT prophylaxis not indicated in ambulatory upper extremity patient without known risk factors.   Pain control with PRN pain medication preferring oral medicines.  Follow up plan will be scheduled in approximately 7 days for incision check and XR. Post-Op Diagnosis: Same Surgeons:Primary: Hiram Gash, MD Assistants:Caroline McBane PA-C Location: [facility] ROOM 6 Anesthesia: General with Exparel interscalene block Antibiotics: Clindamycin Tourniquet time: None Estimated Blood Loss: Minimal Complications: None Specimens: None Implants: * No implants in log *  Indications for Surgery:   Miranda Baker is a 59 y.o. female  with continued shoulder pain refractory to nonoperative measures for extended period of time.  Patient's MRI demonstrated findings concerning for adhesive capsulitis additionally the possibility of a partial-thickness cuff tear.  The risks and benefits were explained at length including but not limited to continued pain, cuff failure, biceps tenodesis failure, stiffness, need for further surgery and infection.   Procedure:   Patient was correctly identified in the preoperative holding area and operative site marked.  Patient brought to OR and positioned beachchair on an [place] table ensuring that all bony prominences were padded and the head was in an appropriate location.  Anesthesia was induced and the operative shoulder was prepped and draped in the usual sterile fashion.  Timeout was called preincision.  A standard posterior viewing portal was made after localizing the portal with a spinal needle.  An anterior accessory portal was also made.  After clearing the articular space the camera was positioned in the subacromial space.  Findings above.    Extensive debridement was performed of the anterior interval tissue, labral fraying and the bursa.  We first skeletonized the rotator interval noting intact cuff and then continued our release of the [facility] L and the [facility] L using a combination of a radiofrequency ablator as well as a basket stain just off the glenoid labrum.  We were able to do the same from the posterior approach.  Then performed gentle manipulation and motion as above.  We went subacromial and checked the cuff and it all appeared to be well.  The incisions were closed with absorbable monocryl and steri strips.  A sterile dressing was placed along with a sling. The patient was awoken from general anesthesia and taken to the PACU in stable condition without complication.   Noemi Chapel, PA-C, present and scrubbed throughout the case, critical for completion in  a timely fashion, and for  retraction, instrumentation, closure.

## 2019-11-25 NOTE — Progress Notes (Signed)
Assisted Dr. Rose with left, ultrasound guided, interscalene  block. Side rails up, monitors on throughout procedure. See vital signs in flow sheet. Tolerated Procedure well.  

## 2019-11-25 NOTE — Anesthesia Procedure Notes (Signed)
Anesthesia Procedure Image    

## 2019-11-25 NOTE — Anesthesia Procedure Notes (Signed)
Procedure Name: Intubation Date/Time: 11/25/2019 10:40 AM Performed by: Marny Lowenstein, CRNA Pre-anesthesia Checklist: Patient identified, Emergency Drugs available, Suction available and Patient being monitored Patient Re-evaluated:Patient Re-evaluated prior to induction Oxygen Delivery Method: Circle system utilized Preoxygenation: Pre-oxygenation with 100% oxygen Induction Type: IV induction Ventilation: Mask ventilation without difficulty Laryngoscope Size: Miller and 2 Grade View: Grade III Tube type: Oral Tube size: 7.0 mm Number of attempts: 1 Airway Equipment and Method: Patient positioned with wedge pillow and Stylet Placement Confirmation: ETT inserted through vocal cords under direct vision,  positive ETCO2 and breath sounds checked- equal and bilateral Secured at: 21 cm Tube secured with: Tape Dental Injury: Teeth and Oropharynx as per pre-operative assessment  Difficulty Due To: Difficulty was anticipated and Difficult Airway- due to anterior larynx

## 2019-11-25 NOTE — Discharge Instructions (Signed)
°Post Anesthesia Home Care Instructions ° °Activity: °Get plenty of rest for the remainder of the day. A responsible individual must stay with you for 24 hours following the procedure.  °For the next 24 hours, DO NOT: °-Drive a car °-Operate machinery °-Drink alcoholic beverages °-Take any medication unless instructed by your physician °-Make any legal decisions or sign important papers. ° °Meals: °Start with liquid foods such as gelatin or soup. Progress to regular foods as tolerated. Avoid greasy, spicy, heavy foods. If nausea and/or vomiting occur, drink only clear liquids until the nausea and/or vomiting subsides. Call your physician if vomiting continues. ° °Special Instructions/Symptoms: °Your throat may feel dry or sore from the anesthesia or the breathing tube placed in your throat during surgery. If this causes discomfort, gargle with warm salt water. The discomfort should disappear within 24 hours. ° °If you had a scopolamine patch placed behind your ear for the management of post- operative nausea and/or vomiting: ° °1. The medication in the patch is effective for 72 hours, after which it should be removed.  Wrap patch in a tissue and discard in the trash. Wash hands thoroughly with soap and water. °2. You may remove the patch earlier than 72 hours if you experience unpleasant side effects which may include dry mouth, dizziness or visual disturbances. °3. Avoid touching the patch. Wash your hands with soap and water after contact with the patch. °  ° °Post Anesthesia Home Care Instructions ° °Activity: °Get plenty of rest for the remainder of the day. A responsible individual must stay with you for 24 hours following the procedure.  °For the next 24 hours, DO NOT: °-Drive a car °-Operate machinery °-Drink alcoholic beverages °-Take any medication unless instructed by your physician °-Make any legal decisions or sign important papers. ° °Meals: °Start with liquid foods such as gelatin or soup. Progress to  regular foods as tolerated. Avoid greasy, spicy, heavy foods. If nausea and/or vomiting occur, drink only clear liquids until the nausea and/or vomiting subsides. Call your physician if vomiting continues. ° °Special Instructions/Symptoms: °Your throat may feel dry or sore from the anesthesia or the breathing tube placed in your throat during surgery. If this causes discomfort, gargle with warm salt water. The discomfort should disappear within 24 hours. ° °If you had a scopolamine patch placed behind your ear for the management of post- operative nausea and/or vomiting: ° °1. The medication in the patch is effective for 72 hours, after which it should be removed.  Wrap patch in a tissue and discard in the trash. Wash hands thoroughly with soap and water. °2. You may remove the patch earlier than 72 hours if you experience unpleasant side effects which may include dry mouth, dizziness or visual disturbances. °3. Avoid touching the patch. Wash your hands with soap and water after contact with the patch. °  ° ° ° °Regional Anesthesia Blocks ° °1. Numbness or the inability to move the "blocked" extremity may last from 3-48 hours after placement. The length of time depends on the medication injected and your individual response to the medication. If the numbness is not going away after 48 hours, call your surgeon. ° °2. The extremity that is blocked will need to be protected until the numbness is gone and the  Strength has returned. Because you cannot feel it, you will need to take extra care to avoid injury. Because it may be weak, you may have difficulty moving it or using it. You may not know what   position it is in without looking at it while the block is in effect. ° °3. For blocks in the legs and feet, returning to weight bearing and walking needs to be done carefully. You will need to wait until the numbness is entirely gone and the strength has returned. You should be able to move your leg and foot normally  before you try and bear weight or walk. You will need someone to be with you when you first try to ensure you do not fall and possibly risk injury. ° °4. Bruising and tenderness at the needle site are common side effects and will resolve in a few days. ° °5. Persistent numbness or new problems with movement should be communicated to the surgeon or the [facility] ([phone])/ [facility] ([phone]). °

## 2019-11-25 NOTE — Anesthesia Preprocedure Evaluation (Signed)
Anesthesia Evaluation  Patient identified by MRN, date of birth, ID band Patient awake    Reviewed: Allergy & Precautions, NPO status , Patient's Chart, lab work & pertinent test results  Airway Mallampati: II  TM Distance: >3 FB Neck ROM: Full    Dental no notable dental hx.    Pulmonary neg pulmonary ROS,    Pulmonary exam normal breath sounds clear to auscultation       Cardiovascular hypertension, + CAD and + Cardiac Stents  Normal cardiovascular exam Rhythm:Regular Rate:Normal     Neuro/Psych negative neurological ROS  negative psych ROS   GI/Hepatic negative GI ROS, Neg liver ROS,   Endo/Other  negative endocrine ROS  Renal/GU negative Renal ROS  negative genitourinary   Musculoskeletal negative musculoskeletal ROS (+)   Abdominal   Peds negative pediatric ROS (+)  Hematology negative hematology ROS (+)   Anesthesia Other Findings   Reproductive/Obstetrics negative OB ROS                             Anesthesia Physical Anesthesia Plan  ASA: III  Anesthesia Plan: General   Post-op Pain Management:  Regional for Post-op pain   Induction: Intravenous  PONV Risk Score and Plan: 3 and Ondansetron, Dexamethasone and Treatment may vary due to age or medical condition  Airway Management Planned: Oral ETT  Additional Equipment:   Intra-op Plan:   Post-operative Plan: Extubation in OR  Informed Consent: I have reviewed the patients History and Physical, chart, labs and discussed the procedure including the risks, benefits and alternatives for the proposed anesthesia with the patient or authorized representative who has indicated his/her understanding and acceptance.     Dental advisory given  Plan Discussed with: CRNA and Surgeon  Anesthesia Plan Comments:         Anesthesia Quick Evaluation

## 2019-11-26 ENCOUNTER — Encounter: Payer: Self-pay | Admitting: *Deleted

## 2019-11-30 NOTE — Addendum Note (Signed)
Addendum  created 11/30/19 1139 by Zymier Rodgers, Jewel Baize, CRNA   Charge Capture section accepted

## 2019-12-01 ENCOUNTER — Ambulatory Visit: Payer: Self-pay

## 2020-01-04 ENCOUNTER — Ambulatory Visit: Payer: 59 | Attending: Internal Medicine

## 2020-01-04 DIAGNOSIS — Z23 Encounter for immunization: Secondary | ICD-10-CM

## 2020-01-04 NOTE — Progress Notes (Signed)
   Covid-19 Vaccination Clinic  Name:  GENIFER LAZENBY    MRN: [id number] DOB: January 08, 1961  01/04/2020  Ms. Esson was observed post Covid-19 immunization for 30 minutes based on pre-vaccination screening without incident. She was provided with Vaccine Information Sheet and instruction to access the V-Safe system.   Ms. Tisby was instructed to call [phone] with any severe reactions post vaccine: Marland Kitchen Difficulty breathing  . Swelling of face and throat  . A fast heartbeat  . A bad rash all over body  . Dizziness and weakness   Immunizations Administered    Name Date Dose VIS Date Route   Pfizer COVID-19 Vaccine 01/04/2020  4:30 PM 0.3 mL 09/29/2018 Intramuscular   Manufacturer: [employer], [employer]   Lot: [id number]   NDC: [address]-8

## 2020-01-24 ENCOUNTER — Telehealth: Payer: Self-pay | Admitting: *Deleted

## 2020-01-24 NOTE — Telephone Encounter (Signed)
    Pharmacy informed. Youcef Klas, CMA  

## 2020-01-24 NOTE — Telephone Encounter (Signed)
Received fax from pharmacy, PA needed on Trulance.  Clinical questions submitted via Cover My Meds.  Waiting on response, could take up to 72 hours.  Cover My Meds info: Key: [id number]  Jone Baseman, CMA

## 2020-02-28 ENCOUNTER — Other Ambulatory Visit: Payer: Self-pay | Admitting: Family Medicine

## 2020-02-28 MED ORDER — AZITHROMYCIN 250 MG PO TABS: 250.0000 mg | ORAL_TABLET | ORAL | 0 refills | Status: DC

## 2020-02-28 MED FILL — AZITHROMYCIN 250 MG TABS: 250 | 5 days supply | Qty: 6 | Fill #0

## 2020-02-28 MED FILL — CITALOPRAM HBR 40 MG TABLET: 40 | 3 days supply | Qty: 90 | Fill #0

## 2020-03-13 ENCOUNTER — Other Ambulatory Visit: Payer: Self-pay | Admitting: Family Medicine

## 2020-03-13 DIAGNOSIS — E7800 Pure hypercholesterolemia, unspecified: Secondary | ICD-10-CM

## 2020-03-13 MED ORDER — ROSUVASTATIN CALCIUM 20 MG PO TABS: 20.0000 mg | ORAL_TABLET | Freq: Every day | ORAL | 3 refills | Status: DC

## 2020-03-13 MED ORDER — METHYLPHENIDATE HCL ER 20 MG PO TBCR: EXTENDED_RELEASE_TABLET | ORAL | 0 refills | Status: DC

## 2020-03-13 MED ORDER — MONTELUKAST SODIUM 10 MG PO TABS: 10.0000 mg | ORAL_TABLET | Freq: Every day | ORAL | 3 refills | Status: DC

## 2020-03-29 ENCOUNTER — Ambulatory Visit (INDEPENDENT_AMBULATORY_CARE_PROVIDER_SITE_OTHER): Payer: 59 | Admitting: Family Medicine

## 2020-03-29 ENCOUNTER — Encounter: Payer: Self-pay | Admitting: Family Medicine

## 2020-03-29 ENCOUNTER — Other Ambulatory Visit: Payer: Self-pay

## 2020-03-29 VITALS — BP 110/72 | HR 61 | Ht 64.0 in | Wt 195.4 lb

## 2020-03-29 DIAGNOSIS — I10 Essential (primary) hypertension: Secondary | ICD-10-CM

## 2020-03-29 DIAGNOSIS — J3089 Other allergic rhinitis: Secondary | ICD-10-CM | POA: Diagnosis not present

## 2020-03-29 DIAGNOSIS — Z0000 Encounter for general adult medical examination without abnormal findings: Secondary | ICD-10-CM | POA: Diagnosis not present

## 2020-03-29 MED ORDER — FLUTICASONE PROPIONATE 50 MCG/ACT NA SUSP: 2.0000 | Freq: Every day | NASAL | 6 refills | Status: DC

## 2020-03-29 NOTE — Progress Notes (Signed)
    CHIEF COMPLAINT / HPI:  Check up Doing well No problems with meds Having a lot of post-nasal drainage despite her allergy pills Has  Not gotten mamogram   PERTINENT  PMH / PSH: I have reviewed the patient's medications, allergies, past medical and surgical history, smoking status and updated in the EMR as appropriate.   OBJECTIVE:  BP 110/72   Pulse 61   Ht 5\' 4"  (1.626 m)   Wt 195 lb 6.4 oz (88.6 kg)   SpO2 97%   BMI 33.54 kg/m   Vital signs reviewed. GENERAL: Well-developed, well-nourished, no acute distress. CARDIOVASCULAR: Regular rate and rhythm no murmur gallop or rub LUNGS: Clear to auscultation bilaterally, no rales or wheeze. ABDOMEN: Soft positive bowel sounds NEURO: No gross focal neurological deficits. MSK: Movement of extremity x 4. PSYCH: AxOx4. Good eye contact.. No psychomotor retardation or agitation. Appropriate speech fluency and content. Asks and answers questions appropriately. Mood is congruent.   ASSESSMENT / PLAN:   Well adult exam Labs She needs f/u with her cardiologist and she will schedule Needs mammogram and she will schedule Has had COVID   Allergic rhinitis Add flonase back for post nasal d/c conitnue other meds   MD

## 2020-03-29 NOTE — Assessment & Plan Note (Signed)
Add flonase back for post nasal d/c conitnue other meds

## 2020-03-29 NOTE — Assessment & Plan Note (Signed)
Labs She needs f/u with her cardiologist and she will schedule Needs mammogram and she will schedule Has had COVID

## 2020-03-30 LAB — CMP14+EGFR
ALT: 26 IU/L (ref 0–32)
AST: 22 IU/L (ref 0–40)
Albumin/Globulin Ratio: 2 (ref 1.2–2.2)
Albumin: 4.5 g/dL (ref 3.8–4.9)
Alkaline Phosphatase: 62 IU/L (ref 48–121)
BUN/Creatinine Ratio: 15 (ref 12–28)
BUN: 14 mg/dL (ref 8–27)
Bilirubin Total: 0.5 mg/dL (ref 0.0–1.2)
CO2: 24 mmol/L (ref 20–29)
Calcium: 10 mg/dL (ref 8.7–10.3)
Chloride: 101 mmol/L (ref 96–106)
Creatinine, Ser: 0.95 mg/dL (ref 0.57–1.00)
GFR calc Af Amer: 75 mL/min/{1.73_m2} (ref >59)
GFR calc non Af Amer: 65 mL/min/{1.73_m2} (ref >59)
Globulin, Total: 2.3 g/dL (ref 1.5–4.5)
Glucose: 92 mg/dL (ref 65–99)
Potassium: 4.3 mmol/L (ref 3.5–5.2)
Sodium: 139 mmol/L (ref 134–144)
Total Protein: 6.8 g/dL (ref 6.0–8.5)

## 2020-03-30 LAB — LDL CHOLESTEROL, DIRECT: LDL Direct: 64 mg/dL (ref 0–99)

## 2020-04-03 ENCOUNTER — Encounter: Payer: Self-pay | Admitting: Family Medicine

## 2020-04-03 NOTE — Progress Notes (Signed)
OK 

## 2020-05-01 ENCOUNTER — Other Ambulatory Visit: Payer: Self-pay | Admitting: Family Medicine

## 2020-05-28 ENCOUNTER — Other Ambulatory Visit: Payer: Self-pay | Admitting: Family Medicine

## 2020-06-27 ENCOUNTER — Other Ambulatory Visit: Payer: Self-pay | Admitting: Family Medicine

## 2020-06-28 MED ORDER — METHYLPHENIDATE HCL ER 20 MG PO TBCR: EXTENDED_RELEASE_TABLET | ORAL | 0 refills | Status: DC

## 2020-07-03 ENCOUNTER — Telehealth: Payer: Self-pay | Admitting: *Deleted

## 2020-07-03 NOTE — Telephone Encounter (Signed)
Received fax from [facility] pharmacy that says insurance will pay for quantity of 30 in 30 days for METHYLPHENIDATE or you need a PA.Salvatore Shear Zimmerman Rumple, CMA

## 2020-07-03 NOTE — Telephone Encounter (Signed)
Dear [place] [facility] Team I guess we need a PA then. They HAVE been filling it up till now this way. THANKS! Denny Levy

## 2020-07-04 NOTE — Telephone Encounter (Signed)
Hi Hannah and Page,  Will you work Programmer, systems with this PA?  Melvenia Beam

## 2020-07-19 NOTE — Telephone Encounter (Signed)
Sorry for delay. Received fax from pharmacy, PA needed on Methylphenidate.  Clinical questions submitted via Cover My Meds. Waiting on response, could take up to 72 hours.  Cover My Meds info: Key: [id number]

## 2020-07-23 ENCOUNTER — Other Ambulatory Visit: Payer: Self-pay | Admitting: Family Medicine

## 2020-07-23 ENCOUNTER — Encounter: Payer: Self-pay | Admitting: Family Medicine

## 2020-07-23 MED ORDER — METHYLPHENIDATE HCL ER 20 MG PO TBCR: EXTENDED_RELEASE_TABLET | ORAL | 0 refills | Status: DC

## 2020-07-23 MED ORDER — TERCONAZOLE 0.4 % VA CREA: 1.0000 | TOPICAL_CREAM | Freq: Every day | VAGINAL | 0 refills | Status: DC

## 2020-08-06 ENCOUNTER — Other Ambulatory Visit: Payer: Self-pay | Admitting: Family Medicine

## 2020-08-17 ENCOUNTER — Other Ambulatory Visit: Payer: Self-pay | Admitting: Family Medicine

## 2020-09-01 ENCOUNTER — Other Ambulatory Visit: Payer: Self-pay | Admitting: Family Medicine

## 2020-09-01 MED ORDER — METHYLPHENIDATE HCL ER 20 MG PO TBCR: EXTENDED_RELEASE_TABLET | ORAL | 0 refills | Status: DC

## 2020-09-13 ENCOUNTER — Other Ambulatory Visit: Payer: Self-pay | Admitting: Cardiology

## 2020-09-15 ENCOUNTER — Other Ambulatory Visit: Payer: Self-pay | Admitting: Family Medicine

## 2020-09-15 ENCOUNTER — Telehealth: Payer: Self-pay

## 2020-09-15 MED ORDER — CLOTRIMAZOLE-BETAMETHASONE 1-0.05 % EX CREA: 1.0000 "application " | TOPICAL_CREAM | Freq: Two times a day (BID) | CUTANEOUS | 1 refills | Status: DC

## 2020-09-15 MED ORDER — FLUCONAZOLE 100 MG PO TABS: 100.0000 mg | ORAL_TABLET | Freq: Every day | ORAL | 1 refills | Status: DC

## 2020-09-15 NOTE — Progress Notes (Signed)
Phone conversation regarding G you rash. I had seen her for back in December and tried Terazol vaginal cream which helps some but then it has continued to get worse in the last 3 weeks or so. Also involving the perineal area. She has been using some OTC steroid cream with some minimal relief. We'll try nystatin ointment twice daily and oral Diflucan. She'll give me a call middle of next week and let me know how she is doing.

## 2020-09-15 NOTE — Telephone Encounter (Signed)
Called pharmacy and OK meds together.

## 2020-09-15 NOTE — Telephone Encounter (Signed)
[facility] LVM on nurse line stating they need a verbal ok to dispense diflucan. Diflucan has a drug to drug interaction with citalopram and alprazolam. Prolonged QT symptoms may occur. Please advise. I can call the pharmacy to let them know.

## 2020-09-29 ENCOUNTER — Encounter: Payer: Self-pay | Admitting: Family Medicine

## 2020-09-29 ENCOUNTER — Other Ambulatory Visit: Payer: Self-pay

## 2020-09-29 ENCOUNTER — Ambulatory Visit: Payer: BC Managed Care – PPO | Admitting: Family Medicine

## 2020-09-29 VITALS — BP 118/60 | HR 65 | Ht 64.0 in | Wt 192.0 lb

## 2020-09-29 DIAGNOSIS — R5383 Other fatigue: Secondary | ICD-10-CM | POA: Diagnosis not present

## 2020-09-29 DIAGNOSIS — K649 Unspecified hemorrhoids: Secondary | ICD-10-CM

## 2020-09-29 DIAGNOSIS — F341 Dysthymic disorder: Secondary | ICD-10-CM

## 2020-09-29 DIAGNOSIS — R41840 Attention and concentration deficit: Secondary | ICD-10-CM | POA: Diagnosis not present

## 2020-09-29 DIAGNOSIS — B3789 Other sites of candidiasis: Secondary | ICD-10-CM | POA: Diagnosis not present

## 2020-09-29 DIAGNOSIS — I2510 Atherosclerotic heart disease of native coronary artery without angina pectoris: Secondary | ICD-10-CM | POA: Diagnosis not present

## 2020-09-29 LAB — POCT GLYCOSYLATED HEMOGLOBIN (HGB A1C): Hemoglobin A1C: 5.5 % (ref 4.0–5.6)

## 2020-09-29 NOTE — Patient Instructions (Addendum)
Get your MAMMOGRAM!!!! [facility] I will send you a note about your labs!

## 2020-09-30 LAB — LIPID PANEL
Chol/HDL Ratio: 2.9 ratio (ref 0.0–4.4)
Cholesterol, Total: 171 mg/dL (ref 100–199)
HDL: 59 mg/dL (ref >39)
LDL Chol Calc (NIH): 76 mg/dL (ref 0–99)
Triglycerides: 217 mg/dL — ABNORMAL HIGH (ref 0–149)
VLDL Cholesterol Cal: 36 mg/dL (ref 5–40)

## 2020-09-30 LAB — BASIC METABOLIC PANEL
BUN/Creatinine Ratio: 20 (ref 12–28)
BUN: 21 mg/dL (ref 8–27)
CO2: 23 mmol/L (ref 20–29)
Calcium: 10.7 mg/dL — ABNORMAL HIGH (ref 8.7–10.3)
Chloride: 102 mmol/L (ref 96–106)
Creatinine, Ser: 1.04 mg/dL — ABNORMAL HIGH (ref 0.57–1.00)
GFR calc Af Amer: 67 mL/min/{1.73_m2} (ref >59)
GFR calc non Af Amer: 58 mL/min/{1.73_m2} — ABNORMAL LOW (ref >59)
Glucose: 86 mg/dL (ref 65–99)
Potassium: 4.5 mmol/L (ref 3.5–5.2)
Sodium: 141 mmol/L (ref 134–144)

## 2020-09-30 LAB — TSH: TSH: 3.19 u[IU]/mL (ref 0.450–4.500)

## 2020-10-01 ENCOUNTER — Encounter: Payer: Self-pay | Admitting: Family Medicine

## 2020-10-01 NOTE — Progress Notes (Signed)
    CHIEF COMPLAINT / HPI: 1. F/u recnt skin fungal infection GU area. Much improved. Has stopped the meds as directed. 2. Hemorrhoid is bothering her. Considering treatment options 3. HTN: no problems w meds. Taking regularly. No new worrisome symptoms.  4. ADHD: meds still helpful but finding herself with a little more difficulty focusing. Not sure if she needs med adjustment or if it is just "aging". Working full time and likes her job. Maybe feels a little more fatigued than she would like but nothing that limits her activities. 5. Stressors stable.   PERTINENT  PMH / PSH: I have reviewed the patient's medications, allergies, past medical and surgical history, smoking status and updated in the EMR as appropriate.   OBJECTIVE:  BP 118/60   Pulse 65   Ht 5\' 4"  (1.626 m)   Wt 192 lb (87.1 kg)   SpO2 97%   BMI 32.96 kg/m  Vital signs reviewed. GENERAL: Well-developed, well-nourished, no acute distress. CARDIOVASCULAR: Regular rate and rhythm no murmur gallop or rub LUNGS: Clear to auscultation bilaterally, no rales or wheeze. PSYCH: AxOx4. Good eye contact.. No psychomotor retardation or agitation. Appropriate speech fluency and content. Asks and answers questions appropriately. Mood is congruent. MSK: Movement of extremity x 4.    ASSESSMENT / PLAN: 1. Fungal infection of GU area resolved. Have d/c medicine. 2. Fatigue: mild and non specific. Will monitor, 3. Hemorrhoid: gen surgery consult for discussion treatment options. 4. HTN: Labs to monitor meds 5. Health Maintenance: get your mammogram Attention and concentration deficit For now continue current regimen of meds and monitor symptoms. Will let me know if things change significantly.  Dysthymia Stable Continue current meds   MD

## 2020-10-01 NOTE — Assessment & Plan Note (Signed)
Stable. Continue current meds.   

## 2020-10-01 NOTE — Assessment & Plan Note (Signed)
For now continue current regimen of meds and monitor symptoms. Will let me know if things change significantly.

## 2020-10-02 ENCOUNTER — Other Ambulatory Visit: Payer: Self-pay | Admitting: Family Medicine

## 2020-10-03 ENCOUNTER — Encounter: Payer: Self-pay | Admitting: Family Medicine

## 2020-10-09 ENCOUNTER — Encounter: Payer: Self-pay | Admitting: Cardiology

## 2020-10-09 ENCOUNTER — Other Ambulatory Visit: Payer: Self-pay

## 2020-10-09 ENCOUNTER — Ambulatory Visit: Payer: BC Managed Care – PPO | Admitting: Cardiology

## 2020-10-09 VITALS — BP 112/54 | HR 61 | Temp 98.2°F | Resp 17 | Ht 64.0 in | Wt 191.6 lb

## 2020-10-09 DIAGNOSIS — E7800 Pure hypercholesterolemia, unspecified: Secondary | ICD-10-CM

## 2020-10-09 DIAGNOSIS — I2510 Atherosclerotic heart disease of native coronary artery without angina pectoris: Secondary | ICD-10-CM | POA: Diagnosis not present

## 2020-10-09 DIAGNOSIS — I10 Essential (primary) hypertension: Secondary | ICD-10-CM | POA: Diagnosis not present

## 2020-10-09 MED ORDER — ICOSAPENT ETHYL 1 G PO CAPS: 2.0000 g | ORAL_CAPSULE | Freq: Every day | ORAL | 3 refills | Status: DC

## 2020-10-09 NOTE — Progress Notes (Signed)
Subjective:  Primary Physician:  Nestor RampNeal, Sara L, MD  Patient ID: Richmond Campbellarcy J Goris, female    DOB: 1960-08-15, 60 y.o.   MRN: [id number]  Chief Complaint  Patient presents with  . Hypertension  . Coronary Artery Disease  . Follow-up    1 year    HPI: Richmond CampbellDarcy J Nolet  is a 60 y.o. female  with coronary artery disease, coronary angioplasty to the diagonal 1 branch of the LAD in July 2012. Cardiac catheterization on 01/21/2012 revealing widely patent diagonal stent with mild luminal irregularity in the LAD.  Medical history significant for hypertension, hyperlipidemia.  Patient now presents for annual visit. She is presently doing well, she denies any exertional chest pain, shortness of breath, PND or orthopnea.   Patient states that she had GI work-up and had major complications and had to be hospitalized sometime last year, could not find it on epic.  Past Medical History:  Diagnosis Date  . Anxiety   . Cellulitis   . Complication of anesthesia   . Constipation   . Coronary artery disease    Stent 2.75x15 Xience 2011  . Depression   . GERD (gastroesophageal reflux disease)   . Hyperlipidemia   . Hypertension   . Laboratory examination 09/24/2018  . MI (mitral incompetence)   . Paresthesia 09/24/2018    Past Surgical History:  Procedure Laterality Date  . ABDOMINAL HYSTERECTOMY  [facility]  . COLONOSCOPY WITH ESOPHAGOGASTRODUODENOSCOPY (EGD)     01/05/2019  . ESOPHAGOGASTRODUODENOSCOPY N/A 10/07/2012   Procedure: ESOPHAGOGASTRODUODENOSCOPY (EGD);  Surgeon: Willis ModenaWilliam Outlaw, MD;  Location[facility];  Service: Endoscopy;  Laterality: N/A;  . HERNIA REPAIR    . lap for adhesions    . LEFT HEART CATHETERIZATION WITH CORONARY ANGIOGRAM N/A 01/21/2012   Procedure: LEFT HEART CATHETERIZATION WITH CORONARY ANGIOGRAM;  Surgeon: Pamella PertJagadeesh R Kristen Fromm, MD;  [facility];  Service: Cardiovascular;  Laterality: N/A;  . OOPHORECTOMY    . SHOULDER ARTHROSCOPY Left 11/25/2019   Procedure:  SHOULDER ARTHROSCOPY WITH DEBRIDEMENT EXTENSIVE WITH LYSIS OF ADHESIONS;  Surgeon: Bjorn PippinVarkey, Dax T, MD;  Location: [facility];  Service: Orthopedics;  Laterality: Left;  . SHOULDER CLOSED REDUCTION Left 11/25/2019   Procedure: CLOSED MANIPULATION SHOULDER;  Surgeon: Bjorn PippinVarkey, Dax T, MD;  Location: [facility];  Service: Orthopedics;  Laterality: Left;   Social History   Tobacco Use  . Smoking status: Never Smoker  . Smokeless tobacco: Never Used  Substance Use Topics  . Alcohol use: No  Marital Status: Married   Current Outpatient Medications on File Prior to Visit  Medication Sig Dispense Refill  . ALPRAZolam (XANAX) 1 MG tablet TAKE ONE TABLET BY MOUTH UP TO 3 TIMES DAILY AS NEEDED FOR ANXIETY 90 tablet 1  . cholecalciferol (VITAMIN D3) 25 MCG (1000 UT) tablet Take 1,000 Units by mouth daily.    . citalopram (CELEXA) 40 MG tablet TAKE 1 TABLET BY MOUTH EVERY DAY 90 tablet 2  . clotrimazole-betamethasone (LOTRISONE) cream Apply 1 application topically 2 (two) times daily. 45 g 1  . ezetimibe (ZETIA) 10 MG tablet TAKE 1 TABLET BY MOUTH EVERY DAY 90 tablet 3  . fluticasone (FLONASE) 50 MCG/ACT nasal spray Place 2 sprays into both nostrils daily. 16 g 6  . hydrochlorothiazide (HYDRODIURIL) 25 MG tablet Take one half tab daily by mouth 45 tablet 3  . lansoprazole (PREVACID) 30 MG capsule Take 30 mg by mouth 1 day or 1 dose.    . methylphenidate (METADATE  ER) 20 MG ER tablet TAKE ONE TABLET BY MOUTH EACH MORNING AND REPEAT ONCE AT NOON DAILY AS DIRECTED 180 tablet 0  . metoprolol tartrate (LOPRESSOR) 25 MG tablet TAKE 1 TABLET BY MOUTH EVERY DAY 90 tablet 3  . montelukast (SINGULAIR) 10 MG tablet Take 1 tablet (10 mg total) by mouth at bedtime. 90 tablet 3  . nitroGLYCERIN (NITROSTAT) 0.4 MG SL tablet Place 1 tablet (0.4 mg total) under the tongue every 5 (five) minutes x 3 doses as needed for chest pain. 30 tablet 3  . olmesartan (BENICAR) 40 MG tablet TAKE 1 TABLET BY  MOUTH EVERY DAY 90 tablet 3  . PATADAY 0.2 % SOLN INSTILL ONE DROP INTO THE AFFECTED EYE(S) ONCE DAILY AS DIRECTED (Patient taking differently: 1 drop See admin instructions. In affected eye as needed for allergic conjuctvitis) 2.5 mL 12  . rosuvastatin (CRESTOR) 20 MG tablet Take 1 tablet (20 mg total) by mouth daily. 90 tablet 3  . terconazole (TERAZOL 7) 0.4 % vaginal cream Place 1 applicator vaginally at bedtime. 45 g 0   No current facility-administered medications on file prior to visit.   Review of Systems  Respiratory: Negative for shortness of breath.   Cardiovascular: Negative for chest pain, palpitations and claudication.   Objective:  Blood pressure (!) 112/54, pulse 61, temperature 98.2 F (36.8 C), temperature source Temporal, resp. rate 17, height 5\' 4"  (1.626 m), weight 191 lb 9.6 oz (86.9 kg), SpO2 96 %. Body mass index is 32.89 kg/m.   Vitals with BMI 10/09/2020 09/29/2020 03/29/2020  Height 5\' 4"  5\' 4"  5\' 4"   Weight 191 lbs 10 oz 192 lbs 195 lbs 6 oz  BMI 32.87 32.94 33.52  Systolic 112 118 03/31/2020  Diastolic 54 60 72  Pulse 61 65 61    Physical Exam Constitutional:      General: She is not in acute distress.    Appearance: She is well-developed.     Comments: Mildly obese  Neck:     Thyroid: No thyromegaly.     Vascular: No JVD.  Cardiovascular:     Rate and Rhythm: Normal rate and regular rhythm.     Pulses: Intact distal pulses.     Heart sounds: Normal heart sounds. No murmur heard. No gallop.   Pulmonary:     Effort: Pulmonary effort is normal.     Breath sounds: Normal breath sounds.  Abdominal:     General: Bowel sounds are normal.     Palpations: Abdomen is soft.  Musculoskeletal:        General: Normal range of motion.     Cervical back: Neck supple.  Skin:    General: Skin is warm and dry.  Neurological:     Mental Status: She is alert.    CMP Latest Ref Rng & Units 09/29/2020 03/29/2020 11/22/2019  Glucose 65 - 99 mg/dL 86 92 97  BUN 8 - 27  mg/dL 21 14 18   Creatinine 0.57 - 1.00 mg/dL 696) 10/01/2020 03/31/2020  Sodium 134 - 144 mmol/L 141 139 138  Potassium 3.5 - 5.2 mmol/L 4.5 4.3 4.9  Chloride 96 - 106 mmol/L 102 101 103  CO2 20 - 29 mmol/L 23 24 27   Calcium 8.7 - 10.3 mg/dL 10.7(H) 10.0 10.3  Total Protein 6.0 - 8.5 g/dL - 6.8 -  Total Bilirubin 0.0 - 1.2 mg/dL - 0.5 -  Alkaline Phos 48 - 121 IU/L - 62 -  AST 0 - 40 IU/L - 22 -  ALT 0 - 32 IU/L - 26 -   CBC Latest Ref Rng & Units 01/13/2019 01/07/2019 01/06/2019  WBC 3.4 - 10.8 x10E3/uL 7.6 6.5 11.3(H)  Hemoglobin 11.1 - 15.9 g/dL 80.9 11.5(L) 11.5(L)  Hematocrit 34.0 - 46.6 % 36.2 36.3 36.2  Platelets 150 - 450 x10E3/uL 274 219 232   Lipid Panel Recent Labs    03/29/20 1139 09/29/20 1419  CHOL  --  171  TRIG  --  217*  LDLCALC  --  76  HDL  --  59  CHOLHDL  --  2.9  LDLDIRECT 64  --     HEMOGLOBIN A1C Lab Results  Component Value Date   HGBA1C 5.5 09/29/2020   MPG 120 (H) 01/17/2012   TSH Recent Labs    09/29/20 1419  TSH 3.190     CARDIAC STUDIES:   Coronary angiogram 01/21/12:  LAD: LAD gives origin to a large diagonal-1. Diagonal one is very large and he is equivalent to the LAD. LAD has mild luminal irregularities. There is a stent (July 2012 2.75x15 mm Xience stent) in the proximal portion of the D1 which is widely patent. The ostium of the D1 and the LAD just after the D1 origin show 20% stenoses. Mid LAD shows mild luminal irregularity. EF 60%  EKG:    EKG 10/09/2020: Marked sinus bradycardia at rate of 53 bpm, normal axis, nonspecific anterior T wave abnormality.  Borderline low voltage complexes. No significant change from  EKG 09/24/2018: Sinus bradycardia at rate of 53 bpm, normal axis.  Nonspecific diffuse T abnormality.  Assessment & Recommendations:     ICD-10-CM   1. Coronary artery disease involving native coronary artery of native heart without angina pectoris  I25.10 icosapent Ethyl (VASCEPA) 1 g capsule  2. Primary hypertension  I10  EKG 12-Lead  3. Hypercholesteremia  E78.00 icosapent Ethyl (VASCEPA) 1 g capsule    Lipid Panel With LDL/HDL Ratio    Lipid Panel With LDL/HDL Ratio    Meds ordered this encounter  Medications  . icosapent Ethyl (VASCEPA) 1 g capsule    Sig: Take 2 capsules (2 g total) by mouth daily after supper.    Dispense:  180 capsule    Refill:  3   Medications Discontinued During This Encounter  Medication Reason  . fluconazole (DIFLUCAN) 100 MG tablet Completed Course   Orders Placed This Encounter  Procedures  . Lipid Panel With LDL/HDL Ratio    Standing Status:   Future    Number of Occurrences:   1    Standing Expiration Date:   10/09/2021  . EKG 12-Lead    Recommendation:  SPARKLES MCNEELY is a 60 y.o. with coronary artery disease, coronary angioplasty to the diagonal 1 branch of the LAD in July 2012. Cardiac catheterization on 01/21/2012 revealing widely patent diagonal stent with mild luminal irregularity in the LAD.  Medical history significant for hypertension, hyperlipidemia.  Patient is here on a annual follow-up visit of coronary artery disease, fortunately she has not had any further episodes of angina pectoris, hair loss is improved since discontinuing Lipitor and switching to Crestor along with Zetia, review of lipids reveal elevated triglycerides, will start her on Vascepa 2 g in the evening after dinner, recheck lipids in a month to 6 weeks.  Blood pressure is well controlled.  No change in EKG from last year and also no change in physical exam.  Weight loss discussed.  Patient states that she had GI work-up and during the procedure  had "cardiac complication" and was hospitalized but I do not see any records in epic.  I'll see her back in a year.   Yates Decamp, MD, [facility] 10/09/2020, 12:54 PM Office: [phone] Pager: [phone]

## 2020-10-24 ENCOUNTER — Ambulatory Visit: Payer: Self-pay | Admitting: Surgery

## 2020-10-24 ENCOUNTER — Encounter: Payer: Self-pay | Admitting: Surgery

## 2020-10-24 DIAGNOSIS — R112 Nausea with vomiting, unspecified: Secondary | ICD-10-CM

## 2020-10-24 DIAGNOSIS — K644 Residual hemorrhoidal skin tags: Secondary | ICD-10-CM | POA: Insufficient documentation

## 2020-10-24 DIAGNOSIS — Z9889 Other specified postprocedural states: Secondary | ICD-10-CM

## 2020-10-24 DIAGNOSIS — Z9189 Other specified personal risk factors, not elsewhere classified: Secondary | ICD-10-CM

## 2020-10-24 DIAGNOSIS — K642 Third degree hemorrhoids: Secondary | ICD-10-CM

## 2020-10-24 DIAGNOSIS — K641 Second degree hemorrhoids: Secondary | ICD-10-CM | POA: Diagnosis not present

## 2020-10-24 HISTORY — DX: Other specified postprocedural states: Z98.890

## 2020-10-24 HISTORY — DX: Nausea with vomiting, unspecified: R11.2

## 2020-10-24 NOTE — H&P (Signed)
Miranda Baker Appointment: 10/24/2020 10:15 AM Location: [facility] Patient #: [id number] DOB: February 24, 1961 Married / Language: Miranda Baker / Race: White Female  History of Present Illness Miranda Sportsman MD; 10/24/2020 12:22 PM) The patient is a 60 year old female who presents with hemorrhoids. Note for "Hemorrhoids": ` ` ` Patient sent for surgical consultation at the request of Miranda Baker, PCP  Chief Complaint: Annoying hemorrhoids. ` ` The patient is a woman with complaints of hemorrhoids. Discussed with primary care physician. Surgical consultation offered. Patient had a underwhelming normal colonoscopy in 2020 by Dr. Dulce Sellar with [facility] gastroenterology. Known hiatal hernia. She had significant postprocedural epigastric pain and fever. She was admitted for observation. Workup negative for perforation or any cardiac abnormalities. Symptoms resolved and she was discharged the following day.  Patient notes that she feels hemorrhoids on the outside that are irritating. Gets burning with a rash. Usually moves her bowels once a morning. Can walk a half hour without difficulty. No smoking. No diabetes. No blood thinners. Sleep apnea. Father with history of colon polyps gets colonoscopies rather regularly. Prior abdominal surgery Miranda Baker for pelvic GYN issues including lysing adhesions and a hernia repair in her lower abdomen.  Patient denies any incontinence to flatus or stools. No major leaking or sleeping. No sharp pain with defecation although that has happened in the past. It's more irritating. She wondered if she had a yeast infection. Placed on antifungal therapy. That seemed to resolve vaginal complaints but perianal symptoms persisted. Surgical consultation offered.  (Review of systems as stated in this history (HPI) or in the review of systems. Otherwise all other 12 point ROS are negative) ` ` ###########################################`  This patient  encounter took 30 minutes today to perform the following: obtain history, perform exam, review outside records, interpret tests & imaging, counsel the patient on their diagnosis; and, document this encounter, including findings & plan in the electronic health record (EHR).   Allergies Marliss Coots, CNA; 10/24/2020 10:13 AM) Penicillin V *PENICILLINS* Morphine Sulfate (PF) *ANALGESICS - OPIOID* Propofol *GENERAL ANESTHETICS* Bactrim *ANTI-INFECTIVE AGENTS - MISC.* Erythromycin *DERMATOLOGICALS* FHP Tetracycline *Tetracyclines Vibramycin *TETRACYCLINES* Allergies Reconciled  Medication History (Donyelle Alston, CNA; 10/24/2020 10:09 AM) Vascepa (1GM Capsule, Oral) Active. Methylphenidate HCl ER (20MG  Tablet ER, Oral) Active. oxyCODONE HCl (5MG  Tablet, Oral) Active. ALPRAZolam (1MG  Tablet, Oral) Active. traMADol HCl (50MG  Tablet, Oral) Active. Azithromycin (250MG  Tablet, Oral) Active. Celecoxib (100MG  Capsule, Oral) Active. Citalopram Hydrobromide (40MG  Tablet, Oral) Active. Clotrimazole-Betamethasone (1-0.05% Cream, External) Active. [facility] Acetaminophen Ex St (500MG  Tablet, Oral) Active. Ezetimibe (10MG  Tablet, Oral) Active. Fluconazole (100MG  Tablet, Oral) Active. Fluticasone Propionate (50MCG/ACT Suspension, Nasal) Active. hydroCHLOROthiazide (25MG  Tablet, Oral) Active. Lansoprazole (30MG  Capsule DR, Oral) Active. methylPREDNISolone (4MG  Tab Ther Pack, Oral) Active. Metoprolol Tartrate (25MG  Tablet, Oral) Active. Montelukast Sodium (10MG  Tablet, Oral) Active. Olmesartan Medoxomil (40MG  Tablet, Oral) Active. Rosuvastatin Calcium (20MG  Tablet, Oral) Active. Terconazole (0.4% Cream, Vaginal) Active. Medications Reconciled    Vitals (Donyelle Alston CNA; 10/24/2020 10:10 AM) 10/24/2020 10:09 AM Weight: 192.13 lb Height: 64in Body Surface Area: 1.92 m Body Mass Index: 32.98 kg/m  Temp.: 56F  Pulse: 87 (Regular)  P.OX: 98% (Room  air) BP: 100/60(Sitting, Left Arm, Standard)        Physical Exam MD; 10/24/2020 10:26 AM)  General Mental Status-Alert. General Appearance-Not in acute distress, Not Sickly. Orientation-Oriented X3. Hydration-Well hydrated. Voice-Normal.  Integumentary Global Assessment Upon inspection and palpation of skin surfaces of the - Axillae: non-tender, no inflammation or ulceration, no drainage.  and Distribution of scalp and body hair is normal. General Characteristics Temperature - normal warmth is noted.  Head and Neck Head-normocephalic, atraumatic with no lesions or palpable masses. Face Global Assessment - atraumatic, no absence of expression. Neck Global Assessment - no abnormal movements, no bruit auscultated on the right, no bruit auscultated on the left, no decreased range of motion, non-tender. Trachea-midline. Thyroid Gland Characteristics - non-tender.  Eye Eyeball - Left-Extraocular movements intact, No Nystagmus - Left. Eyeball - Right-Extraocular movements intact, No Nystagmus - Right. Cornea - Left-No Hazy - Left. Cornea - Right-No Hazy - Right. Sclera/Conjunctiva - Left-No scleral icterus, No Discharge - Left. Sclera/Conjunctiva - Right-No scleral icterus, No Discharge - Right. Pupil - Left-Direct reaction to light normal. Pupil - Right-Direct reaction to light normal.  ENMT Ears Pinna - Left - no drainage observed, no generalized tenderness observed. Pinna - Right - no drainage observed, no generalized tenderness observed. Nose and Sinuses External Inspection of the Nose - no destructive lesion observed. Inspection of the nares - Left - quiet respiration. Inspection of the nares - Right - quiet respiration. Mouth and Throat Lips - Upper Lip - no fissures observed, no pallor noted. Lower Lip - no fissures observed, no pallor noted. Nasopharynx - no discharge present. Oral Cavity/Oropharynx - Tongue - no dryness  observed. Oral Mucosa - no cyanosis observed. Hypopharynx - no evidence of airway distress observed.  Chest and Lung Exam Inspection Movements - Normal and Symmetrical. Accessory muscles - No use of accessory muscles in breathing. Palpation Palpation of the chest reveals - Non-tender. Auscultation Breath sounds - Normal and Clear.  Cardiovascular Auscultation Rhythm - Regular. Murmurs & Other Heart Sounds - Auscultation of the heart reveals - No Murmurs and No Systolic Clicks.  Abdomen Inspection Inspection of the abdomen reveals - No Visible peristalsis and No Abnormal pulsations. Umbilicus - No Bleeding, No Urine drainage. Palpation/Percussion Palpation and Percussion of the abdomen reveal - Soft, Non Tender, No Rebound tenderness, No Rigidity (guarding) and No Cutaneous hyperesthesia. Note: Abdomen obese but soft. . Not severely distended. No distasis recti. No umbilical or other anterior abdominal wall hernias  Female Genitourinary Sexual Maturity Tanner 5 - Adult hair pattern. Note: Pfannenstiel incision healed without any hernia. No inguinal hernias or lymphadenopathy. Normal external female genitalia. No vaginal bleeding nor discharge  Rectal Note: Perianal skin with irritated external hemorrhoids especially right lateral and left posterior. Some mild pruritus irritation.  Perhaps evidence of an old fissure but no sentinel tag nor fissure now. Normal sphincter tone. No abscess nor fistula. Tolerates digital and anoscopic exam. Right posterior internal hemorrhoid at least grade 2, possibly grade 3. Other hemorrhoidal piles grade 1-2. No proctitis. Vaginal septum but no major rectocele. No condyloma warts. No pilonidal disease.  Examination done with female medical assistant in room  Peripheral Vascular Upper Extremity Inspection - Left - No Cyanotic nailbeds - Left, Not Ischemic. Inspection - Right - No Cyanotic nailbeds - Right, Not Ischemic.  Neurologic Neurologic  evaluation reveals -normal attention span and ability to concentrate, able to name objects and repeat phrases. Appropriate fund of knowledge , normal sensation and normal coordination. Mental Status Affect - not angry, not paranoid. Cranial Nerves-Normal Bilaterally. Gait-Normal.  Neuropsychiatric Mental status exam performed with findings of-able to articulate well with normal speech/language, rate, volume and coherence, thought content normal with ability to perform basic computations and apply abstract reasoning and no evidence of hallucinations, delusions, obsessions or homicidal/suicidal ideation.  Musculoskeletal Global Assessment Spine, Ribs and Pelvis -  no instability, subluxation or laxity. Right Upper Extremity - no instability, subluxation or laxity.  Lymphatic Head & Neck  General Head & Neck Lymphatics: Bilateral - Description - No Localized lymphadenopathy. Axillary  General Axillary Region: Bilateral - Description - No Localized lymphadenopathy. Femoral & Inguinal  Generalized Femoral & Inguinal Lymphatics: Left - Description - No Localized lymphadenopathy. Right - Description - No Localized lymphadenopathy.    Assessment & Plan Miranda Baker(Remedy Corporan C. Hadyn Azer MD; 10/24/2020 12:23 PM)  EXTERNAL HEMORRHOIDS WITH COMPLICATION (K64.4) Impression: External hemorrhoids with irritation pain and bleeding.  These will not go away without surgery. Recommended outpatient surgery with internal hemorrhoid ligation and pexing excision of any persistent prolapsing external tissue.  She wishes to have something done but his concerns and she's had problems with propofol and anesthesia. Bad postoperative nausea and vomiting.Miranda Baker. We'll have cardiology double check there are no issues and ask anesthesia to see preoperatively to have a plan to minimize any perioperative concerns.  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 6 weeks before  unrestricted.  Current Plans The anatomy & physiology of the anorectal region was discussed. The pathophysiology of hemorrhoids and differential diagnosis was discussed. Natural history risks without surgery was discussed. I stressed the importance of a bowel regimen to have daily soft bowel movements to minimize progression of disease. Interventions such as sclerotherapy & banding were discussed.  The patient's symptoms are not adequately controlled by medicines and other non-operative treatments. I feel the risks & problems of no surgery outweigh the operative risks; therefore, I recommended surgery to treat the hemorrhoids by ligation, pexy, and possible resection.  Risks such as bleeding, infection, urinary difficulties, need for further treatment, heart attack, death, and other risks were discussed. I noted a good likelihood this will help address the problem. Goals of post-operative recovery were discussed as well. Possibility that this will not correct all symptoms was explained. Post-operative pain, bleeding, constipation, and other problems after surgery were discussed. We will work to minimize complications. Educational handouts further explaining the pathology, treatment options, and bowel regimen were given as well. Questions were answered. The patient expresses understanding & wishes to proceed with surgery.   PROLAPSED INTERNAL HEMORRHOIDS, GRADE 2 (K64.1) Impression: The anatomy & physiology of the anorectal region was discussed. The pathophysiology of hemorrhoids and differential diagnosis was discussed. Natural history progression was discussed. I stressed the importance of a bowel regimen to have daily soft bowel movements to minimize progression of disease. Goal of one BM / day ideal. Use of wet wipes, warm baths, avoiding straining, etc were emphasized.  Educational handouts further explaining the pathology, treatment options, and bowel regimen were given as well.  The patient expressed understanding.   PROLAPSED INTERNAL HEMORRHOIDS, GRADE 3 (K64.2)  Current Plans Pt Education - [facility] Hemorrhoids (Miranda Baker): discussed with patient and provided information. Pt Education - Pamphlet Given - The Hemorrhoid Book: discussed with patient and provided information.  ENCOUNTER FOR PREOPERATIVE EXAMINATION FOR GENERAL SURGICAL PROCEDURE (Z01.818)  Current Plans You are being scheduled for surgery- Our schedulers will call you.  You should hear from our office's scheduling department within 5 working days about the location, date, and time of surgery. We try to make accommodations for patient's preferences in scheduling surgery, but sometimes the OR schedule or the surgeon's schedule prevents us from making those accommodations.  If you have not heard from our office [phone]) in 5 working days, call the office and ask  for your surgeon's nurse.  If you have other questions about your diagnosis, plan, or surgery, call the office and ask for your surgeon's nurse.  Pt Education - [facility] Rectal Prep for Anorectal outpatient/office surgery: discussed with patient and provided information. Pt Education - [facility] Rectal Surgery HCI (Miranda Baker): discussed with patient and provided information. Pt Education - [facility] Good Bowel Health (Miranda Baker)  PONV (POSTOPERATIVE NAUSEA AND VOMITING) (R11.2) Impression: Episodes of postoperative nausea and vomiting that she thinks is propofol related. Also some epigastric pain after endoscopy of uncertain etiology with some tachycardia.  She recalls her cardiologist telling her to discuss with him before she considers any more surgeries. He just saw her 2 weeks ago did not seem severely concerned since she was asymptomatic now. Probably wise to see if anesthesia can touch base with her so they can develop a regimen to minimize any nausea or other issues as I do need general anesthesia for this   CAD (CORONARY ARTERY DISEASE) (I25.10) Story:  coronary artery disease, coronary angioplasty to the diagonal 1 branch of the LAD in July 2012. Cardiac catheterization on 01/21/2012 revealing widely patent diagonal stent with mild luminal irregularity in the LAD. Medical history significant for hypertension, hyperlipidemia. Impression: Rule out history of coronary disease. We'll double check with her cardiologist he does not have any further concerns. She has good exercise tolerance and does not smoke with cholesterol control.  Miranda Sportsman, MD, FACS, MASCRS  Gastrointestinal and Minimally Invasive Surgery  [facility] [address] [place], [place] [address] [phone] Fax [phone] Main/Paging  CONTACT INFORMATION: Weekday (9AM-5PM) concerns: Call [facility] main office at [phone] Weeknight (5PM-9AM) or Weekend/Holiday concerns: Check [email or web address] for General Surgery [facility] coverage (Please, do not use SecureChat as it is not reliable communication to operating surgeons for immediate patient care)

## 2020-12-04 ENCOUNTER — Telehealth: Payer: Self-pay | Admitting: Family Medicine

## 2020-12-04 MED ORDER — AZITHROMYCIN 250 MG PO TABS: ORAL_TABLET | ORAL | 1 refills | Status: DC

## 2020-12-04 MED ORDER — PREDNISONE 50 MG PO TABS: ORAL_TABLET | ORAL | 1 refills | Status: DC

## 2020-12-04 NOTE — Telephone Encounter (Signed)
2 weeks sinus congestion, low grade temp, generla malaise. Feels like her typical sinus infection . PMH sinus surgery and recurrent infections. Abx and steroids. She will call if not totally resolved in 10 days ior if she has worsening of sx or new sx. Miranda Baker

## 2020-12-12 ENCOUNTER — Encounter: Payer: Self-pay | Admitting: Family Medicine

## 2020-12-12 ENCOUNTER — Ambulatory Visit: Payer: BC Managed Care – PPO | Admitting: Family Medicine

## 2020-12-12 ENCOUNTER — Other Ambulatory Visit: Payer: Self-pay

## 2020-12-12 VITALS — BP 132/76 | HR 70

## 2020-12-12 DIAGNOSIS — I2510 Atherosclerotic heart disease of native coronary artery without angina pectoris: Secondary | ICD-10-CM | POA: Diagnosis not present

## 2020-12-12 DIAGNOSIS — R079 Chest pain, unspecified: Secondary | ICD-10-CM

## 2020-12-12 DIAGNOSIS — K219 Gastro-esophageal reflux disease without esophagitis: Secondary | ICD-10-CM

## 2020-12-12 MED ORDER — LANSOPRAZOLE 30 MG PO CPDR: DELAYED_RELEASE_CAPSULE | ORAL | 0 refills | Status: DC

## 2020-12-12 NOTE — Assessment & Plan Note (Signed)
Trial of visous lidocaine plus mylanta in office completely resolved her symtpoms (which she had just started having earlier this afternoon) almost immediately. Will increase prevacid to BID for one week then return to daily. IF she has any worsening or new sx., she will either let me know or call cardiology. Given the immediate relief from the GI cocktail, and the fact she is on steroids and abx which are likely increasing her GERD, I think this is pretty likely to be GI rather than cardiac. She does however have hx of PTCA thus the precautions.

## 2020-12-12 NOTE — Progress Notes (Addendum)
    CHIEF COMPLAINT / HPI:  complains of 'bronchial spasm" for last several days. Has been on the steroids and antibiotics and has one more day of steroids for her sinus infection. That is better but her head still feels stuffed up. No wheezing. Cough continues, it is non productive and improving in frequwncy. No SOB.   The "spasm" occur intermittently and self resolve after a few minutes. Location is in center of her her chest /epigastric area. Does not radiate. Feels like 'my bronchial tubes" are spasming. She says it does not feel like it is her heart/   PERTINENT  PMH / PSH: I have reviewed the patient's medications, allergies, past medical and surgical history, smoking status and updated in the EMR as appropriate. PTCA  OBJECTIVE:  There were no vitals taken for this visit.  Marland Kitchen GENERAL: Well-developed, well-nourished, no acute distress. CARDIOVASCULAR: Regular rate and rhythm no murmur gallop or rub LUNGS: Clear to auscultation bilaterally, no rales or wheeze. ABDOMEN: Soft positive bowel sounds no rebound or guarding NEURO: No gross focal neurological deficits. MSK: Movement of extremity x 4. CHEST; no tenderness to palpation over anterior chest. No skeletal abnormalities. Area she points to for her "spasms" is mid epigastric up through xyphoid area to mid line right below xyphoid.  NECK  FROM supple. No JVD. No LAD. No TM  ASSESSMENT / PLAN: 1. Chest pain 2. Hx of PTCA 3. HTN 4. Known CAD 5. Hx severe GERD  After careful review of symptoms and her exam, I suspect this is most likely GI related.  GERD, SEVERE Trial of visous lidocaine plus mylanta in office completely resolved her symtpoms (which she had just started having earlier this afternoon) almost immediately. Will increase prevacid to BID for one week then return to daily. IF she has any worsening or new sx., she will either let me know or call cardiology. Given the immediate relief from the GI cocktail, and the fact  she is on steroids and abx which are likely increasing her GERD, I think this is pretty likely to be GI rather than cardiac. She does however have hx of PTCA thus the precautions.   Denny Levy MD

## 2020-12-20 ENCOUNTER — Encounter: Payer: Self-pay | Admitting: Student

## 2020-12-20 ENCOUNTER — Other Ambulatory Visit: Payer: Self-pay

## 2020-12-20 ENCOUNTER — Ambulatory Visit: Payer: BC Managed Care – PPO | Admitting: Student

## 2020-12-20 VITALS — BP 98/64 | HR 75 | Temp 98.1°F | Resp 16 | Ht 64.0 in | Wt 191.6 lb

## 2020-12-20 DIAGNOSIS — R072 Precordial pain: Secondary | ICD-10-CM

## 2020-12-20 DIAGNOSIS — R079 Chest pain, unspecified: Secondary | ICD-10-CM | POA: Diagnosis not present

## 2020-12-20 DIAGNOSIS — R0609 Other forms of dyspnea: Secondary | ICD-10-CM | POA: Diagnosis not present

## 2020-12-20 DIAGNOSIS — R0600 Dyspnea, unspecified: Secondary | ICD-10-CM

## 2020-12-20 DIAGNOSIS — I2510 Atherosclerotic heart disease of native coronary artery without angina pectoris: Secondary | ICD-10-CM

## 2020-12-20 DIAGNOSIS — I10 Essential (primary) hypertension: Secondary | ICD-10-CM

## 2020-12-20 NOTE — Progress Notes (Signed)
Subjective:  Primary Physician:  Nestor RampNeal, Sara L, MD  Patient ID: Miranda Baker, female    DOB: 10/15/1960, 60 y.o.   MRN: [id number]  Chief Complaint  Patient presents with  . Chest Pain  . Shortness of Breath  . Follow-up    HPI: Miranda CampbellDarcy J Baker  is a 60 y.o. female  with coronary artery disease, coronary angioplasty to the diagonal 1 branch of the LAD in July 2012. Cardiac catheterization on 01/21/2012 revealing widely patent diagonal stent with mild luminal irregularity in the LAD.  Medical history significant for hypertension, hyperlipidemia.  Patient presents for urgent visit with complaints of chest pain and shortness of breath.  She was seen by PCP on 12/12/2020 with similar complaints.  They gave her trial of viscous lidocaine and Mylanta in the office which relieved her symptoms, therefore suspected chest pain was related to underlying GERD exacerbated by treatment with steroids and antibiotics.  She now presents to our office for other evaluation.  At last visit started her on Vascepa, which she is tolerating well.   Patient reports 2 weeks ago she began having episodes of intermittent chest pain both at rest and with exertion lasting 1 to 5 minutes with no associated symptoms.  Denies palpitations, syncope, near syncope, dizziness.  Denies orthopnea, PND, leg swelling.  Patient was seen by her PCP who suspected symptoms related to GERD, however PPI treatment has not improved patient's symptoms.  Patient states that she had GI work-up and had major complications and had to be hospitalized sometime last year, could not find it on epic.  Past Medical History:  Diagnosis Date  . Anxiety   . Cellulitis   . Complication of anesthesia   . Constipation   . Coronary artery disease    Stent 2.75x15 Xience 2011  . Depression   . GERD (gastroesophageal reflux disease)   . Hyperlipidemia   . Hypertension   . Laboratory examination 09/24/2018  . MI (mitral incompetence)   . Paresthesia  09/24/2018  . PONV (postoperative nausea and vomiting) 10/24/2020    Past Surgical History:  Procedure Laterality Date  . ABDOMINAL HYSTERECTOMY  [facility]  . COLONOSCOPY WITH ESOPHAGOGASTRODUODENOSCOPY (EGD)     01/05/2019  . ESOPHAGOGASTRODUODENOSCOPY N/A 10/07/2012   Procedure: ESOPHAGOGASTRODUODENOSCOPY (EGD);  Surgeon: Willis ModenaWilliam Outlaw, MD;  Location[facility];  Service: Endoscopy;  Laterality: N/A;  . HERNIA REPAIR    . lap for adhesions    . LEFT HEART CATHETERIZATION WITH CORONARY ANGIOGRAM N/A 01/21/2012   Procedure: LEFT HEART CATHETERIZATION WITH CORONARY ANGIOGRAM;  Surgeon: Pamella PertJagadeesh R Ganji, MD;  [facility];  Service: Cardiovascular;  Laterality: N/A;  . OOPHORECTOMY    . SHOULDER ARTHROSCOPY Left 11/25/2019   Procedure: SHOULDER ARTHROSCOPY WITH DEBRIDEMENT EXTENSIVE WITH LYSIS OF ADHESIONS;  Surgeon: Bjorn PippinVarkey, Dax T, MD;  Location: [facility];  Service: Orthopedics;  Laterality: Left;  . SHOULDER CLOSED REDUCTION Left 11/25/2019   Procedure: CLOSED MANIPULATION SHOULDER;  Surgeon: Bjorn PippinVarkey, Dax T, MD;  Location: [facility];  Service: Orthopedics;  Laterality: Left;   Family History  Problem Relation Age of Onset  . Hypertension Mother   . Heart disease Father        3 Stent placement  . Prostate cancer Father   . Hypertension Father   . Heart disease Brother   . Hypertension Brother   . Hyperlipidemia Brother     Social History   Tobacco Use  . Smoking status: Never Smoker  .  Smokeless tobacco: Never Used  Substance Use Topics  . Alcohol use: No  Marital Status: Married    Allergies and medications   Allergies  Allergen Reactions  . Penicillins Anaphylaxis  . Morphine And Related Nausea And Vomiting and Other (See Comments)  . Propofol     Cardiac dysrhthmias after EGD  . Bactrim [Sulfamethoxazole-Trimethoprim] Nausea Only    Intolerance only  . Erythromycin Nausea And Vomiting and Rash  . Tetracyclines & Related Nausea  And Vomiting and Rash  . Vibramycin [Doxycycline Calcium] Nausea And Vomiting and Rash    Current Outpatient Medications on File Prior to Visit  Medication Sig Dispense Refill  . cholecalciferol (VITAMIN D3) 25 MCG (1000 UT) tablet Take 1,000 Units by mouth daily.    . citalopram (CELEXA) 40 MG tablet TAKE 1 TABLET BY MOUTH EVERY DAY 90 tablet 2  . clotrimazole-betamethasone (LOTRISONE) cream Apply 1 application topically 2 (two) times daily. 45 g 1  . ezetimibe (ZETIA) 10 MG tablet TAKE 1 TABLET BY MOUTH EVERY DAY 90 tablet 3  . lansoprazole (PREVACID) 30 MG capsule Take one bid for a week then start one daily by mouth (Patient taking differently: Take 30 mg by mouth 2 (two) times daily before a meal.) 37 capsule 0  . methylphenidate (METADATE ER) 20 MG ER tablet TAKE ONE TABLET BY MOUTH EACH MORNING AND REPEAT ONCE AT NOON DAILY AS DIRECTED 180 tablet 0  . metoprolol tartrate (LOPRESSOR) 25 MG tablet TAKE 1 TABLET BY MOUTH EVERY DAY 90 tablet 3  . montelukast (SINGULAIR) 10 MG tablet Take 1 tablet (10 mg total) by mouth at bedtime. 90 tablet 3  . nitroGLYCERIN (NITROSTAT) 0.4 MG SL tablet Place 1 tablet (0.4 mg total) under the tongue every 5 (five) minutes x 3 doses as needed for chest pain. 30 tablet 3  . olmesartan (BENICAR) 40 MG tablet TAKE 1 TABLET BY MOUTH EVERY DAY 90 tablet 3  . PATADAY 0.2 % SOLN INSTILL ONE DROP INTO THE AFFECTED EYE(S) ONCE DAILY AS DIRECTED (Patient taking differently: 1 drop See admin instructions. In affected eye as needed for allergic conjuctvitis) 2.5 mL 12  . rosuvastatin (CRESTOR) 20 MG tablet Take 1 tablet (20 mg total) by mouth daily. 90 tablet 3  . terconazole (TERAZOL 7) 0.4 % vaginal cream Place 1 applicator vaginally at bedtime. 45 g 0  . ALPRAZolam (XANAX) 1 MG tablet TAKE ONE TABLET BY MOUTH UP TO 3 TIMES DAILY AS NEEDED FOR ANXIETY 90 tablet 1  . icosapent Ethyl (VASCEPA) 1 g capsule Take 2 capsules (2 g total) by mouth daily after supper. 180  capsule 3   No current facility-administered medications on file prior to visit.    ROS:   Review of Systems  Respiratory: Positive for shortness of breath (on exertion).   Cardiovascular: Positive for chest pain. Negative for palpitations, orthopnea, claudication, leg swelling and PND.  Neurological: Negative for dizziness.   Objective:  Blood pressure 98/64, pulse 75, temperature 98.1 F (36.7 C), temperature source Temporal, resp. rate 16, height 5\' 4"  (1.626 m), weight 191 lb 9.6 oz (86.9 kg), SpO2 96 %. Body mass index is 32.89 kg/m.   Vitals with BMI 12/20/2020 12/12/2020 10/09/2020  Height 5\' 4"  - 5\' 4"   Weight 191 lbs 10 oz - 191 lbs 10 oz  BMI 32.87 - 32.87  Systolic 98 132 112  Diastolic 64 76 54  Pulse 75 70 61    Orthostatic VS for the past 72 hrs (Last 3  readings):  Orthostatic BP Patient Position BP Location Cuff Size Orthostatic Pulse  12/20/20 1031 105/60 Standing Left Arm Normal 70  12/20/20 1030 109/64 Sitting Left Arm Normal 63  12/20/20 1029 108/60 Supine Left Arm Normal 60    Physical Exam Vitals reviewed.  Constitutional:      General: She is not in acute distress.    Appearance: She is well-developed.     Comments: Mildly obese  HENT:     Head: Normocephalic and atraumatic.  Neck:     Thyroid: No thyromegaly.     Vascular: No JVD.  Cardiovascular:     Rate and Rhythm: Normal rate and regular rhythm.     Pulses: Intact distal pulses.     Heart sounds: Normal heart sounds, S1 normal and S2 normal. No murmur heard. No gallop.   Pulmonary:     Effort: Pulmonary effort is normal. No respiratory distress.     Breath sounds: Normal breath sounds. No wheezing, rhonchi or rales.  Abdominal:     General: Bowel sounds are normal.     Palpations: Abdomen is soft.  Musculoskeletal:        General: Normal range of motion.     Cervical back: Neck supple.     Right lower leg: No edema.     Left lower leg: No edema.  Skin:    General: Skin is warm and  dry.  Neurological:     Mental Status: She is alert.     Laboratory examination::   CMP Latest Ref Rng & Units 09/29/2020 03/29/2020 11/22/2019  Glucose 65 - 99 mg/dL 86 92 97  BUN 8 - 27 mg/dL 21 14 18   Creatinine 0.57 - 1.00 mg/dL ) 7.56(E 3.32  Sodium 134 - 144 mmol/L 141 139 138  Potassium 3.5 - 5.2 mmol/L 4.5 4.3 4.9  Chloride 96 - 106 mmol/L 102 101 103  CO2 20 - 29 mmol/L 23 24 27   Calcium 8.7 - 10.3 mg/dL 10.7(H) 10.0 10.3  Total Protein 6.0 - 8.5 g/dL - 6.8 -  Total Bilirubin 0.0 - 1.2 mg/dL - 0.5 -  Alkaline Phos 48 - 121 IU/L - 62 -  AST 0 - 40 IU/L - 22 -  ALT 0 - 32 IU/L - 26 -   CBC Latest Ref Rng & Units 01/13/2019 01/07/2019 01/06/2019  WBC 3.4 - 10.8 x10E3/uL 7.6 6.5 11.3(H)  Hemoglobin 11.1 - 15.9 g/dL 03/09/2019 11.5(L) 11.5(L)  Hematocrit 34.0 - 46.6 % 36.2 36.3 36.2  Platelets 150 - 450 x10E3/uL 274 219 232   Lipid Panel Recent Labs    03/29/20 1139 09/29/20 1419  CHOL  --  171  TRIG  --  217*  LDLCALC  --  76  HDL  --  59  CHOLHDL  --  2.9  LDLDIRECT 64  --     HEMOGLOBIN A1C Lab Results  Component Value Date   HGBA1C 5.5 09/29/2020   MPG 120 (H) 01/17/2012   TSH Recent Labs    09/29/20 1419  TSH 3.190     Radiology:  No results found.   Cardiac Studies:    Coronary angiogram 01/21/12:  LAD: LAD gives origin to a large diagonal-1. Diagonal one is very large and he is equivalent to the LAD. LAD has mild luminal irregularities. There is a stent (July 2012 2.75x15 mm Xience stent) in the proximal portion of the D1 which is widely patent. The ostium of the D1 and the LAD just after the D1 origin show  20% stenoses. Mid LAD shows mild luminal irregularity. EF 60%   EKG:   EKG 12/20/2020: Sinus bradycardia at a rate of 59 bpm.  Normal axis.  Poor R wave progression, cannot exclude anteroseptal infarct old.  Low voltage complexes.  Diffuse nonspecific T wave abnormality.  Compared to EKG 10/09/2020, no significant change.  Assessment     ICD-10-CM    1. Precordial pain  R07.2 PCV MYOCARDIAL PERFUSION WO LEXISCAN    PCV ECHOCARDIOGRAM COMPLETE  2. Coronary artery disease involving native coronary artery of native heart without angina pectoris  I25.10   3. Primary hypertension  I10   4. Chest pain of uncertain etiology  R07.9 EKG 12-Lead  5. Dyspnea on exertion  R06.00 PCV MYOCARDIAL PERFUSION WO LEXISCAN    PCV ECHOCARDIOGRAM COMPLETE    No orders of the defined types were placed in this encounter.  Medications Discontinued During This Encounter  Medication Reason  . azithromycin (ZITHROMAX Z-PAK) 250 MG tablet Error  . fluticasone (FLONASE) 50 MCG/ACT nasal spray Error  . predniSONE (DELTASONE) 50 MG tablet Error  . hydrochlorothiazide (HYDRODIURIL) 25 MG tablet Side effect (s)   Orders Placed This Encounter  Procedures  . PCV MYOCARDIAL PERFUSION WO LEXISCAN    Standing Status:   Future    Standing Expiration Date:   02/19/2021  . EKG 12-Lead  . PCV ECHOCARDIOGRAM COMPLETE    Standing Status:   Future    Standing Expiration Date:   12/20/2021    Recommendation:    Miranda Baker is a 60 y.o. with coronary artery disease, coronary angioplasty to the diagonal 1 branch of the LAD in July 2012. Cardiac catheterization on 01/21/2012 revealing widely patent diagonal stent with mild luminal irregularity in the LAD.  Medical history significant for hypertension, hyperlipidemia.  Patient presents for urgent visit with complaints of chest pain and shortness of breath.  She was seen by PCP on 12/12/2020 with similar complaints.  They gave her trial of viscous lidocaine and Mylanta in the office which relieved her symptoms, therefore suspected chest pain was related to underlying GERD exacerbated by treatment with steroids and antibiotics.  She now presents to our office for other evaluation.  At last visit started her on Vascepa, which she is tolerating well.   Patient presents with atypical chest pains.  EKG today is unchanged compared to  previous without evidence of ischemia or infarct.  Patient's blood pressure is soft, which may be contributing to symptoms of fatigue, recommend patient discontinue Hydrochlorothiazide.  Patient will continue to monitor blood pressure closely and notify our office if it becomes elevated.  Emergent symptoms of atypical chest pain, will obtain echocardiogram and treadmill nuclear stress test given patient's cardiac history and underlying risk factors.  Counseled patient regarding signs symptoms that would warrant urgent or emergent evaluation, verbalized understanding agreement.  Again discussed with patient regarding diet and lifestyle modifications as well as weight loss in order to reduce cardiovascular risk factors.  Patient is presently on guideline directed medical therapy for coronary artery disease including metoprolol, rosuvastatin, olmesartan.  Also obtain repeat lipid profile testing since initiation of Vascepa.  Follow-up in 4 weeks, sooner if needed, for chest pain and results of cardiac testing.   Rayford Halsted, PA-C 12/20/2020, 4:06 PM Office: [phone]

## 2020-12-24 ENCOUNTER — Other Ambulatory Visit: Payer: Self-pay | Admitting: Family Medicine

## 2021-01-03 ENCOUNTER — Other Ambulatory Visit: Payer: Self-pay

## 2021-01-03 ENCOUNTER — Ambulatory Visit: Payer: BC Managed Care – PPO

## 2021-01-03 DIAGNOSIS — R0609 Other forms of dyspnea: Secondary | ICD-10-CM

## 2021-01-03 DIAGNOSIS — R072 Precordial pain: Secondary | ICD-10-CM | POA: Diagnosis not present

## 2021-01-03 DIAGNOSIS — E7800 Pure hypercholesterolemia, unspecified: Secondary | ICD-10-CM | POA: Diagnosis not present

## 2021-01-03 DIAGNOSIS — R0600 Dyspnea, unspecified: Secondary | ICD-10-CM

## 2021-01-04 ENCOUNTER — Ambulatory Visit: Payer: BC Managed Care – PPO

## 2021-01-04 DIAGNOSIS — R0600 Dyspnea, unspecified: Secondary | ICD-10-CM

## 2021-01-04 DIAGNOSIS — R072 Precordial pain: Secondary | ICD-10-CM

## 2021-01-04 DIAGNOSIS — R0609 Other forms of dyspnea: Secondary | ICD-10-CM

## 2021-01-04 LAB — LIPID PANEL WITH LDL/HDL RATIO
Cholesterol, Total: 150 mg/dL (ref 100–199)
HDL: 62 mg/dL
LDL Chol Calc (NIH): 62 mg/dL (ref 0–99)
LDL/HDL Ratio: 1 ratio (ref 0.0–3.2)
Triglycerides: 153 mg/dL — ABNORMAL HIGH (ref 0–149)
VLDL Cholesterol Cal: 26 mg/dL (ref 5–40)

## 2021-01-15 ENCOUNTER — Encounter: Payer: Self-pay | Admitting: Cardiology

## 2021-01-15 ENCOUNTER — Ambulatory Visit: Payer: BC Managed Care – PPO | Admitting: Cardiology

## 2021-01-15 ENCOUNTER — Other Ambulatory Visit: Payer: Self-pay

## 2021-01-15 VITALS — BP 117/58 | HR 63 | Temp 98.7°F | Resp 16 | Ht 64.0 in | Wt 194.2 lb

## 2021-01-15 DIAGNOSIS — I25118 Atherosclerotic heart disease of native coronary artery with other forms of angina pectoris: Secondary | ICD-10-CM | POA: Diagnosis not present

## 2021-01-15 DIAGNOSIS — E7800 Pure hypercholesterolemia, unspecified: Secondary | ICD-10-CM | POA: Diagnosis not present

## 2021-01-15 DIAGNOSIS — I2510 Atherosclerotic heart disease of native coronary artery without angina pectoris: Secondary | ICD-10-CM | POA: Diagnosis not present

## 2021-01-15 DIAGNOSIS — I10 Essential (primary) hypertension: Secondary | ICD-10-CM | POA: Diagnosis not present

## 2021-01-15 MED ORDER — ICOSAPENT ETHYL 1 G PO CAPS: 2.0000 g | ORAL_CAPSULE | Freq: Two times a day (BID) | ORAL | 3 refills | Status: DC

## 2021-01-15 NOTE — Progress Notes (Signed)
Subjective:  Primary Physician:  [name], MD  Patient ID: Miranda Baker, female    DOB: [birth year], 60 y.o.   MRN: [id number]  Chief Complaint  Patient presents with   precordial pain   Coronary Artery Disease   Follow-up    4 weeks    HPI: Miranda Baker  is a 60 y.o. female  with coronary artery disease, coronary angioplasty to the diagonal 1 branch of the LAD  in July 2012. Cardiac catheterization on 01/21/2012 revealing widely patent diagonal stent with mild luminal irregularity in the LAD.  Medical history significant for hypertension, hyperlipidemia.  Patient presents for 4-week follow-up after recent urgent visit with complaints of chest pain and shortness of breath.  Patient's symptoms are suspicious for underlying GI etiology, however given cardiovascular risk factors patient underwent echocardiogram and nuclear stress test.  Echocardiogram revealed normal LVEF and was otherwise unremarkable.  Nuclear stress test was low risk.  Patient states since last visit she has made significant changes in her diet, specifically avoiding high fat foods.  She states this has improved her symptoms of chest discomfort.  She does continue to have episodes of chest discomfort both at rest and with exertion lasting <5 minutes.  Patient denies palpitations, syncope, near syncope, dizziness, orthopnea, PND.  Patient notably has GI appointment scheduled for later this month.  Past Medical History:  Diagnosis Date   Anxiety    Cellulitis    Complication of anesthesia    Constipation    Coronary artery disease    Stent 2.75x15 Xience 2011   Depression    GERD (gastroesophageal reflux disease)    Hyperlipidemia    Hypertension    Laboratory examination 09/24/2018   MI (mitral incompetence)    Paresthesia 09/24/2018   PONV (postoperative nausea and vomiting) 10/24/2020    Past Surgical History:  Procedure Laterality Date   ABDOMINAL HYSTERECTOMY  [facility]   COLONOSCOPY WITH  ESOPHAGOGASTRODUODENOSCOPY (EGD)     01/05/2019   ESOPHAGOGASTRODUODENOSCOPY N/A 10/07/2012   Procedure: ESOPHAGOGASTRODUODENOSCOPY (EGD);  Surgeon: Willis Modena, MD;  Location: [facility];  Service: Endoscopy;  Laterality: N/A;   HERNIA REPAIR     lap for adhesions     LEFT HEART CATHETERIZATION WITH CORONARY ANGIOGRAM N/A 01/21/2012   Procedure: LEFT HEART CATHETERIZATION WITH CORONARY ANGIOGRAM;  Surgeon: Pamella Pert, MD;  Location: [facility];  Service: Cardiovascular;  Laterality: N/A;   OOPHORECTOMY     SHOULDER ARTHROSCOPY Left 11/25/2019   Procedure: SHOULDER ARTHROSCOPY WITH DEBRIDEMENT EXTENSIVE WITH LYSIS OF ADHESIONS;  Surgeon: Bjorn Pippin, MD;  Location: [facility];  Service: Orthopedics;  Laterality: Left;   SHOULDER CLOSED REDUCTION Left 11/25/2019   Procedure: CLOSED MANIPULATION SHOULDER;  Surgeon: Bjorn Pippin, MD;  Location: [facility];  Service: Orthopedics;  Laterality: Left;   Family History  Problem Relation Age of Onset   Hypertension Mother    Heart disease Father        3 Stent placement   Prostate cancer Father    Hypertension Father    Heart disease Brother    Hypertension Brother    Hyperlipidemia Brother     Social History   Tobacco Use   Smoking status: Never   Smokeless tobacco: Never  Substance Use Topics   Alcohol use: No  Marital Status: Married   Allergies   Allergies  Allergen Reactions   Penicillins Anaphylaxis   Morphine And Related Nausea And Vomiting and Other (See  Comments)   Propofol     Cardiac dysrhthmias after EGD   Bactrim [Sulfamethoxazole-Trimethoprim] Nausea Only    Intolerance only   Erythromycin Nausea And Vomiting and Rash   Tetracyclines & Related Nausea And Vomiting and Rash   Vibramycin [Doxycycline Calcium] Nausea And Vomiting and Rash      Medications Prior to Visit:   Outpatient Medications Prior to Visit  Medication Sig Dispense Refill   ALPRAZolam (XANAX) 1 MG  tablet TAKE ONE TABLET BY MOUTH UP TO 3 TIMES DAILY AS NEEDED FOR ANXIETY 90 tablet 3   cholecalciferol (VITAMIN D3) 25 MCG (1000 UT) tablet Take 1,000 Units by mouth daily.     citalopram (CELEXA) 40 MG tablet TAKE 1 TABLET BY MOUTH EVERY DAY 90 tablet 2   clotrimazole-betamethasone (LOTRISONE) cream Apply 1 application topically 2 (two) times daily. 45 g 1   ezetimibe (ZETIA) 10 MG tablet TAKE 1 TABLET BY MOUTH EVERY DAY 90 tablet 3   lansoprazole (PREVACID) 30 MG capsule Take one bid for a week then start one daily by mouth (Patient taking differently: Take 30 mg by mouth 2 (two) times daily before a meal.) 37 capsule 0   methylphenidate (METADATE ER) 20 MG ER tablet TAKE ONE TABLET BY MOUTH EACH MORNING AND REPEAT ONCE AT NOON DAILY AS DIRECTED 180 tablet 0   metoprolol tartrate (LOPRESSOR) 25 MG tablet TAKE 1 TABLET BY MOUTH EVERY DAY 90 tablet 3   montelukast (SINGULAIR) 10 MG tablet Take 1 tablet (10 mg total) by mouth at bedtime. 90 tablet 3   nitroGLYCERIN (NITROSTAT) 0.4 MG SL tablet Place 1 tablet (0.4 mg total) under the tongue every 5 (five) minutes x 3 doses as needed for chest pain. 30 tablet 3   olmesartan (BENICAR) 40 MG tablet TAKE 1 TABLET BY MOUTH EVERY DAY 90 tablet 3   PATADAY 0.2 % SOLN INSTILL ONE DROP INTO THE AFFECTED EYE(S) ONCE DAILY AS DIRECTED (Patient taking differently: 1 drop See admin instructions. In affected eye as needed for allergic conjuctvitis) 2.5 mL 12   rosuvastatin (CRESTOR) 20 MG tablet Take 1 tablet (20 mg total) by mouth daily. 90 tablet 3   terconazole (TERAZOL 7) 0.4 % vaginal cream Place 1 applicator vaginally at bedtime. 45 g 0   icosapent Ethyl (VASCEPA) 1 g capsule Take 2 capsules (2 g total) by mouth daily after supper. 180 capsule 3   No facility-administered medications prior to visit.     Final Medications at End of Visit    Current Meds  Medication Sig   ALPRAZolam (XANAX) 1 MG tablet TAKE ONE TABLET BY MOUTH UP TO 3 TIMES DAILY AS  NEEDED FOR ANXIETY   cholecalciferol (VITAMIN D3) 25 MCG (1000 UT) tablet Take 1,000 Units by mouth daily.   citalopram (CELEXA) 40 MG tablet TAKE 1 TABLET BY MOUTH EVERY DAY   clotrimazole-betamethasone (LOTRISONE) cream Apply 1 application topically 2 (two) times daily.   ezetimibe (ZETIA) 10 MG tablet TAKE 1 TABLET BY MOUTH EVERY DAY   lansoprazole (PREVACID) 30 MG capsule Take one bid for a week then start one daily by mouth (Patient taking differently: Take 30 mg by mouth 2 (two) times daily before a meal.)   methylphenidate (METADATE ER) 20 MG ER tablet TAKE ONE TABLET BY MOUTH EACH MORNING AND REPEAT ONCE AT NOON DAILY AS DIRECTED   metoprolol tartrate (LOPRESSOR) 25 MG tablet TAKE 1 TABLET BY MOUTH EVERY DAY   montelukast (SINGULAIR) 10 MG tablet Take 1 tablet (10  mg total) by mouth at bedtime.   nitroGLYCERIN (NITROSTAT) 0.4 MG SL tablet Place 1 tablet (0.4 mg total) under the tongue every 5 (five) minutes x 3 doses as needed for chest pain.   olmesartan (BENICAR) 40 MG tablet TAKE 1 TABLET BY MOUTH EVERY DAY   PATADAY 0.2 % SOLN INSTILL ONE DROP INTO THE AFFECTED EYE(S) ONCE DAILY AS DIRECTED (Patient taking differently: 1 drop See admin instructions. In affected eye as needed for allergic conjuctvitis)   rosuvastatin (CRESTOR) 20 MG tablet Take 1 tablet (20 mg total) by mouth daily.   terconazole (TERAZOL 7) 0.4 % vaginal cream Place 1 applicator vaginally at bedtime.   [DISCONTINUED] icosapent Ethyl (VASCEPA) 1 g capsule Take 2 capsules (2 g total) by mouth daily after supper.      ROS:   Review of Systems  Respiratory:  Positive for shortness of breath (on exertion).   Cardiovascular:  Positive for chest pain (improvd). Negative for palpitations, orthopnea, claudication, leg swelling and PND.  Neurological:  Negative for dizziness.  Objective:  Blood pressure (!) 117/58, pulse 63, temperature 98.7 F (37.1 C), temperature source Temporal, resp. rate 16, height 5\' 4"  (1.626 m),  weight 194 lb 3.2 oz (88.1 kg), SpO2 95 %. Body mass index is 33.33 kg/m.   Vitals with BMI 01/15/2021 12/20/2020 12/12/2020  Height 5\' 4"  5\' 4"  -  Weight 194 lbs 3 oz 191 lbs 10 oz -  BMI 33.32 32.87 -  Systolic 117 98 132  Diastolic 58 64 76  Pulse 63 75 70    Physical Exam Vitals reviewed.  Constitutional:      General: She is not in acute distress.    Appearance: She is well-developed.     Comments: Mildly obese  Neck:     Vascular: No JVD.  Cardiovascular:     Rate and Rhythm: Normal rate and regular rhythm.     Pulses: Intact distal pulses.     Heart sounds: Normal heart sounds, S1 normal and S2 normal. No murmur heard.   No gallop.  Pulmonary:     Effort: Pulmonary effort is normal. No respiratory distress.     Breath sounds: Normal breath sounds. No wheezing, rhonchi or rales.  Musculoskeletal:     Right lower leg: No edema.     Left lower leg: No edema.  Neurological:     Mental Status: She is alert.    Laboratory examination::   CMP Latest Ref Rng & Units 09/29/2020 03/29/2020 11/22/2019  Glucose 65 - 99 mg/dL 86 92 97  BUN 8 - 27 mg/dL 21 14 18   Creatinine 0.57 - 1.00 mg/dL 1.61(W1.04(H) 9.600.95 4.540.93  Sodium 134 - 144 mmol/L 141 139 138  Potassium 3.5 - 5.2 mmol/L 4.5 4.3 4.9  Chloride 96 - 106 mmol/L 102 101 103  CO2 20 - 29 mmol/L 23 24 27   Calcium 8.7 - 10.3 mg/dL 10.7(H) 10.0 10.3  Total Protein 6.0 - 8.5 g/dL - 6.8 -  Total Bilirubin 0.0 - 1.2 mg/dL - 0.5 -  Alkaline Phos 48 - 121 IU/L - 62 -  AST 0 - 40 IU/L - 22 -  ALT 0 - 32 IU/L - 26 -   CBC Latest Ref Rng & Units 01/13/2019 01/07/2019 01/06/2019  WBC 3.4 - 10.8 x10E3/uL 7.6 6.5 11.3(H)  Hemoglobin 11.1 - 15.9 g/dL 09.812.2 11.5(L) 11.5(L)  Hematocrit 34.0 - 46.6 % 36.2 36.3 36.2  Platelets 150 - 450 x10E3/uL 274 219 232   Lipid Panel Recent  Labs    03/29/20 1139 09/29/20 1419 01/03/21 0800  CHOL  --  171 150  TRIG  --  217* 153*  LDLCALC  --  76 62  HDL  --  59 62  CHOLHDL  --  2.9  --   LDLDIRECT 64   --   --     HEMOGLOBIN A1C Lab Results  Component Value Date   HGBA1C 5.5 09/29/2020   MPG 120 (H) 01/17/2012   TSH Recent Labs    09/29/20 1419  TSH 3.190     Radiology:  No results found.   Cardiac Studies:    Coronary angiogram 01/21/12:  LAD: LAD gives origin to a large diagonal-1. Diagonal one is very large and he is equivalent to the LAD. LAD has mild luminal irregularities. There is a stent (July 2012 2.75x15 mm Xience stent) in the proximal portion of the D1 which is widely patent. The ostium of the D1 and the LAD just after the D1 origin show 20% stenoses. Mid LAD shows mild luminal irregularity. EF 60%  Exercise Sestamibi Stress Test 01/03/2021: Normal ECG stress. The patient exercised for 4 minutes and 19 seconds of a Bruce protocol, achieving approximately 6.22 METs. Markedly reduced exercise tolerance.  The heart rate response was accelerated.  Normal BP response. Myocardial perfusion is normal. Overall LV systolic function is normal without regional wall motion abnormalities. Stress LV EF: 77%. No previous exam available for comparison. Low risk.  Echocardiogram 01/04/2021:  Normal LV systolic function with visual EF 60-65%. Left ventricle cavity is normal in size. Normal global wall motion. Normal diastolic filling pattern, normal LAP.  Trace tricuspid regurgitation. No evidence of pulmonary hypertension.  No prior study for comparison.   EKG:   12/20/2020: Sinus bradycardia at a rate of 59 bpm.  Normal axis.  Poor R wave progression, cannot exclude anteroseptal infarct old.  Low voltage complexes.  Diffuse nonspecific T wave abnormality.  Compared to EKG 10/09/2020, no significant change.  Assessment     ICD-10-CM   1. Coronary artery disease of native artery of native heart with stable angina pectoris (HCC)  I25.118     2. Primary hypertension  I10     3. Hypercholesteremia  E78.00 icosapent Ethyl (VASCEPA) 1 g capsule    4. Coronary artery disease  involving native coronary artery of native heart without angina pectoris  I25.10 icosapent Ethyl (VASCEPA) 1 g capsule      Meds ordered this encounter  Medications   icosapent Ethyl (VASCEPA) 1 g capsule    Sig: Take 2 capsules (2 g total) by mouth 2 (two) times daily with a meal.    Dispense:  360 capsule    Refill:  3   Medications Discontinued During This Encounter  Medication Reason   icosapent Ethyl (VASCEPA) 1 g capsule    No orders of the defined types were placed in this encounter.   Recommendation:    CHEN HOLZMAN is a 60 y.o. with coronary artery disease, coronary angioplasty to the diagonal 1 branch of the LAD  in July 2012. Cardiac catheterization on 01/21/2012 revealing widely patent diagonal stent with mild luminal irregularity in the LAD.  Medical history significant for hypertension, hyperlipidemia.  Patient presents for 4-week follow-up after recent urgent visit with complaints of chest pain and shortness of breath.  Patient's symptoms are suspicious for underlying GI etiology, however given cardiovascular risk factors patient underwent echocardiogram and nuclear stress test.  Echocardiogram revealed normal LVEF and was otherwise unremarkable.  Nuclear stress test was low risk.  Reviewed and discussed with patient regarding results of echocardiogram and nuclear stress test, details above.  Given unremarkable echocardiogram and low risk nuclear stress test do not suspect cardiac etiology of patient's chest discomfort.  Rather patient symptoms are consistent with underlying GI etiology.  Encourage patient to keep upcoming GI appointment.  In regard to hypercholesterolemia, repeat lipid profile testing revealed patient's triglycerides remain >150.  Patient is presently taking Vascepa 2 mg once daily with dinner, will increase to 2 mg twice daily with meals.  Also encouraged patient to continue with diet and lifestyle modifications.   Patient is otherwise stable from a  cardiovascular standpoint.  Follow-up for annual visit with Dr. Jacinto Halim as previously scheduled.   Rayford Halsted, PA-C 01/15/2021, 4:33 PM Office: [phone]

## 2021-01-15 NOTE — Progress Notes (Signed)
Subjective:  Primary Physician:  [name], MD  Patient ID: Miranda Baker, female    DOB: Jun 08, 1961, 60 y.o.   MRN: [id number]  Chief Complaint  Patient presents with   precordial pain   Coronary Artery Disease   Follow-up    4 weeks    HPI: Miranda Baker  is a 60 y.o. female  with coronary artery disease, coronary angioplasty to the diagonal 1 branch of the LAD  in July 2012. Cardiac catheterization on 01/21/2012 revealing widely patent diagonal stent with mild luminal irregularity in the LAD.  Medical history significant for hypertension, hyperlipidemia.  Patient presents for urgent visit with complaints of chest pain and shortness of breath.  She was seen by PCP on 12/12/2020 with similar complaints.  They gave her trial of viscous lidocaine and Mylanta in the office which relieved her symptoms, therefore suspected chest pain was related to underlying GERD exacerbated by treatment with steroids and antibiotics.  She now presents to our office for other evaluation.  At last visit started her on Vascepa, which she is tolerating well.   Patient reports 2 weeks ago she began having episodes of intermittent chest pain both at rest and with exertion lasting 1 to 5 minutes with no associated symptoms.  Denies palpitations, syncope, near syncope, dizziness.  Denies orthopnea, PND, leg swelling.  Patient was seen by her PCP who suspected symptoms related to GERD, however PPI treatment has not improved patient's symptoms.  Patient states that she had GI work-up and had major complications and had to be hospitalized sometime last year, could not find it on epic.  Past Medical History:  Diagnosis Date   Anxiety    Cellulitis    Complication of anesthesia    Constipation    Coronary artery disease    Stent 2.75x15 Xience 2011   Depression    GERD (gastroesophageal reflux disease)    Hyperlipidemia    Hypertension    Laboratory examination 09/24/2018   MI (mitral incompetence)     Paresthesia 09/24/2018   PONV (postoperative nausea and vomiting) 10/24/2020    Past Surgical History:  Procedure Laterality Date   ABDOMINAL HYSTERECTOMY  [place]   COLONOSCOPY WITH ESOPHAGOGASTRODUODENOSCOPY (EGD)     01/05/2019   ESOPHAGOGASTRODUODENOSCOPY N/A 10/07/2012   Procedure: ESOPHAGOGASTRODUODENOSCOPY (EGD);  Surgeon: Willis Modena, MD;  Location: [facility];  Service: Endoscopy;  Laterality: N/A;   HERNIA REPAIR     lap for adhesions     LEFT HEART CATHETERIZATION WITH CORONARY ANGIOGRAM N/A 01/21/2012   Procedure: LEFT HEART CATHETERIZATION WITH CORONARY ANGIOGRAM;  Surgeon: Pamella Pert, MD;  Location: [facility] C[facility];  Service: Cardiovascular;  Laterality: N/A;   OOPHORECTOMY     SHOULDER ARTHROSCOPY Left 11/25/2019   Procedure: SHOULDER ARTHROSCOPY WITH DEBRIDEMENT EXTENSIVE WITH LYSIS OF ADHESIONS;  Surgeon: Bjorn Pippin, MD;  Location: [facility];  Service: Orthopedics;  Laterality: Left;   SHOULDER CLOSED REDUCTION Left 11/25/2019   Procedure: CLOSED MANIPULATION SHOULDER;  Surgeon: Bjorn Pippin, MD;  Location: [facility];  Service: Orthopedics;  Laterality: Left;   Family History  Problem Relation Age of Onset   Hypertension Mother    Heart disease Father        3 Stent placement   Prostate cancer Father    Hypertension Father    Heart disease Brother    Hypertension Brother    Hyperlipidemia Brother     Social History   Tobacco Use  Smoking status: Never   Smokeless tobacco: Never  Substance Use Topics   Alcohol use: No  Marital Status: Married    Allergies and medications   Allergies  Allergen Reactions   Penicillins Anaphylaxis   Morphine And Related Nausea And Vomiting and Other (See Comments)   Propofol     Cardiac dysrhthmias after EGD   Bactrim [Sulfamethoxazole-Trimethoprim] Nausea Only    Intolerance only   Erythromycin Nausea And Vomiting and Rash   Tetracyclines & Related Nausea And Vomiting and Rash    Vibramycin [Doxycycline Calcium] Nausea And Vomiting and Rash    Current Outpatient Medications on File Prior to Visit  Medication Sig Dispense Refill   ALPRAZolam (XANAX) 1 MG tablet TAKE ONE TABLET BY MOUTH UP TO 3 TIMES DAILY AS NEEDED FOR ANXIETY 90 tablet 3   cholecalciferol (VITAMIN D3) 25 MCG (1000 UT) tablet Take 1,000 Units by mouth daily.     citalopram (CELEXA) 40 MG tablet TAKE 1 TABLET BY MOUTH EVERY DAY 90 tablet 2   clotrimazole-betamethasone (LOTRISONE) cream Apply 1 application topically 2 (two) times daily. 45 g 1   ezetimibe (ZETIA) 10 MG tablet TAKE 1 TABLET BY MOUTH EVERY DAY 90 tablet 3   icosapent Ethyl (VASCEPA) 1 g capsule Take 2 capsules (2 g total) by mouth daily after supper. 180 capsule 3   lansoprazole (PREVACID) 30 MG capsule Take one bid for a week then start one daily by mouth (Patient taking differently: Take 30 mg by mouth 2 (two) times daily before a meal.) 37 capsule 0   methylphenidate (METADATE ER) 20 MG ER tablet TAKE ONE TABLET BY MOUTH EACH MORNING AND REPEAT ONCE AT NOON DAILY AS DIRECTED 180 tablet 0   metoprolol tartrate (LOPRESSOR) 25 MG tablet TAKE 1 TABLET BY MOUTH EVERY DAY 90 tablet 3   montelukast (SINGULAIR) 10 MG tablet Take 1 tablet (10 mg total) by mouth at bedtime. 90 tablet 3   nitroGLYCERIN (NITROSTAT) 0.4 MG SL tablet Place 1 tablet (0.4 mg total) under the tongue every 5 (five) minutes x 3 doses as needed for chest pain. 30 tablet 3   olmesartan (BENICAR) 40 MG tablet TAKE 1 TABLET BY MOUTH EVERY DAY 90 tablet 3   PATADAY 0.2 % SOLN INSTILL ONE DROP INTO THE AFFECTED EYE(S) ONCE DAILY AS DIRECTED (Patient taking differently: 1 drop See admin instructions. In affected eye as needed for allergic conjuctvitis) 2.5 mL 12   rosuvastatin (CRESTOR) 20 MG tablet Take 1 tablet (20 mg total) by mouth daily. 90 tablet 3   terconazole (TERAZOL 7) 0.4 % vaginal cream Place 1 applicator vaginally at bedtime. 45 g 0   No current facility-administered  medications on file prior to visit.   Medications after today's encounter  Current Outpatient Medications  Medication Instructions   ALPRAZolam (XANAX) 1 MG tablet TAKE ONE TABLET BY MOUTH UP TO 3 TIMES DAILY AS NEEDED FOR ANXIETY   cholecalciferol (VITAMIN D3) 1,000 Units, Oral, Daily   citalopram (CELEXA) 40 MG tablet TAKE 1 TABLET BY MOUTH EVERY DAY   clotrimazole-betamethasone (LOTRISONE) cream 1 application, Topical, 2 times daily   ezetimibe (ZETIA) 10 MG tablet TAKE 1 TABLET BY MOUTH EVERY DAY   icosapent Ethyl (VASCEPA) 2 g, Oral, Daily after supper   lansoprazole (PREVACID) 30 MG capsule Take one bid for a week then start one daily by mouth   methylphenidate (METADATE ER) 20 MG ER tablet TAKE ONE TABLET BY MOUTH EACH MORNING AND REPEAT ONCE AT NOON DAILY  AS DIRECTED   metoprolol tartrate (LOPRESSOR) 25 MG tablet TAKE 1 TABLET BY MOUTH EVERY DAY   montelukast (SINGULAIR) 10 mg, Oral, Daily at bedtime   nitroGLYCERIN (NITROSTAT) 0.4 mg, Sublingual, Every 5 min x3 PRN   olmesartan (BENICAR) 40 MG tablet TAKE 1 TABLET BY MOUTH EVERY DAY   PATADAY 0.2 % SOLN INSTILL ONE DROP INTO THE AFFECTED EYE(S) ONCE DAILY AS DIRECTED   rosuvastatin (CRESTOR) 20 mg, Oral, Daily   terconazole (TERAZOL 7) 0.4 % vaginal cream 1 applicator, Vaginal, Daily at bedtime     ROS:   Review of Systems  Respiratory:  Positive for shortness of breath (on exertion).   Cardiovascular:  Negative for chest pain, palpitations, orthopnea, claudication, leg swelling and PND.  Neurological:  Negative for dizziness.  Objective:  Blood pressure (!) 117/58, pulse 63, temperature 98.7 F (37.1 C), temperature source Temporal, resp. rate 16, height 5\' 4"  (1.626 m), weight 194 lb 3.2 oz (88.1 kg), SpO2 95 %. Body mass index is 33.33 kg/m.   Vitals with BMI 01/15/2021 12/20/2020 12/12/2020  Height 5\' 4"  5\' 4"  -  Weight 194 lbs 3 oz 191 lbs 10 oz -  BMI 33.32 32.87 -  Systolic 117 98 132  Diastolic 58 64 76  Pulse 63  75 70    Orthostatic VS for the past 72 hrs (Last 3 readings):  Patient Position BP Location Cuff Size  01/15/21 1516 Sitting Left Arm Large    Physical Exam Vitals reviewed.  Constitutional:      General: She is not in acute distress.    Appearance: She is well-developed.     Comments: Mildly obese  HENT:     Head: Normocephalic and atraumatic.  Neck:     Thyroid: No thyromegaly.     Vascular: No JVD.  Cardiovascular:     Rate and Rhythm: Normal rate and regular rhythm.     Pulses: Intact distal pulses.     Heart sounds: Normal heart sounds, S1 normal and S2 normal. No murmur heard.   No gallop.  Pulmonary:     Effort: Pulmonary effort is normal. No respiratory distress.     Breath sounds: Normal breath sounds. No wheezing, rhonchi or rales.  Abdominal:     General: Bowel sounds are normal.     Palpations: Abdomen is soft.  Musculoskeletal:        General: Normal range of motion.     Cervical back: Neck supple.     Right lower leg: No edema.     Left lower leg: No edema.  Skin:    General: Skin is warm and dry.  Neurological:     Mental Status: She is alert.    Laboratory examination::   CMP Latest Ref Rng & Units 09/29/2020 03/29/2020 11/22/2019  Glucose 65 - 99 mg/dL 86 92 97  BUN 8 - 27 mg/dL 21 14 18   Creatinine 0.57 - 1.00 mg/dL 10/01/2020) 03/31/2020 11/24/2019  Sodium 134 - 144 mmol/L 141 139 138  Potassium 3.5 - 5.2 mmol/L 4.5 4.3 4.9  Chloride 96 - 106 mmol/L 102 101 103  CO2 20 - 29 mmol/L 23 24 27   Calcium 8.7 - 10.3 mg/dL 10.7(H) 10.0 10.3  Total Protein 6.0 - 8.5 g/dL - 6.8 -  Total Bilirubin 0.0 - 1.2 mg/dL - 0.5 -  Alkaline Phos 48 - 121 IU/L - 62 -  AST 0 - 40 IU/L - 22 -  ALT 0 - 32 IU/L - 26 -  CBC Latest Ref Rng & Units 01/13/2019 01/07/2019 01/06/2019  WBC 3.4 - 10.8 x10E3/uL 7.6 6.5 11.3(H)  Hemoglobin 11.1 - 15.9 g/dL 57.3 11.5(L) 11.5(L)  Hematocrit 34.0 - 46.6 % 36.2 36.3 36.2  Platelets 150 - 450 x10E3/uL 274 219 232   Lipid Panel Recent Labs     03/29/20 1139 09/29/20 1419 01/03/21 0800  CHOL  --  171 150  TRIG  --  217* 153*  LDLCALC  --  76 62  HDL  --  59 62  CHOLHDL  --  2.9  --   LDLDIRECT 64  --   --     Lipid Panel     Component Value Date/Time   CHOL 150 01/03/2021 0800   TRIG 153 (H) 01/03/2021 0800   HDL 62 01/03/2021 0800   CHOLHDL 2.9 09/29/2020 1419   CHOLHDL 3.7 07/27/2014 0812   VLDL 33 07/27/2014 0812   LDLCALC 62 01/03/2021 0800   LDLDIRECT 64 03/29/2020 1139   LDLDIRECT 96 01/22/2013 0936   LABVLDL 26 01/03/2021 0800    HEMOGLOBIN A1C Lab Results  Component Value Date   HGBA1C 5.5 09/29/2020   MPG 120 (H) 01/17/2012   TSH Recent Labs    09/29/20 1419  TSH 3.190     Radiology:  No results found.   Cardiac Studies:    Coronary angiogram 01/21/12:  LAD: LAD gives origin to a large diagonal-1. Diagonal one is very large and he is equivalent to the LAD. LAD has mild luminal irregularities. There is a stent (July 2012 2.75x15 mm Xience stent) in the proximal portion of the D1 which is widely patent. The ostium of the D1 and the LAD just after the D1 origin show 20% stenoses. Mid LAD shows mild luminal irregularity. EF 60%  Echocardiogram 01/04/2021: Normal LV systolic function with visual EF 60-65%. Left ventricle cavity is normal in size. Normal global wall motion. Normal diastolic filling pattern, normal LAP. Trace tricuspid regurgitation. No evidence of pulmonary hypertension. No prior study for comparison.  Exercise Sestamibi Stress Test 01/03/2021: Normal ECG stress. The patient exercised for 4 minutes and 19 seconds of a Bruce protocol, achieving approximately 6.22 METs. Markedly reduced exercise tolerance.  The heart rate response was accelerated.  Normal BP response. Myocardial perfusion is normal. Overall LV systolic function is normal without regional wall motion abnormalities. Stress LV EF: 77%. No previous exam available for comparison. Low risk.   EKG:   EKG 12/20/2020:  Sinus bradycardia at a rate of 59 bpm.  Normal axis.  Poor R wave progression, cannot exclude anteroseptal infarct old.  Low voltage complexes.  Diffuse nonspecific T wave abnormality.  Compared to EKG 10/09/2020, no significant change.  Assessment     ICD-10-CM   1. Coronary artery disease of native artery of native heart with stable angina pectoris (HCC)  I25.118     2. Primary hypertension  I10     3. Hypercholesteremia  E78.00       No orders of the defined types were placed in this encounter.  There are no discontinued medications.  No orders of the defined types were placed in this encounter.   Recommendation:    Miranda Baker is a 60 y.o. with coronary artery disease, coronary angioplasty to the diagonal 1 branch of the LAD  in July 2012. Cardiac catheterization on 01/21/2012 revealing widely patent diagonal stent with mild luminal irregularity in the LAD.  Medical history significant for hypertension, hyperlipidemia.  Patient presents for urgent visit with  complaints of chest pain and shortness of breath.  She was seen by PCP on 12/12/2020 with similar complaints.  They gave her trial of viscous lidocaine and Mylanta in the office which relieved her symptoms, therefore suspected chest pain was related to underlying GERD exacerbated by treatment with steroids and antibiotics.  She now presents to our office for other evaluation.  At last visit started her on Vascepa, which she is tolerating well.   Patient presents with atypical chest pains.  EKG today is unchanged compared to previous without evidence of ischemia or infarct.  Patient's blood pressure is soft, which may be contributing to symptoms of fatigue, recommend patient discontinue Hydrochlorothiazide.  Patient will continue to monitor blood pressure closely and notify our office if it becomes elevated.  Emergent symptoms of atypical chest pain, will obtain echocardiogram and treadmill nuclear stress test given patient's cardiac  history and underlying risk factors.  Counseled patient regarding signs symptoms that would warrant urgent or emergent evaluation, verbalized understanding agreement.  Again discussed with patient regarding diet and lifestyle modifications as well as weight loss in order to reduce cardiovascular risk factors.  Patient is presently on guideline directed medical therapy for coronary artery disease including metoprolol, rosuvastatin, olmesartan.  Also obtain repeat lipid profile testing since initiation of Vascepa.  Follow-up in 4 weeks, sooner if needed, for chest pain and results of cardiac testing.   Yates Decamp, PA-C 01/15/2021, 3:53 PM Office: [phone]

## 2021-01-18 ENCOUNTER — Other Ambulatory Visit: Payer: Self-pay | Admitting: Family Medicine

## 2021-01-22 ENCOUNTER — Other Ambulatory Visit (HOSPITAL_COMMUNITY): Payer: Self-pay | Admitting: Physician Assistant

## 2021-01-22 ENCOUNTER — Other Ambulatory Visit: Payer: Self-pay | Admitting: Physician Assistant

## 2021-01-22 DIAGNOSIS — K219 Gastro-esophageal reflux disease without esophagitis: Secondary | ICD-10-CM | POA: Diagnosis not present

## 2021-01-22 DIAGNOSIS — R1013 Epigastric pain: Secondary | ICD-10-CM

## 2021-01-29 ENCOUNTER — Ambulatory Visit (HOSPITAL_COMMUNITY)
Admission: RE | Admit: 2021-01-29 | Discharge: 2021-01-29 | Disposition: A | Discharge status: Home / Self Care | Payer: BC Managed Care – PPO | Source: Ambulatory Visit | Attending: Physician Assistant | Admitting: Physician Assistant

## 2021-01-29 ENCOUNTER — Other Ambulatory Visit: Payer: Self-pay

## 2021-01-29 DIAGNOSIS — K219 Gastro-esophageal reflux disease without esophagitis: Secondary | ICD-10-CM | POA: Diagnosis not present

## 2021-01-29 DIAGNOSIS — R1013 Epigastric pain: Secondary | ICD-10-CM | POA: Insufficient documentation

## 2021-01-29 MED ORDER — TECHNETIUM TC 99M MEBROFENIN IV KIT
5.2000 | PACK | Freq: Once | INTRAVENOUS | Status: AC | PRN
  Administered 2021-01-29: 5.2 via INTRAVENOUS

## 2021-02-24 ENCOUNTER — Other Ambulatory Visit: Payer: Self-pay | Admitting: Family Medicine

## 2021-02-24 DIAGNOSIS — E7800 Pure hypercholesterolemia, unspecified: Secondary | ICD-10-CM

## 2021-02-25 ENCOUNTER — Other Ambulatory Visit: Payer: Self-pay | Admitting: Family Medicine

## 2021-02-27 MED ORDER — METHYLPHENIDATE HCL ER 20 MG PO TBCR: EXTENDED_RELEASE_TABLET | ORAL | 0 refills | Status: DC

## 2021-03-05 DIAGNOSIS — K2970 Gastritis, unspecified, without bleeding: Secondary | ICD-10-CM | POA: Diagnosis not present

## 2021-03-05 DIAGNOSIS — K219 Gastro-esophageal reflux disease without esophagitis: Secondary | ICD-10-CM | POA: Diagnosis not present

## 2021-03-19 ENCOUNTER — Other Ambulatory Visit: Payer: Self-pay | Admitting: Family Medicine

## 2021-03-19 DIAGNOSIS — H43393 Other vitreous opacities, bilateral: Secondary | ICD-10-CM | POA: Diagnosis not present

## 2021-03-19 DIAGNOSIS — H43813 Vitreous degeneration, bilateral: Secondary | ICD-10-CM | POA: Diagnosis not present

## 2021-03-26 DIAGNOSIS — H20021 Recurrent acute iridocyclitis, right eye: Secondary | ICD-10-CM | POA: Diagnosis not present

## 2021-03-26 DIAGNOSIS — H43393 Other vitreous opacities, bilateral: Secondary | ICD-10-CM | POA: Diagnosis not present

## 2021-03-26 DIAGNOSIS — H43813 Vitreous degeneration, bilateral: Secondary | ICD-10-CM | POA: Diagnosis not present

## 2021-04-29 ENCOUNTER — Other Ambulatory Visit: Payer: Self-pay | Admitting: Family Medicine

## 2021-05-02 ENCOUNTER — Other Ambulatory Visit: Payer: Self-pay | Admitting: Family Medicine

## 2021-05-02 ENCOUNTER — Encounter: Payer: Self-pay | Admitting: Family Medicine

## 2021-05-02 MED ORDER — LEVOFLOXACIN 500 MG PO TABS: 500.0000 mg | ORAL_TABLET | Freq: Every day | ORAL | 0 refills | Status: DC

## 2021-05-02 MED ORDER — CEFIXIME 400 MG PO CAPS: 400.0000 mg | ORAL_CAPSULE | Freq: Every day | ORAL | 0 refills | Status: DC

## 2021-05-02 NOTE — Progress Notes (Signed)
Cefixiome not available. Will rx levaquin

## 2021-05-07 ENCOUNTER — Other Ambulatory Visit: Payer: Self-pay | Admitting: Family Medicine

## 2021-05-14 DIAGNOSIS — H04551 Acquired stenosis of right nasolacrimal duct: Secondary | ICD-10-CM | POA: Diagnosis not present

## 2021-06-06 DIAGNOSIS — H2513 Age-related nuclear cataract, bilateral: Secondary | ICD-10-CM | POA: Diagnosis not present

## 2021-06-06 DIAGNOSIS — H52203 Unspecified astigmatism, bilateral: Secondary | ICD-10-CM | POA: Diagnosis not present

## 2021-06-06 DIAGNOSIS — H524 Presbyopia: Secondary | ICD-10-CM | POA: Diagnosis not present

## 2021-06-06 DIAGNOSIS — H5203 Hypermetropia, bilateral: Secondary | ICD-10-CM | POA: Diagnosis not present

## 2021-06-06 DIAGNOSIS — H43392 Other vitreous opacities, left eye: Secondary | ICD-10-CM | POA: Diagnosis not present

## 2021-06-06 DIAGNOSIS — H43811 Vitreous degeneration, right eye: Secondary | ICD-10-CM | POA: Diagnosis not present

## 2021-07-06 ENCOUNTER — Encounter: Payer: Self-pay | Admitting: *Deleted

## 2021-07-29 ENCOUNTER — Telehealth: Payer: BC Managed Care – PPO | Admitting: Physician Assistant

## 2021-07-29 ENCOUNTER — Telehealth: Payer: BC Managed Care – PPO | Admitting: Family Medicine

## 2021-07-29 DIAGNOSIS — U071 COVID-19: Secondary | ICD-10-CM

## 2021-07-29 DIAGNOSIS — J069 Acute upper respiratory infection, unspecified: Secondary | ICD-10-CM

## 2021-07-29 MED ORDER — BENZONATATE 100 MG PO CAPS
100.0000 mg | ORAL_CAPSULE | Freq: Three times a day (TID) | ORAL | 0 refills | Status: DC | PRN
  Filled 2021-07-29: qty 20, 4d supply, fill #0

## 2021-07-29 MED ORDER — MOLNUPIRAVIR EUA 200MG CAPSULE: 4.0000 | ORAL_CAPSULE | Freq: Two times a day (BID) | ORAL | 0 refills | Status: DC

## 2021-07-29 MED ORDER — LIDOCAINE VISCOUS HCL 2 % MT SOLN: OROMUCOSAL | 0 refills | Status: DC

## 2021-07-29 MED ORDER — MOLNUPIRAVIR EUA 200MG CAPSULE
4.0000 | ORAL_CAPSULE | Freq: Two times a day (BID) | ORAL | 0 refills | Status: AC
Start: 2021-07-29 — End: 2021-08-03

## 2021-07-29 MED ORDER — ONDANSETRON HCL 4 MG PO TABS: 4.0000 mg | ORAL_TABLET | Freq: Three times a day (TID) | ORAL | 0 refills | Status: DC | PRN

## 2021-07-29 MED ORDER — ALBUTEROL SULFATE HFA 108 (90 BASE) MCG/ACT IN AERS
1.0000 | INHALATION_SPRAY | Freq: Four times a day (QID) | RESPIRATORY_TRACT | 0 refills | Status: DC | PRN
  Filled 2021-07-29 – 2021-08-15 (×2): qty 8.5, 25d supply, fill #0

## 2021-07-29 NOTE — Progress Notes (Signed)
Virtual Visit Consent   Miranda Baker, you are scheduled for a virtual visit with a [facility] provider today.     Just as with appointments in the office, your consent must be obtained to participate.  Your consent will be active for this visit and any virtual visit you may have with one of our providers in the next 365 days.     If you have a MyChart account, a copy of this consent can be sent to you electronically.  All virtual visits are billed to your insurance company just like a traditional visit in the office.    As this is a virtual visit, video technology does not allow for your provider to perform a traditional examination.  This may limit your provider's ability to fully assess your condition.  If your provider identifies any concerns that need to be evaluated in person or the need to arrange testing (such as labs, EKG, etc.), we will make arrangements to do so.     Although advances in technology are sophisticated, we cannot ensure that it will always work on either your end or our end.  If the connection with a video visit is poor, the visit may have to be switched to a telephone visit.  With either a video or telephone visit, we are not always able to ensure that we have a secure connection.     I need to obtain your verbal consent now.   Are you willing to proceed with your visit today?    Miranda Baker has provided verbal consent on 07/29/2021 for a virtual visit (video or telephone).   [name], PA-C   Date: 07/29/2021 4:47 PM   Virtual Visit via Video Note   IMargaretann Loveless, connected with  Miranda Baker  ([id number], Miranda Baker, 1962) on 07/29/21 at  4:45 PM EST by a video-enabled telemedicine application and verified that I am speaking with the correct person using two identifiers.  Location: Patient: Virtual Visit Location Patient: Home Provider: Virtual Visit Location Provider: Home Office   I discussed the limitations of evaluation and management by  telemedicine and the availability of in person appointments. The patient expressed understanding and agreed to proceed.    History of Present Illness: Miranda Baker is a 59 y.o. who identifies as a female who was assigned female at birth, and is being seen today for Covid 18.  HPI: URI  This is a new problem. Episode onset: Symptoms started on Friday, tested positive today. The problem has been gradually worsening. Maximum temperature: subjective fever. Associated symptoms include congestion, coughing, ear pain, headaches, nausea, a plugged ear sensation, rhinorrhea, sinus pain and a sore throat. Pertinent negatives include no diarrhea or vomiting. Associated symptoms comments: Laryngitis, fatigue, chills. Treatments tried: advil. The treatment provided no relief.     Problems:  Patient Active Problem List   Diagnosis Date Noted   PONV (postoperative nausea and vomiting) 10/24/2020   History of general anesthesia complication 10/24/2020   External hemorrhoids with complication 10/24/2020   Prolapsed internal hemorrhoids, grade 3 10/24/2020   Well adult exam 03/29/2020   PTSD (post-traumatic stress disorder) 09/17/2019   Restless legs 07/08/2019   CAD (coronary artery disease) 01/06/2019   Panic disorder (episodic paroxysmal anxiety) 03/23/2018   Hyperlipidemia 09/27/2014   Radiculopathy, lumbar region 09/26/2014   Attention and concentration deficit 08/13/2012   Postsurgical percutaneous transluminal coronary angioplasty (PTCA) status 01/21/2012   Allergic rhinitis 10/28/2011   Dysthymia 01/09/2011  Sinusitis, chronic 123456   Other complicated headache syndrome 05/20/2010   Coronary atherosclerosis 11/28/2009   GERD, SEVERE 12/Baker/2010   CONSTIPATION 05/17/2009   HTN (hypertension) 04/17/2009   OTHER TEAR CARTILAGE OR MENISCUS KNEE CURRENT 07/19/2008   LATERAL EPICONDYLITIS, RIGHT 07/13/2007    Allergies:  Allergies  Allergen Reactions   Penicillins Anaphylaxis   Morphine  And Related Nausea And Vomiting and Other (See Comments)   Propofol     Cardiac dysrhthmias after EGD   Bactrim [Sulfamethoxazole-Trimethoprim] Nausea Only    Intolerance only   Erythromycin Nausea And Vomiting and Rash   Tetracyclines & Related Nausea And Vomiting and Rash   Vibramycin [Doxycycline Calcium] Nausea And Vomiting and Rash   Medications:  Current Outpatient Medications:    albuterol (VENTOLIN HFA) 108 (90 Base) MCG/ACT inhaler, Inhale 1-2 puffs into the lungs every 6 (six) hours as needed for wheezing or shortness of breath., Disp: 8 g, Rfl: 0   ALPRAZolam (XANAX) 1 MG tablet, TAKE ONE TABLET BY MOUTH UP TO 3 TIMES DAILY AS NEEDED FOR ANXIETY, Disp: 90 tablet, Rfl: 3   benzonatate (TESSALON PERLES) 100 MG capsule, Take 1-2 capsules (100-200 mg total) by mouth 3 (three) times daily as needed for cough., Disp: 20 capsule, Rfl: 0   cefixime (SUPRAX) 400 MG CAPS capsule, Take 1 capsule (400 mg total) by mouth daily., Disp: 7 capsule, Rfl: 0   cholecalciferol (VITAMIN D3) 25 MCG (1000 UT) tablet, Take 1,000 Units by mouth daily., Disp: , Rfl:    citalopram (CELEXA) 40 MG tablet, TAKE 1 TABLET BY MOUTH EVERY DAY, Disp: 90 tablet, Rfl: 2   clotrimazole-betamethasone (LOTRISONE) cream, Apply 1 application topically 2 (two) times daily., Disp: 45 g, Rfl: 1   ezetimibe (ZETIA) 10 MG tablet, TAKE 1 TABLET BY MOUTH EVERY DAY, Disp: 90 tablet, Rfl: 3   icosapent Ethyl (VASCEPA) 1 g capsule, Take 2 capsules (2 g total) by mouth 2 (two) times daily with a meal., Disp: 360 capsule, Rfl: 3   lansoprazole (PREVACID) 30 MG capsule, TAKE ONE TWICE DAILY FOR A WEEK THEN START ONE DAILY BY MOUTH, Disp: 37 capsule, Rfl: 0   levofloxacin (LEVAQUIN) 500 MG tablet, Take 1 tablet (500 mg total) by mouth daily., Disp: 7 tablet, Rfl: 0   lidocaine (XYLOCAINE) 2 % solution, 5 mL swish and swallow and repeat every 4 hours, Disp: 100 mL, Rfl: 0   methylphenidate (METADATE ER) 20 MG ER tablet, TAKE ONE TABLET BY  MOUTH EACH MORNING AND REPEAT ONCE AT NOON DAILY AS DIRECTED, Disp: 180 tablet, Rfl: 0   metoprolol tartrate (LOPRESSOR) 25 MG tablet, TAKE 1 TABLET BY MOUTH EVERY DAY, Disp: 90 tablet, Rfl: 3   molnupiravir EUA (LAGEVRIO) 200 mg CAPS capsule, Take 4 capsules (800 mg total) by mouth 2 (two) times daily for 5 days., Disp: 40 capsule, Rfl: 0   montelukast (SINGULAIR) 10 MG tablet, TAKE 1 TABLET BY MOUTH EVERYDAY AT BEDTIME, Disp: 90 tablet, Rfl: 3   nitroGLYCERIN (NITROSTAT) 0.4 MG SL tablet, Place 1 tablet (0.4 mg total) under the tongue every 5 (five) minutes x 3 doses as needed for chest pain., Disp: 30 tablet, Rfl: 3   olmesartan (BENICAR) 40 MG tablet, TAKE 1 TABLET BY MOUTH EVERY DAY, Disp: 90 tablet, Rfl: 3   ondansetron (ZOFRAN) 4 MG tablet, Take 1 tablet (4 mg total) by mouth every 8 (eight) hours as needed for nausea or vomiting., Disp: 20 tablet, Rfl: 0   PATADAY 0.2 % SOLN, INSTILL  ONE DROP INTO THE AFFECTED EYE(S) ONCE DAILY AS DIRECTED (Patient taking differently: 1 drop See admin instructions. In affected eye as needed for allergic conjuctvitis), Disp: 2.5 mL, Rfl: 12   rosuvastatin (CRESTOR) 20 MG tablet, TAKE 1 TABLET BY MOUTH EVERY DAY, Disp: 90 tablet, Rfl: 3   terconazole (TERAZOL 7) 0.4 % vaginal cream, Place 1 applicator vaginally at bedtime., Disp: 45 g, Rfl: 0  Observations/Objective: Patient is well-developed, well-nourished in no acute distress.  Resting comfortably at home.  Head is normocephalic, atraumatic.  No labored breathing.  Speech is clear and coherent with logical content.  Patient is alert and oriented at baseline.    Assessment and Plan: 1. COVID-19 - lidocaine (XYLOCAINE) 2 % solution; 5 mL swish and swallow and repeat every 4 hours  Dispense: 100 mL; Refill: 0 - molnupiravir EUA (LAGEVRIO) 200 mg CAPS capsule; Take 4 capsules (800 mg total) by mouth 2 (two) times daily for 5 days.  Dispense: 40 capsule; Refill: 0 - ondansetron (ZOFRAN) 4 MG tablet; Take  1 tablet (4 mg total) by mouth every 8 (eight) hours as needed for nausea or vomiting.  Dispense: 20 tablet; Refill: 0 - MyChart COVID-19 home monitoring program; Future  - Continue OTC symptomatic management of choice - Will send OTC vitamins and supplement information through AVS - Molnupiravir, viscous lidocaine and zofran prescribed - Patient enrolled in MyChart symptom monitoring - Push fluids - Rest as needed - Discussed return precautions and when to seek in-person evaluation, sent via AVS as well  Follow Up Instructions: I discussed the assessment and treatment plan with the patient. The patient was provided an opportunity to ask questions and all were answered. The patient agreed with the plan and demonstrated an understanding of the instructions.  A copy of instructions were sent to the patient via MyChart unless otherwise noted below.    The patient was advised to call back or seek an in-person evaluation if the symptoms worsen or if the condition fails to improve as anticipated.  Time:  I spent 14 minutes with the patient via telehealth technology discussing the above problems/concerns.    Mar Daring, PA-C

## 2021-07-29 NOTE — Progress Notes (Signed)
Based on the information that you have shared in the e-Visit Questionnaire, we recommend that you convert this visit to a video visit.  If you desire the FDA-emergency use antiviral medication options, we do require a video visit for that. To access a video visit please select the "Virtual Urgent Care Visit" option in the [employer].  If you convert to a video visit, we will bill your insurance (similar to an office visit) and you will not be charged for this e-Visit. You will be able to stay at home and speak with the first available [facility] advanced practice provider. The link to do a video visit is in the drop down Menu tab of your Welcome screen in MyChart.

## 2021-07-29 NOTE — Patient Instructions (Signed)
Miranda Baker, thank you for joining Miranda Loveless, PA-C for today's virtual visit.  While this provider is not your primary care provider (PCP), if your PCP is located in our provider database this encounter information will be shared with them immediately following your visit.  Consent: (Patient) Miranda Baker provided verbal consent for this virtual visit at the beginning of the encounter.  Current Medications:  Current Outpatient Medications:    albuterol (VENTOLIN HFA) 108 (90 Base) MCG/ACT inhaler, Inhale 1-2 puffs into the lungs every 6 (six) hours as needed for wheezing or shortness of breath., Disp: 8 g, Rfl: 0   ALPRAZolam (XANAX) 1 MG tablet, TAKE ONE TABLET BY MOUTH UP TO 3 TIMES DAILY AS NEEDED FOR ANXIETY, Disp: 90 tablet, Rfl: 3   benzonatate (TESSALON PERLES) 100 MG capsule, Take 1-2 capsules (100-200 mg total) by mouth 3 (three) times daily as needed for cough., Disp: 20 capsule, Rfl: 0   cefixime (SUPRAX) 400 MG CAPS capsule, Take 1 capsule (400 mg total) by mouth daily., Disp: 7 capsule, Rfl: 0   cholecalciferol (VITAMIN D3) 25 MCG (1000 UT) tablet, Take 1,000 Units by mouth daily., Disp: , Rfl:    citalopram (CELEXA) 40 MG tablet, TAKE 1 TABLET BY MOUTH EVERY DAY, Disp: 90 tablet, Rfl: 2   clotrimazole-betamethasone (LOTRISONE) cream, Apply 1 application topically 2 (two) times daily., Disp: 45 g, Rfl: 1   ezetimibe (ZETIA) 10 MG tablet, TAKE 1 TABLET BY MOUTH EVERY DAY, Disp: 90 tablet, Rfl: 3   icosapent Ethyl (VASCEPA) 1 g capsule, Take 2 capsules (2 g total) by mouth 2 (two) times daily with a meal., Disp: 360 capsule, Rfl: 3   lansoprazole (PREVACID) 30 MG capsule, TAKE ONE TWICE DAILY FOR A WEEK THEN START ONE DAILY BY MOUTH, Disp: 37 capsule, Rfl: 0   levofloxacin (LEVAQUIN) 500 MG tablet, Take 1 tablet (500 mg total) by mouth daily., Disp: 7 tablet, Rfl: 0   lidocaine (XYLOCAINE) 2 % solution, 5 mL swish and swallow and repeat every 4 hours, Disp: 100 mL, Rfl: 0    methylphenidate (METADATE ER) 20 MG ER tablet, TAKE ONE TABLET BY MOUTH EACH MORNING AND REPEAT ONCE AT NOON DAILY AS DIRECTED, Disp: 180 tablet, Rfl: 0   metoprolol tartrate (LOPRESSOR) 25 MG tablet, TAKE 1 TABLET BY MOUTH EVERY DAY, Disp: 90 tablet, Rfl: 3   molnupiravir EUA (LAGEVRIO) 200 mg CAPS capsule, Take 4 capsules (800 mg total) by mouth 2 (two) times daily for 5 days., Disp: 40 capsule, Rfl: 0   montelukast (SINGULAIR) 10 MG tablet, TAKE 1 TABLET BY MOUTH EVERYDAY AT BEDTIME, Disp: 90 tablet, Rfl: 3   nitroGLYCERIN (NITROSTAT) 0.4 MG SL tablet, Place 1 tablet (0.4 mg total) under the tongue every 5 (five) minutes x 3 doses as needed for chest pain., Disp: 30 tablet, Rfl: 3   olmesartan (BENICAR) 40 MG tablet, TAKE 1 TABLET BY MOUTH EVERY DAY, Disp: 90 tablet, Rfl: 3   ondansetron (ZOFRAN) 4 MG tablet, Take 1 tablet (4 mg total) by mouth every 8 (eight) hours as needed for nausea or vomiting., Disp: 20 tablet, Rfl: 0   PATADAY 0.2 % SOLN, INSTILL ONE DROP INTO THE AFFECTED EYE(S) ONCE DAILY AS DIRECTED (Patient taking differently: 1 drop See admin instructions. In affected eye as needed for allergic conjuctvitis), Disp: 2.5 mL, Rfl: 12   rosuvastatin (CRESTOR) 20 MG tablet, TAKE 1 TABLET BY MOUTH EVERY DAY, Disp: 90 tablet, Rfl: 3   terconazole (TERAZOL 7)  0.4 % vaginal cream, Place 1 applicator vaginally at bedtime., Disp: 45 g, Rfl: 0   Medications ordered in this encounter:  Meds ordered this encounter  Medications   DISCONTD: molnupiravir EUA (LAGEVRIO) 200 mg CAPS capsule    Sig: Take 4 capsules (800 mg total) by mouth 2 (two) times daily for 5 days.    Dispense:  40 capsule    Refill:  0    Order Specific Question:   Supervising Provider    Answer:   Hyacinth Meeker, BRIAN [[id number]]   DISCONTD: lidocaine (XYLOCAINE) 2 % solution    Sig: 5 mL swish and swallow and repeat every 4 hours    Dispense:  100 mL    Refill:  0    Order Specific Question:   Supervising Provider    Answer:    Eber Hong [[id number]]   DISCONTD: ondansetron (ZOFRAN) 4 MG tablet    Sig: Take 1 tablet (4 mg total) by mouth every 8 (eight) hours as needed for nausea or vomiting.    Dispense:  20 tablet    Refill:  0    Order Specific Question:   Supervising Provider    Answer:   MILLER, BRIAN [[id number]]   lidocaine (XYLOCAINE) 2 % solution    Sig: 5 mL swish and swallow and repeat every 4 hours    Dispense:  100 mL    Refill:  0    Order Specific Question:   Supervising Provider    Answer:   Hyacinth Meeker, BRIAN [[id number]]   molnupiravir EUA (LAGEVRIO) 200 mg CAPS capsule    Sig: Take 4 capsules (800 mg total) by mouth 2 (two) times daily for 5 days.    Dispense:  40 capsule    Refill:  0    Order Specific Question:   Supervising Provider    Answer:   MILLER, BRIAN [[id number]]   ondansetron (ZOFRAN) 4 MG tablet    Sig: Take 1 tablet (4 mg total) by mouth every 8 (eight) hours as needed for nausea or vomiting.    Dispense:  20 tablet    Refill:  0    Order Specific Question:   Supervising Provider    Answer:   Hyacinth Meeker, BRIAN [[id number]]     *If you need refills on other medications prior to your next appointment, please contact your pharmacy*  Follow-Up: Call back or seek an in-person evaluation if the symptoms worsen or if the condition fails to improve as anticipated.  Other Instructions Molnupiravir Oral Capsules What is this medication? MOLNUPIRAVIR (mol nue pir a vir) treats COVID-19. It is an antiviral medication. It may decrease the risk of developing severe symptoms of COVID-19. It may also decrease the chance of going to the hospital. This medication is not approved by the FDA. The FDA has authorized emergency use of this medication during the COVID-19 pandemic. This medicine may be used for other purposes; ask your health care provider or pharmacist if you have questions. COMMON BRAND NAME(S): LAGEVRIO What should I tell my care team before I take this medication? They need to know if you have any of these  conditions: Any allergies Any serious illness An unusual or allergic reaction to molnupiravir, other medications, foods, dyes, or preservatives Pregnant or trying to get pregnant Breast-feeding How should I use this medication? Take this medication by mouth with water. Take it as directed on the prescription label at the same time every day. Do not cut, crush or chew this medication. Swallow the capsules whole.  You can take it with or without food. If it upsets your stomach, take it with food. Take all of this medication unless your care team tells you to stop it early. Keep taking it even if you think you are better. Talk to your care team about the use of this medication in children. Special care may be needed. Overdosage: If you think you have taken too much of this medicine contact a poison control center or emergency room at once. NOTE: This medicine is only for you. Do not share this medicine with others. What if I miss a dose? If you miss a dose, take it as soon as you can unless it is more than 10 hours late. If it is more than 10 hours late, skip the missed dose. Take the next dose at the normal time. Do not take extra or 2 doses at the same time to make up for the missed dose. What may interact with this medication? Interactions have not been studied. This list may not describe all possible interactions. Give your health care provider a list of all the medicines, herbs, non-prescription drugs, or dietary supplements you use. Also tell them if you smoke, drink alcohol, or use illegal drugs. Some items may interact with your medicine. What should I watch for while using this medication? Your condition will be monitored carefully while you are receiving this medication. Visit your care team for regular checkups. Tell your care team if your symptoms do not start to get better or if they get worse. Do not become pregnant while taking this medication. You may need a pregnancy test before  starting this medication. Women must use a reliable form of birth control while taking this medication and for 4 days after stopping the medication. Women should inform their care team if they wish to become pregnant or think they might be pregnant. Men should not father a child while taking this medication and for 3 months after stopping it. There is potential for serious harm to an unborn child. Talk to your care team for more information. Do not breast-feed an infant while taking this medication and for 4 days after stopping the medication. What side effects may I notice from receiving this medication? Side effects that you should report to your care team as soon as possible: Allergic reactions--skin rash, itching, hives, swelling of the face, lips, tongue, or throat Side effects that usually do not require medical attention (report these to your care team if they continue or are bothersome): Diarrhea Dizziness Nausea This list may not describe all possible side effects. Call your doctor for medical advice about side effects. You may report side effects to FDA at 1-800-FDA-1088. Where should I keep my medication? Keep out of the reach of children and pets. Store at room temperature between 20 and 25 degrees C (68 and 77 degrees F). Get rid of any unused medication after the expiration date. To get rid of medications that are no longer needed or have expired: Take the medication to a medication take-back program. Check with your pharmacy or law enforcement to find a location. If you cannot return the medication, check the label or package insert to see if the medication should be thrown out in the garbage or flushed down the toilet. If you are not sure, ask your care team. If it is safe to put it in the trash, take the medication out of the container. Mix the medication with cat litter, dirt, coffee grounds, or other unwanted  substance. Seal the mixture in a bag or container. Put it in the  trash. NOTE: This sheet is a summary. It may not cover all possible information. If you have questions about this medicine, talk to your doctor, pharmacist, or health care provider.  2022 Elsevier/Gold Standard (2020-07-31 00:00:00)   10 Things You Can Do to Manage Your COVID-19 Symptoms at Home If you have possible or confirmed COVID-19 Stay home except to get medical care. Monitor your symptoms carefully. If your symptoms get worse, call your healthcare provider immediately. Get rest and stay hydrated. If you have a medical appointment, call the healthcare provider ahead of time and tell them that you have or may have COVID-19. For medical emergencies, call 911 and notify the dispatch personnel that you have or may have COVID-19. Cover your cough and sneezes with a tissue or use the inside of your elbow. Wash your hands often with soap and water for at least 20 seconds or clean your hands with an alcohol-based hand sanitizer that contains at least 60% alcohol. As much as possible, stay in a specific room and away from other people in your home. Also, you should use a separate bathroom, if available. If you need to be around other people in or outside of the home, wear a mask. Avoid sharing personal items with other people in your household, like dishes, towels, and bedding. Clean all surfaces that are touched often, like counters, tabletops, and doorknobs. Use household cleaning sprays or wipes according to the label instructions. [email or web address] 02/18/2020 This information is not intended to replace advice given to you by your health care provider. Make sure you discuss any questions you have with your health care provider. Document Revised: 04/13/2021 Document Reviewed: 04/13/2021 Elsevier Patient Education  2022 [employer].    If you have been instructed to have an in-person evaluation today at a local Urgent Care facility, please use the link below. It will take you to a list  of all of our available [facility], including address, phone number and hours of operation. Please do not delay care.  [facility]  If you or a family member do not have a primary care provider, use the link below to schedule a visit and establish care. When you choose a [facility] primary care physician or advanced practice provider, you gain a long-term partner in health. Find a Primary Care Provider  Learn more about [facility]'s in-office and virtual care options: [facility] - Get Care Now

## 2021-07-29 NOTE — Progress Notes (Signed)
We are sorry that you are not feeling well.  Here is how we plan to help!  Based on your presentation I believe you most likely have A cough due to a virus.  This is called viral bronchitis and is best treated by rest, plenty of fluids and control of the cough.  You may use Ibuprofen or Tylenol as directed to help your symptoms.     In addition you may use A prescription cough medication called Tessalon Perles 100mg. You may take 1-2 capsules every 8 hours as needed for your cough.    From your responses in the eVisit questionnaire you describe inflammation in the upper respiratory tract which is causing a significant cough.  This is commonly called Bronchitis and has four common causes:   Allergies Viral Infections Acid Reflux Bacterial Infection Allergies, viruses and acid reflux are treated by controlling symptoms or eliminating the cause. An example might be a cough caused by taking certain blood pressure medications. You stop the cough by changing the medication. Another example might be a cough caused by acid reflux. Controlling the reflux helps control the cough.  USE OF BRONCHODILATOR ("RESCUE") INHALERS: There is a risk from using your bronchodilator too frequently.  The risk is that over-reliance on a medication which only relaxes the muscles surrounding the breathing tubes can reduce the effectiveness of medications prescribed to reduce swelling and congestion of the tubes themselves.  Although you feel brief relief from the bronchodilator inhaler, your asthma may actually be worsening with the tubes becoming more swollen and filled with mucus.  This can delay other crucial treatments, such as oral steroid medications. If you need to use a bronchodilator inhaler daily, several times per day, you should discuss this with your provider.  There are probably better treatments that could be used to keep your asthma under control.     HOME CARE Only take medications as instructed by your  medical team. Complete the entire course of an antibiotic. Drink plenty of fluids and get plenty of rest. Avoid close contacts especially the very young and the elderly Cover your mouth if you cough or cough into your sleeve. Always remember to wash your hands A steam or ultrasonic humidifier can help congestion.   GET HELP RIGHT AWAY IF: You develop worsening fever. You become short of breath You cough up blood. Your symptoms persist after you have completed your treatment plan MAKE SURE YOU  Understand these instructions. Will watch your condition. Will get help right away if you are not doing well or get worse.    Thank you for choosing an e-visit.  Your e-visit answers were reviewed by a board certified advanced clinical practitioner to complete your personal care plan. Depending upon the condition, your plan could have included both over the counter or prescription medications.  Please review your pharmacy choice. Make sure the pharmacy is open so you can pick up prescription now. If there is a problem, you may contact your provider through MyChart messaging and have the prescription routed to another pharmacy.  Your safety is important to us. If you have drug allergies check your prescription carefully.   For the next 24 hours you can use MyChart to ask questions about today's visit, request a non-urgent call back, or ask for a work or school excuse. You will get an email in the next two days asking about your experience. I hope that your e-visit has been valuable and will speed your recovery.]  I provided 5   minutes of non face-to-face time during this encounter for chart review, medication and order placement, as well as and documentation.   

## 2021-07-31 ENCOUNTER — Other Ambulatory Visit (HOSPITAL_COMMUNITY): Payer: Self-pay

## 2021-08-06 ENCOUNTER — Other Ambulatory Visit (HOSPITAL_COMMUNITY): Payer: Self-pay

## 2021-08-07 DIAGNOSIS — Z20822 Contact with and (suspected) exposure to covid-19: Secondary | ICD-10-CM | POA: Diagnosis not present

## 2021-08-10 ENCOUNTER — Other Ambulatory Visit: Payer: Self-pay | Admitting: Family Medicine

## 2021-08-15 ENCOUNTER — Other Ambulatory Visit (HOSPITAL_BASED_OUTPATIENT_CLINIC_OR_DEPARTMENT_OTHER): Payer: Self-pay

## 2021-08-26 ENCOUNTER — Other Ambulatory Visit: Payer: Self-pay | Admitting: Family Medicine

## 2021-09-17 ENCOUNTER — Other Ambulatory Visit: Payer: Self-pay | Admitting: Family Medicine

## 2021-09-18 MED ORDER — METHYLPHENIDATE HCL ER 20 MG PO TBCR: EXTENDED_RELEASE_TABLET | ORAL | 0 refills | Status: DC

## 2021-10-10 ENCOUNTER — Other Ambulatory Visit: Payer: Self-pay

## 2021-10-10 ENCOUNTER — Encounter: Payer: Self-pay | Admitting: Cardiology

## 2021-10-10 ENCOUNTER — Ambulatory Visit: Payer: BC Managed Care – PPO | Admitting: Cardiology

## 2021-10-10 VITALS — BP 117/76 | HR 71 | Temp 97.8°F | Resp 16 | Ht 64.0 in | Wt 196.8 lb

## 2021-10-10 DIAGNOSIS — E7800 Pure hypercholesterolemia, unspecified: Secondary | ICD-10-CM | POA: Diagnosis not present

## 2021-10-10 DIAGNOSIS — I25118 Atherosclerotic heart disease of native coronary artery with other forms of angina pectoris: Secondary | ICD-10-CM

## 2021-10-10 DIAGNOSIS — I10 Essential (primary) hypertension: Secondary | ICD-10-CM

## 2021-10-10 MED ORDER — ASPIRIN 81 MG PO CHEW: 81.0000 mg | CHEWABLE_TABLET | Freq: Every day | ORAL | Status: DC

## 2021-10-10 NOTE — Progress Notes (Signed)
Subjective:  Primary Physician:  Nestor RampNeal, Sara L, MD  Patient ID: Miranda Baker, female    DOB: 1961/04/06, 61 y.o.   MRN: [id number]  Chief Complaint  Patient presents with   Coronary Artery Disease   Follow-up    1 year    HPI: Miranda Baker  is a 61 y.o. female  with coronary artery disease, coronary angioplasty to the diagonal 1 branch of the LAD  in July 2012. Cardiac catheterization on 01/21/2012 revealing widely patent diagonal stent with mild luminal irregularity in the LAD.  Medical history significant for hypertension, hyperlipidemia. Patient denies palpitations, syncope, near syncope, dizziness, orthopnea, PND.  Past Medical History:  Diagnosis Date   Anxiety    Cellulitis    Complication of anesthesia    Constipation    Coronary artery disease    Stent 2.75x15 Xience 2011   Depression    GERD (gastroesophageal reflux disease)    Hyperlipidemia    Hypertension    Laboratory examination 09/24/2018   MI (mitral incompetence)    Paresthesia 09/24/2018   PONV (postoperative nausea and vomiting) 10/24/2020   Social History   Tobacco Use   Smoking status: Never   Smokeless tobacco: Never  Substance Use Topics   Alcohol use: No  Marital Status: Married   Allergies   Allergies  Allergen Reactions   Penicillins Anaphylaxis   Morphine And Related Nausea And Vomiting and Other (See Comments)   Propofol     Cardiac dysrhthmias after EGD   Bactrim [Sulfamethoxazole-Trimethoprim] Nausea Only    Intolerance only   Erythromycin Nausea And Vomiting and Rash   Tetracyclines & Related Nausea And Vomiting and Rash   Vibramycin [Doxycycline Calcium] Nausea And Vomiting and Rash    Final Medications at End of Visit    Current Outpatient Medications:    albuterol (VENTOLIN HFA) 108 (90 Base) MCG/ACT inhaler, Inhale 1-2 puffs into the lungs every 6 (six) hours as needed for wheezing or shortness of breath., Disp: 8.5 g, Rfl: 0   ALPRAZolam (XANAX) 1 MG tablet, TAKE ONE TABLET  BY MOUTH UP TO 3 TIMES DAILY AS NEEDED FOR ANXIETY, Disp: 90 tablet, Rfl: 3   aspirin (ASPIRIN CHILDRENS) 81 MG chewable tablet, Chew 1 tablet (81 mg total) by mouth daily., Disp: , Rfl:    cholecalciferol (VITAMIN D3) 25 MCG (1000 UT) tablet, Take 1,000 Units by mouth daily., Disp: , Rfl:    citalopram (CELEXA) 40 MG tablet, TAKE 1 TABLET BY MOUTH EVERY DAY, Disp: 90 tablet, Rfl: 2   clotrimazole-betamethasone (LOTRISONE) cream, Apply 1 application topically 2 (two) times daily., Disp: 45 g, Rfl: 1   ezetimibe (ZETIA) 10 MG tablet, TAKE 1 TABLET BY MOUTH EVERY DAY, Disp: 90 tablet, Rfl: 3   icosapent Ethyl (VASCEPA) 1 g capsule, Take 2 capsules (2 g total) by mouth 2 (two) times daily with a meal., Disp: 360 capsule, Rfl: 3   methylphenidate (METADATE ER) 20 MG ER tablet, TAKE ONE TABLET BY MOUTH EACH MORNING AND REPEAT ONCE AT NOON DAILY AS DIRECTED, Disp: 180 tablet, Rfl: 0   metoprolol tartrate (LOPRESSOR) 25 MG tablet, TAKE 1 TABLET BY MOUTH EVERY DAY, Disp: 90 tablet, Rfl: 3   montelukast (SINGULAIR) 10 MG tablet, TAKE 1 TABLET BY MOUTH EVERYDAY AT BEDTIME, Disp: 90 tablet, Rfl: 3   nitroGLYCERIN (NITROSTAT) 0.4 MG SL tablet, Place 1 tablet (0.4 mg total) under the tongue every 5 (five) minutes x 3 doses as needed for chest pain., Disp: 30  tablet, Rfl: 3   olmesartan (BENICAR) 40 MG tablet, TAKE 1 TABLET BY MOUTH EVERY DAY, Disp: 90 tablet, Rfl: 3   PATADAY 0.2 % SOLN, INSTILL ONE DROP INTO THE AFFECTED EYE(S) ONCE DAILY AS DIRECTED (Patient taking differently: 1 drop See admin instructions. In affected eye as needed for allergic conjuctvitis), Disp: 2.5 mL, Rfl: 12   rosuvastatin (CRESTOR) 20 MG tablet, TAKE 1 TABLET BY MOUTH EVERY DAY, Disp: 90 tablet, Rfl: 3   lansoprazole (PREVACID) 30 MG capsule, TAKE ONE TWICE DAILY FOR A WEEK THEN START ONE DAILY BY MOUTH (Patient taking differently: Take 30 mg by mouth daily at 12 noon.), Disp: 37 capsule, Rfl: 0   ROS:  Review of Systems   Cardiovascular:  Negative for chest pain, dyspnea on exertion and leg swelling.   Objective:  Blood pressure 117/76, pulse 71, temperature 97.8 F (36.6 C), temperature source Temporal, resp. rate 16, height 5\' 4"  (1.626 m), weight 196 lb 12.8 oz (89.3 kg), SpO2 96 %. Body mass index is 33.78 kg/m.   Vitals with BMI 10/10/2021 01/15/2021 12/20/2020  Height 5\' 4"  5\' 4"  5\' 4"   Weight 196 lbs 13 oz 194 lbs 3 oz 191 lbs 10 oz  BMI 33.76 33.32 32.87  Systolic 117 117 98  Diastolic 76 58 64  Pulse 71 63 75    Physical Exam Vitals reviewed.  Constitutional:      Appearance: She is well-developed.     Comments: Mildly obese  Neck:     Vascular: No JVD.  Cardiovascular:     Rate and Rhythm: Normal rate and regular rhythm.     Pulses: Intact distal pulses.     Heart sounds: Normal heart sounds, S1 normal and S2 normal. No murmur heard.   No gallop.  Pulmonary:     Effort: Pulmonary effort is normal. No respiratory distress.     Breath sounds: Normal breath sounds. No wheezing, rhonchi or rales.  Abdominal:     General: Bowel sounds are normal.     Palpations: Abdomen is soft.  Musculoskeletal:     Right lower leg: No edema.     Left lower leg: No edema.    Laboratory examination::   CMP Latest Ref Rng & Units 09/29/2020 03/29/2020 11/22/2019  Glucose 65 - 99 mg/dL 86 92 97  BUN 8 - 27 mg/dL 21 14 18   Creatinine 0.57 - 1.00 mg/dL [id number]) 10/01/2020 03/31/2020  Sodium 134 - 144 mmol/L 141 139 138  Potassium 3.5 - 5.2 mmol/L 4.5 4.3 4.9  Chloride 96 - 106 mmol/L 102 101 103  CO2 20 - 29 mmol/L 23 24 27   Calcium 8.7 - 10.3 mg/dL 10.7(H) 10.0 10.3  Total Protein 6.0 - 8.5 g/dL - 6.8 -  Total Bilirubin 0.0 - 1.2 mg/dL - 0.5 -  Alkaline Phos 48 - 121 IU/L - 62 -  AST 0 - 40 IU/L - 22 -  ALT 0 - 32 IU/L - 26 -   CBC Latest Ref Rng & Units 01/13/2019 01/07/2019 01/06/2019  WBC 3.4 - 10.8 x10E3/uL 7.6 6.5 11.3(H)  Hemoglobin 11.1 - 15.9 g/dL 0.25 11.5(L) 11.5(L)  Hematocrit 34.0 - 46.6 % 36.2 36.3  36.2  Platelets 150 - 450 x10E3/uL 274 219 232   Lipid Panel Recent Labs    01/03/21 0800  CHOL 150  TRIG 153*  LDLCALC 62  HDL 62    HEMOGLOBIN A1C Lab Results  Component Value Date   HGBA1C 5.5 09/29/2020   MPG 120 (H)  01/17/2012   TSH No results for input(s): TSH in the last 8760 hours.    Radiology:  No results found.   Cardiac Studies:    Coronary angiogram 01/21/12:  LAD: LAD gives origin to a large diagonal-1. Diagonal one is very large and he is equivalent to the LAD. LAD has mild luminal irregularities. There is a stent (July 2012 2.75x15 mm Xience stent) in the proximal portion of the D1 which is widely patent. The ostium of the D1 and the LAD just after the D1 origin show 20% stenoses. Mid LAD shows mild luminal irregularity. EF 60%  Exercise Sestamibi Stress Test 01/03/2021: Normal ECG stress. The patient exercised for 4 minutes and 19 seconds of a Bruce protocol, achieving approximately 6.22 METs. Markedly reduced exercise tolerance.  The heart rate response was accelerated.  Normal BP response. Myocardial perfusion is normal. Overall LV systolic function is normal without regional wall motion abnormalities. Stress LV EF: 77%. No previous exam available for comparison. Low risk.  Echocardiogram 01/04/2021:  Normal LV systolic function with visual EF 60-65%. Left ventricle cavity is normal in size. Normal global wall motion. Normal diastolic filling pattern, normal LAP.  Trace tricuspid regurgitation. No evidence of pulmonary hypertension.  No prior study for comparison.   EKG:   EKG 10/10/2021: Normal sinus rhythm at rate of 63 bpm, normal axis, nonspecific T abnormality.  No significant changes 12/20/2020.  Assessment     ICD-10-CM   1. Coronary artery disease of native artery of native heart with stable angina pectoris (HCC)  I25.118 EKG 12-Lead    aspirin (ASPIRIN CHILDRENS) 81 MG chewable tablet    2. Primary hypertension  I10     3.  Hypercholesteremia  E78.00       Meds ordered this encounter  Medications   aspirin (ASPIRIN CHILDRENS) 81 MG chewable tablet    Sig: Chew 1 tablet (81 mg total) by mouth daily.   Medications Discontinued During This Encounter  Medication Reason   benzonatate (TESSALON PERLES) 100 MG capsule Completed Course   cefixime (SUPRAX) 400 MG CAPS capsule Completed Course   levofloxacin (LEVAQUIN) 500 MG tablet    lidocaine (XYLOCAINE) 2 % solution    ondansetron (ZOFRAN) 4 MG tablet    terconazole (TERAZOL 7) 0.4 % vaginal cream    Orders Placed This Encounter  Procedures   EKG 12-Lead     Recommendation:    Miranda Baker is a 61 y.o. with coronary artery disease, coronary angioplasty to the diagonal 1 branch of the LAD  in July 2012. Cardiac catheterization on 01/21/2012 revealing widely patent diagonal stent with mild luminal irregularity in the LAD.  Medical history significant for hypertension, hyperlipidemia.  She is presently asymptomatic.  Blood pressure is well controlled and she has not had any recurrence of angina pectoris, lipids previously well controlled on high intensity statins.  She needs repeat lab work including lipids, CMP and CBC and TSH, she has not seen her PCP in quite a while.  I have encouraged her to make an appointment.  I discussed with her regarding weight loss and increasing her physical activity.  Otherwise stable from cardiac standpoint, I will see her back in a year.  I will follow-up on the labs after her PCP visit.   Yates Decamp, PA-C 10/10/2021, 6:02 PM Office: [phone]

## 2021-10-24 ENCOUNTER — Other Ambulatory Visit: Payer: Self-pay

## 2021-10-24 ENCOUNTER — Encounter: Payer: Self-pay | Admitting: Family Medicine

## 2021-10-24 ENCOUNTER — Ambulatory Visit (INDEPENDENT_AMBULATORY_CARE_PROVIDER_SITE_OTHER): Payer: BC Managed Care – PPO | Admitting: Family Medicine

## 2021-10-24 ENCOUNTER — Other Ambulatory Visit (HOSPITAL_COMMUNITY)
Admission: RE | Admit: 2021-10-24 | Discharge: 2021-10-24 | Disposition: A | Discharge status: Home / Self Care | Payer: BC Managed Care – PPO | Source: Ambulatory Visit | Attending: Family Medicine | Admitting: Family Medicine

## 2021-10-24 ENCOUNTER — Other Ambulatory Visit: Payer: Self-pay | Admitting: Family Medicine

## 2021-10-24 VITALS — BP 112/61 | HR 60 | Ht 64.0 in | Wt 196.0 lb

## 2021-10-24 DIAGNOSIS — Z124 Encounter for screening for malignant neoplasm of cervix: Secondary | ICD-10-CM | POA: Diagnosis not present

## 2021-10-24 DIAGNOSIS — Z0000 Encounter for general adult medical examination without abnormal findings: Secondary | ICD-10-CM

## 2021-10-24 DIAGNOSIS — E7800 Pure hypercholesterolemia, unspecified: Secondary | ICD-10-CM

## 2021-10-24 DIAGNOSIS — Z1231 Encounter for screening mammogram for malignant neoplasm of breast: Secondary | ICD-10-CM

## 2021-10-24 DIAGNOSIS — I2510 Atherosclerotic heart disease of native coronary artery without angina pectoris: Secondary | ICD-10-CM

## 2021-10-24 DIAGNOSIS — I10 Essential (primary) hypertension: Secondary | ICD-10-CM | POA: Diagnosis not present

## 2021-10-24 DIAGNOSIS — Z90710 Acquired absence of both cervix and uterus: Secondary | ICD-10-CM

## 2021-10-24 NOTE — Patient Instructions (Signed)
I will send you and Dr. Einar Gip your blood work and pap smear. I recommend getting back in to see your therapsit. ? ?Please call me if anything new comes up. Otherwise I wil see you in 6 months.  ?Great to see you! ?

## 2021-10-25 DIAGNOSIS — Z90710 Acquired absence of both cervix and uterus: Secondary | ICD-10-CM | POA: Insufficient documentation

## 2021-10-25 LAB — COMPREHENSIVE METABOLIC PANEL
ALT: 15 IU/L (ref 0–32)
AST: 15 IU/L (ref 0–40)
Albumin/Globulin Ratio: 2 (ref 1.2–2.2)
Albumin: 4.5 g/dL (ref 3.8–4.8)
Alkaline Phosphatase: 70 IU/L (ref 44–121)
BUN/Creatinine Ratio: 18 (ref 12–28)
BUN: 19 mg/dL (ref 8–27)
Bilirubin Total: 0.4 mg/dL (ref 0.0–1.2)
CO2: 22 mmol/L (ref 20–29)
Calcium: 10.5 mg/dL — ABNORMAL HIGH (ref 8.7–10.3)
Chloride: 106 mmol/L (ref 96–106)
Creatinine, Ser: 1.05 mg/dL — ABNORMAL HIGH (ref 0.57–1.00)
Globulin, Total: 2.3 g/dL (ref 1.5–4.5)
Glucose: 97 mg/dL (ref 70–99)
Potassium: 4.5 mmol/L (ref 3.5–5.2)
Sodium: 142 mmol/L (ref 134–144)
Total Protein: 6.8 g/dL (ref 6.0–8.5)
eGFR: 60 mL/min/{1.73_m2} (ref >59)

## 2021-10-25 LAB — LIPID PANEL
Chol/HDL Ratio: 2.6 ratio (ref 0.0–4.4)
Cholesterol, Total: 149 mg/dL (ref 100–199)
HDL: 58 mg/dL (ref >39)
LDL Chol Calc (NIH): 66 mg/dL (ref 0–99)
Triglycerides: 143 mg/dL (ref 0–149)
VLDL Cholesterol Cal: 25 mg/dL (ref 5–40)

## 2021-10-25 LAB — TSH: TSH: 2.58 u[IU]/mL (ref 0.450–4.500)

## 2021-10-25 LAB — CBC
Hematocrit: 41.3 % (ref 34.0–46.6)
Hemoglobin: 13.4 g/dL (ref 11.1–15.9)
MCH: 28.6 pg (ref 26.6–33.0)
MCHC: 32.4 g/dL (ref 31.5–35.7)
MCV: 88 fL (ref 79–97)
Platelets: 256 10*3/uL (ref 150–450)
RBC: 4.69 x10E6/uL (ref 3.77–5.28)
RDW: 12.8 % (ref 11.7–15.4)
WBC: 6.6 10*3/uL (ref 3.4–10.8)

## 2021-10-25 NOTE — Assessment & Plan Note (Signed)
I agree with her cardiologist that stress is a significant risk factor her for heart disease.  I urged her to get back into counseling. ?

## 2021-10-25 NOTE — Assessment & Plan Note (Signed)
Updated labs today.  She is to get her mammogram.  Discussed exercise.  Discussed getting back into counseling.  Did a Pap smear.  She does not need any future Pap smears secondary to prior hysterectomy. ?

## 2021-10-25 NOTE — Assessment & Plan Note (Signed)
Good control with current medication regimen.  We will get some labs.  Continue current medicines.  Follow-up 6 months. ?

## 2021-10-25 NOTE — Progress Notes (Signed)
? ? ?  CHIEF COMPLAINT / HPI: ?Here for checkup.  Recently saw her cardiologist in the [place] to get some blood work.  She admits to not following up with her own personal health care as regularly as she should.  Continues to be very involved in the caretaking of her husband who is 5 years out from lung transplant.  He has recently been in the hospital at [facility] for 5 weeks.  She continues to work full-time. ? ? ?PERTINENT  PMH / PSH: I have reviewed the patient?s medications, allergies, past medical and surgical history, smoking status and updated in the EMR as appropriate. ? ? ?OBJECTIVE: ? BP 112/61   Pulse 60   Ht 5\' 4"  (1.626 m)   Wt 196 lb (88.9 kg)   BMI 33.64 kg/m?  ? ?Vital signs reviewed. ?GENERAL: Well-developed, well-nourished, no acute distress. ?CARDIOVASCULAR: Regular rate and rhythm no murmur gallop or rub ?LUNGS: Clear to auscultation bilaterally, no rales or wheeze. ?ABDOMEN: Soft positive bowel sounds ?NEURO: No gross focal neurological deficits. ?MSK: Movement of extremity x 4. ?GU: Externally normal.  Uterus and cervix are surgically absent. ?ASSESSMENT / PLAN: ? ? ?HTN (hypertension) ?Good control with current medication regimen.  We will get some labs.  Continue current medicines.  Follow-up 6 months. ? ?Coronary atherosclerosis ?I agree with her cardiologist that stress is a significant risk factor her for heart disease.  I urged her to get back into counseling. ? ?Well adult exam ?Updated labs today.  She is to get her mammogram.  Discussed exercise.  Discussed getting back into counseling.  Did a Pap smear.  She does not need any future Pap smears secondary to prior hysterectomy. ?  ?Dorcas Mcmurray MD ?

## 2021-10-29 ENCOUNTER — Other Ambulatory Visit: Payer: Self-pay

## 2021-10-29 ENCOUNTER — Ambulatory Visit
Admission: RE | Admit: 2021-10-29 | Discharge: 2021-10-29 | Disposition: A | Discharge status: Home / Self Care | Payer: BC Managed Care – PPO | Source: Ambulatory Visit | Attending: Family Medicine | Admitting: Family Medicine

## 2021-10-29 DIAGNOSIS — Z1231 Encounter for screening mammogram for malignant neoplasm of breast: Secondary | ICD-10-CM

## 2021-10-29 LAB — CYTOLOGY - PAP
Comment: NEGATIVE
Diagnosis: NEGATIVE
High risk HPV: NEGATIVE

## 2021-12-18 ENCOUNTER — Ambulatory Visit: Payer: BC Managed Care – PPO | Admitting: Family Medicine

## 2021-12-18 DIAGNOSIS — J029 Acute pharyngitis, unspecified: Secondary | ICD-10-CM | POA: Diagnosis not present

## 2021-12-18 DIAGNOSIS — K219 Gastro-esophageal reflux disease without esophagitis: Secondary | ICD-10-CM | POA: Diagnosis not present

## 2021-12-18 MED ORDER — ACETAMINOPHEN-CODEINE 300-30 MG PO TABS: 1.0000 | ORAL_TABLET | Freq: Every evening | ORAL | 0 refills | Status: AC | PRN

## 2021-12-18 MED ORDER — PANTOPRAZOLE SODIUM 40 MG PO TBEC: 40.0000 mg | DELAYED_RELEASE_TABLET | Freq: Every day | ORAL | 3 refills | Status: DC

## 2021-12-18 MED ORDER — DEXAMETHASONE SODIUM PHOSPHATE 10 MG/ML IJ SOLN: 10.0000 mg | Freq: Once | INTRAMUSCULAR | Status: DC

## 2021-12-18 NOTE — Progress Notes (Addendum)
? ? ?  CHIEF COMPLAINT / HPI: ?Complaining of hoarse voice, sore throat, loss of nasal pressure and postnasal drip for last 2-1/2 weeks.  Does not seem to be getting better.  Coughs a lot but is only mildly productive and is thin clear sputum.  No shortness of breath.  No fevers.  No myalgias or unusual fatigue. ? ?#2.  Additionally she is having increasing reflux symptoms.  She took some of her husbands pantoprazole and that seemed to work better than the previous that she was on.  Would like to switch that would like to get a referral to new GI doctor.  She would like to see Dr. Maryjane Hurter at [facility].  For this reason she does not want any oral steroids for her respiratory issues today if at all possible.  She is afraid to make her reflux worse. ?PERTINENT  PMH / PSH: I have reviewed the patient?s medications, allergies, past medical and surgical history, smoking status and updated in the EMR as appropriate. ?Sinus surgery ? ?OBJECTIVE: ? There were no vitals taken for this visit. ?GENERAL: Well-developed female no acute distress ?Lungs: Clear to auscultation bilaterally with good air movement in all lung fields ?CV: Regular rate and rhythm ?NECK: No lymphadenopathy no thyromegaly. ?OROPHARYNX: Mild erythema with some cobblestoning in the back.  No exudate. ? ?ASSESSMENT / PLAN: ? ?Viral pharyngitis.  She does have a past history of a lot of sinus issues and has had sinus surgery.  We will try her on Decadron injection today, Tylenol 3 at night for cough relief. ?No problem-specific Assessment & Plan notes found for this encounter. ?  ?Dorcas Mcmurray MD ?Lot # [id number] exp 02/2023 dexamethasone 10 mg/ml. Injected 1 ml into left gluteus muscle ?

## 2021-12-18 NOTE — Assessment & Plan Note (Signed)
Switched her to pantoprazole which seems to help her symptoms better.  Will send in referral to Dr. Maryjane Hurter for her request. ?

## 2021-12-18 NOTE — Addendum Note (Signed)
Addended byDenny Levy L on: 12/18/2021 04:10 PM ? ? Modules accepted: Orders ? ?

## 2021-12-24 ENCOUNTER — Encounter: Payer: Self-pay | Admitting: Family Medicine

## 2021-12-31 ENCOUNTER — Other Ambulatory Visit: Payer: Self-pay | Admitting: Family Medicine

## 2022-01-08 ENCOUNTER — Encounter: Payer: Self-pay | Admitting: *Deleted

## 2022-01-24 ENCOUNTER — Other Ambulatory Visit (HOSPITAL_COMMUNITY): Payer: Self-pay | Admitting: Physician Assistant

## 2022-01-24 ENCOUNTER — Other Ambulatory Visit: Payer: Self-pay | Admitting: Physician Assistant

## 2022-01-24 DIAGNOSIS — K219 Gastro-esophageal reflux disease without esophagitis: Secondary | ICD-10-CM

## 2022-02-10 ENCOUNTER — Other Ambulatory Visit: Payer: Self-pay | Admitting: Family Medicine

## 2022-02-18 ENCOUNTER — Ambulatory Visit: Payer: BC Managed Care – PPO | Admitting: Family Medicine

## 2022-02-18 ENCOUNTER — Ambulatory Visit: Payer: Self-pay

## 2022-02-18 VITALS — BP 138/82 | Ht 64.0 in | Wt 199.0 lb

## 2022-02-18 DIAGNOSIS — M222X2 Patellofemoral disorders, left knee: Secondary | ICD-10-CM | POA: Insufficient documentation

## 2022-02-18 DIAGNOSIS — M25562 Pain in left knee: Secondary | ICD-10-CM

## 2022-02-18 NOTE — Progress Notes (Signed)
  [name] - 61 y.o. female MRN [id number]  Date of birth: 19-May-1961  SUBJECTIVE:  Including CC & ROS.  No chief complaint on file.   Miranda Baker is a 61 y.o. female that is  presenting with left knee pain. Occurred about 5 days ago. Anterior in nature.    Review of Systems See HPI   HISTORY: Past Medical, Surgical, Social, and Family History Reviewed & Updated per EMR.   Pertinent Historical Findings include:  Past Medical History:  Diagnosis Date   Anxiety    Cellulitis    Complication of anesthesia    Constipation    Coronary artery disease    Stent 2.75x15 Xience 2011   Depression    GERD (gastroesophageal reflux disease)    Hyperlipidemia    Hypertension    Laboratory examination 09/24/2018   MI (mitral incompetence)    Paresthesia 09/24/2018   PONV (postoperative nausea and vomiting) 10/24/2020    Past Surgical History:  Procedure Laterality Date   COLONOSCOPY WITH ESOPHAGOGASTRODUODENOSCOPY (EGD)     01/05/2019   ESOPHAGOGASTRODUODENOSCOPY N/A 10/07/2012   Procedure: ESOPHAGOGASTRODUODENOSCOPY (EGD);  Surgeon: Willis Modena, MD;  Location: [facility];  Service: Endoscopy;  Laterality: N/A;   HERNIA REPAIR     lap for adhesions     LEFT HEART CATHETERIZATION WITH CORONARY ANGIOGRAM N/A 01/21/2012   Procedure: LEFT HEART CATHETERIZATION WITH CORONARY ANGIOGRAM;  Surgeon: Pamella Pert, MD;  Location: [facility];  Service: Cardiovascular;  Laterality: N/A;   OOPHORECTOMY     SHOULDER ARTHROSCOPY Left 11/25/2019   Procedure: SHOULDER ARTHROSCOPY WITH DEBRIDEMENT EXTENSIVE WITH LYSIS OF ADHESIONS;  Surgeon: Bjorn Pippin, MD;  Location: [facility];  Service: Orthopedics;  Laterality: Left;   SHOULDER CLOSED REDUCTION Left 11/25/2019   Procedure: CLOSED MANIPULATION SHOULDER;  Surgeon: Bjorn Pippin, MD;  Location: [facility];  Service: Orthopedics;  Laterality: Left;   TOTAL ABDOMINAL HYSTERECTOMY  [employer]     PHYSICAL EXAM:   VS: BP 138/82   Ht 5\' 4"  (1.626 m)   Wt 199 lb (90.3 kg)   BMI 34.16 kg/m  Physical Exam Gen: NAD, alert, cooperative with exam, well-appearing MSK:  Neurovascularly intact    Limited ultrasound: left knee:  No effusion  Normal appearing quadricept and patellar tendon  No changes in the medial or lateral meniscus   Summary: no acute structural changes.   Ultrasound and interpretation by , MD    ASSESSMENT & PLAN:   Patellofemoral pain syndrome of left knee Acutely occurring. No structural changes.  - counseled on home exercise therapy and supportive care - counseled on compression  - could consider PT or further imaging.

## 2022-02-18 NOTE — Patient Instructions (Signed)
Good to see you Please try ice  Please try the exercises  You can use ibuprofen or tylenol as needed  You can try compression   Please send me a message in MyChart with any questions or updates.  Please see me back in 4 weeks or as needed if better.   --Dr. Jordan Likes

## 2022-02-18 NOTE — Assessment & Plan Note (Signed)
Acutely occurring. No structural changes.  - counseled on home exercise therapy and supportive care - counseled on compression  - could consider PT or further imaging.

## 2022-02-19 ENCOUNTER — Other Ambulatory Visit: Payer: Self-pay | Admitting: Family Medicine

## 2022-02-19 DIAGNOSIS — E7800 Pure hypercholesterolemia, unspecified: Secondary | ICD-10-CM

## 2022-02-22 ENCOUNTER — Ambulatory Visit: Payer: BC Managed Care – PPO | Admitting: Family Medicine

## 2022-02-27 ENCOUNTER — Other Ambulatory Visit: Payer: Self-pay | Admitting: Family Medicine

## 2022-03-01 MED ORDER — METHYLPHENIDATE HCL ER 20 MG PO TBCR: EXTENDED_RELEASE_TABLET | ORAL | 0 refills | Status: DC

## 2022-03-19 ENCOUNTER — Ambulatory Visit: Payer: Self-pay

## 2022-03-19 ENCOUNTER — Ambulatory Visit (HOSPITAL_BASED_OUTPATIENT_CLINIC_OR_DEPARTMENT_OTHER)
Admission: RE | Admit: 2022-03-19 | Discharge: 2022-03-19 | Disposition: A | Discharge status: Home / Self Care | Payer: BC Managed Care – PPO | Source: Ambulatory Visit | Attending: Family Medicine | Admitting: Family Medicine

## 2022-03-19 ENCOUNTER — Ambulatory Visit: Payer: BC Managed Care – PPO | Admitting: Family Medicine

## 2022-03-19 ENCOUNTER — Encounter: Payer: Self-pay | Admitting: Family Medicine

## 2022-03-19 VITALS — BP 118/80 | Ht 64.0 in | Wt 199.0 lb

## 2022-03-19 DIAGNOSIS — M222X2 Patellofemoral disorders, left knee: Secondary | ICD-10-CM

## 2022-03-19 DIAGNOSIS — M25562 Pain in left knee: Secondary | ICD-10-CM | POA: Diagnosis not present

## 2022-03-19 MED ORDER — TRIAMCINOLONE ACETONIDE 40 MG/ML IJ SUSP
40.0000 mg | Freq: Once | INTRAMUSCULAR | Status: AC
  Administered 2022-03-19: 40 mg via INTRA_ARTICULAR

## 2022-03-19 NOTE — Patient Instructions (Signed)
Good to see you Please use ice  Please try the brace  I will call with the xray results.   Please send me a message in MyChart with any questions or updates.  Please see me back in 3 weeks.   --Dr. Jordan Likes

## 2022-03-19 NOTE — Progress Notes (Signed)
Miranda Baker - 61 y.o. female MRN [id number]  Date of birth: 07-05-1961  SUBJECTIVE:  Including CC & ROS.  No chief complaint on file.   Miranda Baker is a 61 y.o. female that is presenting with worsening of her left knee pain.  Pain is worse at the end of the day.  She has pain over the medial compartment.   Review of Systems See HPI   HISTORY: Past Medical, Surgical, Social, and Family History Reviewed & Updated per EMR.   Pertinent Historical Findings include:  Past Medical History:  Diagnosis Date   Anxiety    Cellulitis    Complication of anesthesia    Constipation    Coronary artery disease    Stent 2.75x15 Xience 2011   Depression    GERD (gastroesophageal reflux disease)    Hyperlipidemia    Hypertension    Laboratory examination 09/24/2018   MI (mitral incompetence)    Paresthesia 09/24/2018   PONV (postoperative nausea and vomiting) 10/24/2020    Past Surgical History:  Procedure Laterality Date   COLONOSCOPY WITH ESOPHAGOGASTRODUODENOSCOPY (EGD)     01/05/2019   ESOPHAGOGASTRODUODENOSCOPY N/A 10/07/2012   Procedure: ESOPHAGOGASTRODUODENOSCOPY (EGD);  Surgeon: Willis Modena, MD;  Location: [facility];  Service: Endoscopy;  Laterality: N/A;   HERNIA REPAIR     lap for adhesions     LEFT HEART CATHETERIZATION WITH CORONARY ANGIOGRAM N/A 01/21/2012   Procedure: LEFT HEART CATHETERIZATION WITH CORONARY ANGIOGRAM;  Surgeon: Pamella Pert, MD;  Location[facility];  Service: Cardiovascular;  Laterality: N/A;   OOPHORECTOMY     SHOULDER ARTHROSCOPY Left 11/25/2019   Procedure: SHOULDER ARTHROSCOPY WITH DEBRIDEMENT EXTENSIVE WITH LYSIS OF ADHESIONS;  Surgeon: Bjorn Pippin, MD;  Location: [facility];  Service: Orthopedics;  Laterality: Left;   SHOULDER CLOSED REDUCTION Left 11/25/2019   Procedure: CLOSED MANIPULATION SHOULDER;  Surgeon: Bjorn Pippin, MD;  Location: [facility];  Service: Orthopedics;  Laterality: Left;   TOTAL  ABDOMINAL HYSTERECTOMY  [employer]     PHYSICAL EXAM:  VS: BP 118/80 (BP Location: Left Arm, Patient Position: Sitting)   Ht 5\' 4"  (1.626 m)   Wt 199 lb (90.3 kg)   BMI 34.16 kg/m  Physical Exam Gen: NAD, alert, cooperative with exam, well-appearing MSK:  Neurovascularly intact     Aspiration/Injection Procedure Note SAHARA FUJIMOTO 1961-04-08  Procedure: Injection Indications: Left knee pain  Procedure Details Consent: Risks of procedure as well as the alternatives and risks of each were explained to the (patient/caregiver).  Consent for procedure obtained. Time Out: Verified patient identification, verified procedure, site/side was marked, verified correct patient position, special equipment/implants available, medications/allergies/relevent history reviewed, required imaging and test results available.  Performed.  The area was cleaned with iodine and alcohol swabs.    The left knee superior lateral suprapatellar pouch was injected using 3 cc of 1% lidocaine on a 21-gauge 1-1/2 inch needle.  The syringe was switched and a mixture containing 1 cc's of 40 mg Kenalog and 4 cc's of 0.25% bupivacaine was injected.  Ultrasound was used. Images were obtained in long views showing the injection.     A sterile dressing was applied.  Patient did tolerate procedure well.     ASSESSMENT & PLAN:   Patellofemoral pain syndrome of left knee Acutely worsening.  No improvement with modalities today and pain is worse at the end of the day. -Counseled on home exercise therapy and supportive care. -X-ray. -Injection. -Knee brace. -Could  consider further imaging.

## 2022-03-19 NOTE — Assessment & Plan Note (Signed)
Acutely worsening.  No improvement with modalities today and pain is worse at the end of the day. -Counseled on home exercise therapy and supportive care. -X-ray. -Injection. -Knee brace. -Could consider further imaging.

## 2022-03-22 ENCOUNTER — Telehealth: Payer: Self-pay | Admitting: Family Medicine

## 2022-03-22 NOTE — Telephone Encounter (Signed)
Informed of results.   Myra Rude, MD [facility] 03/22/2022, 12:10 PM

## 2022-03-29 ENCOUNTER — Ambulatory Visit (HOSPITAL_COMMUNITY)
Admission: RE | Admit: 2022-03-29 | Discharge: 2022-03-29 | Disposition: A | Discharge status: Home / Self Care | Payer: BC Managed Care – PPO | Source: Ambulatory Visit | Attending: Physician Assistant | Admitting: Physician Assistant

## 2022-03-29 DIAGNOSIS — K219 Gastro-esophageal reflux disease without esophagitis: Secondary | ICD-10-CM | POA: Insufficient documentation

## 2022-03-29 DIAGNOSIS — K224 Dyskinesia of esophagus: Secondary | ICD-10-CM | POA: Diagnosis not present

## 2022-03-31 ENCOUNTER — Other Ambulatory Visit: Payer: Self-pay | Admitting: Family Medicine

## 2022-04-05 DIAGNOSIS — R12 Heartburn: Secondary | ICD-10-CM | POA: Diagnosis not present

## 2022-04-05 DIAGNOSIS — K219 Gastro-esophageal reflux disease without esophagitis: Secondary | ICD-10-CM | POA: Diagnosis not present

## 2022-04-18 DIAGNOSIS — K219 Gastro-esophageal reflux disease without esophagitis: Secondary | ICD-10-CM | POA: Diagnosis not present

## 2022-04-25 ENCOUNTER — Other Ambulatory Visit: Payer: Self-pay | Admitting: Family Medicine

## 2022-05-13 DIAGNOSIS — K209 Esophagitis, unspecified without bleeding: Secondary | ICD-10-CM | POA: Diagnosis not present

## 2022-05-13 DIAGNOSIS — K2100 Gastro-esophageal reflux disease with esophagitis, without bleeding: Secondary | ICD-10-CM | POA: Diagnosis not present

## 2022-05-13 DIAGNOSIS — I10 Essential (primary) hypertension: Secondary | ICD-10-CM | POA: Diagnosis not present

## 2022-05-13 DIAGNOSIS — E785 Hyperlipidemia, unspecified: Secondary | ICD-10-CM | POA: Diagnosis not present

## 2022-05-13 DIAGNOSIS — K219 Gastro-esophageal reflux disease without esophagitis: Secondary | ICD-10-CM | POA: Diagnosis not present

## 2022-05-15 DIAGNOSIS — K219 Gastro-esophageal reflux disease without esophagitis: Secondary | ICD-10-CM | POA: Diagnosis not present

## 2022-05-28 ENCOUNTER — Other Ambulatory Visit: Payer: Self-pay | Admitting: Family Medicine

## 2022-05-28 MED ORDER — PREDNISONE 50 MG PO TABS: ORAL_TABLET | ORAL | 0 refills | Status: DC

## 2022-05-28 NOTE — Progress Notes (Signed)
Viral type bronchitis sx with increased wheezing. Will do 5 day steroid burst given her hx. She will let me know if worse. COVID negative at home test.

## 2022-06-06 ENCOUNTER — Other Ambulatory Visit: Payer: Self-pay | Admitting: Family Medicine

## 2022-06-09 ENCOUNTER — Encounter: Payer: Self-pay | Admitting: Family Medicine

## 2022-06-17 ENCOUNTER — Other Ambulatory Visit: Payer: Self-pay | Admitting: Family Medicine

## 2022-06-17 DIAGNOSIS — R052 Subacute cough: Secondary | ICD-10-CM

## 2022-06-18 ENCOUNTER — Ambulatory Visit
Admission: RE | Admit: 2022-06-18 | Discharge: 2022-06-18 | Disposition: A | Discharge status: Home / Self Care | Payer: BC Managed Care – PPO | Source: Ambulatory Visit | Attending: Family Medicine | Admitting: Family Medicine

## 2022-06-18 DIAGNOSIS — R059 Cough, unspecified: Secondary | ICD-10-CM | POA: Diagnosis not present

## 2022-06-18 DIAGNOSIS — R052 Subacute cough: Secondary | ICD-10-CM

## 2022-06-21 MED ORDER — PREDNISONE 50 MG PO TABS: ORAL_TABLET | ORAL | 0 refills | Status: DC

## 2022-08-08 ENCOUNTER — Encounter: Payer: Self-pay | Admitting: Family Medicine

## 2022-08-08 ENCOUNTER — Telehealth: Payer: Self-pay | Admitting: Family Medicine

## 2022-08-08 NOTE — Telephone Encounter (Signed)
Patient stopped by needs refill of meds one is Xanax 1 mg Table and other is Metadate ER 20 mg tab.  Pharmacy she uses is [place] .  Any questions call patient.

## 2022-08-09 MED ORDER — METHYLPHENIDATE HCL ER 20 MG PO TBCR
EXTENDED_RELEASE_TABLET | ORAL | 0 refills | Status: DC
Start: 2022-08-09 — End: 2022-11-25

## 2022-08-09 MED ORDER — ALPRAZOLAM 1 MG PO TABS
ORAL_TABLET | ORAL | 1 refills | Status: DC
Start: 2022-08-09 — End: 2022-11-25

## 2022-09-05 ENCOUNTER — Other Ambulatory Visit: Payer: Self-pay | Admitting: Cardiology

## 2022-09-05 DIAGNOSIS — I25118 Atherosclerotic heart disease of native coronary artery with other forms of angina pectoris: Secondary | ICD-10-CM

## 2022-09-10 DIAGNOSIS — H52203 Unspecified astigmatism, bilateral: Secondary | ICD-10-CM | POA: Diagnosis not present

## 2022-09-10 DIAGNOSIS — H43811 Vitreous degeneration, right eye: Secondary | ICD-10-CM | POA: Diagnosis not present

## 2022-09-10 DIAGNOSIS — H524 Presbyopia: Secondary | ICD-10-CM | POA: Diagnosis not present

## 2022-09-10 DIAGNOSIS — H2513 Age-related nuclear cataract, bilateral: Secondary | ICD-10-CM | POA: Diagnosis not present

## 2022-09-10 DIAGNOSIS — H43392 Other vitreous opacities, left eye: Secondary | ICD-10-CM | POA: Diagnosis not present

## 2022-09-10 DIAGNOSIS — H5203 Hypermetropia, bilateral: Secondary | ICD-10-CM | POA: Diagnosis not present

## 2022-09-24 DIAGNOSIS — K2100 Gastro-esophageal reflux disease with esophagitis, without bleeding: Secondary | ICD-10-CM | POA: Diagnosis not present

## 2022-10-04 ENCOUNTER — Ambulatory Visit (INDEPENDENT_AMBULATORY_CARE_PROVIDER_SITE_OTHER): Payer: BC Managed Care – PPO | Admitting: Family Medicine

## 2022-10-04 ENCOUNTER — Encounter: Payer: Self-pay | Admitting: Family Medicine

## 2022-10-04 ENCOUNTER — Ambulatory Visit
Admission: RE | Admit: 2022-10-04 | Discharge: 2022-10-04 | Disposition: A | Discharge status: Home / Self Care | Payer: BC Managed Care – PPO | Source: Ambulatory Visit | Attending: Family Medicine | Admitting: Family Medicine

## 2022-10-04 VITALS — BP 126/86 | Ht 64.0 in | Wt 195.0 lb

## 2022-10-04 DIAGNOSIS — R490 Dysphonia: Secondary | ICD-10-CM | POA: Diagnosis not present

## 2022-10-04 DIAGNOSIS — S838X1D Sprain of other specified parts of right knee, subsequent encounter: Secondary | ICD-10-CM

## 2022-10-04 DIAGNOSIS — R053 Chronic cough: Secondary | ICD-10-CM

## 2022-10-04 DIAGNOSIS — S838X1S Sprain of other specified parts of right knee, sequela: Secondary | ICD-10-CM

## 2022-10-04 DIAGNOSIS — M25561 Pain in right knee: Secondary | ICD-10-CM | POA: Diagnosis not present

## 2022-10-04 NOTE — Progress Notes (Signed)
    CHIEF COMPLAINT / HPI:  Cough and hoarseness for several months nw. Is seeing GI at [facility] and they have dx her with GERD. GI said her hoarseness and cough were more severe than she should have given her severity of GERD. Recommended she see pulmonary doctor  She would like to be seen at [facility] as she is followed with GI there ( Dr. Dorris Singh) and her husband is followed in lung transplant clinic there.  2. Right knee giving way and feels like it is locking since she moved some furniture 3 weeks ago. Feels unstable   PERTINENT  PMH / PSH: I have reviewed the patient's medications, allergies, past medical and surgical history, smoking status and updated in the EMR as appropriate.   OBJECTIVE:  BP 126/86   Ht '5\' 4"'$  (1.626 m)   Wt 195 lb (88.5 kg)   BMI 33.47 kg/m  Vital signs reviewed. GENERAL: Well-developed, well-nourished, no acute distress. CARDIOVASCULAR: Regular rate and rhythm no murmur gallop or rub LUNGS: Clear to auscultation bilaterally, no rales or wheeze. ABDOMEN: Soft positive bowel sounds NEURO: No gross focal neurological deficits. VOICE: hoarse MSK: KNEE: right: no effusion. Full flexion and extension. Some mild discomfort with varus/valgus testing noted at medial anterior joint line but ligamentously intact. Positive McMurray and Thessaly.    ASSESSMENT / PLAN:  #1.  Cough and voice hoarseness: Several months now.  Initially we thought this was mostly related to reflux disease.  Her gastroenterologist thinks this may have an additional component from the pulmonary system and has asked [place] to refer her to pulmonary.  Referral placed. 2.  Right knee pain consistent with meniscal injury: X-rays today.  Plan MRI. No problem-specific Assessment & Plan notes found for this encounter.   Dorcas Mcmurray MD

## 2022-10-10 DIAGNOSIS — I25118 Atherosclerotic heart disease of native coronary artery with other forms of angina pectoris: Secondary | ICD-10-CM | POA: Diagnosis not present

## 2022-10-11 ENCOUNTER — Ambulatory Visit: Payer: BC Managed Care – PPO | Admitting: Cardiology

## 2022-10-11 ENCOUNTER — Encounter: Payer: Self-pay | Admitting: Cardiology

## 2022-10-11 VITALS — BP 111/65 | HR 63 | Resp 16 | Ht 64.0 in | Wt 199.0 lb

## 2022-10-11 DIAGNOSIS — I10 Essential (primary) hypertension: Secondary | ICD-10-CM

## 2022-10-11 DIAGNOSIS — J4541 Moderate persistent asthma with (acute) exacerbation: Secondary | ICD-10-CM | POA: Diagnosis not present

## 2022-10-11 DIAGNOSIS — E7800 Pure hypercholesterolemia, unspecified: Secondary | ICD-10-CM | POA: Diagnosis not present

## 2022-10-11 DIAGNOSIS — I25118 Atherosclerotic heart disease of native coronary artery with other forms of angina pectoris: Secondary | ICD-10-CM

## 2022-10-11 LAB — LIPID PANEL WITH LDL/HDL RATIO
Cholesterol, Total: 154 mg/dL (ref 100–199)
HDL: 66 mg/dL (ref >39)
LDL Chol Calc (NIH): 47 mg/dL (ref 0–99)
LDL/HDL Ratio: 0.7 ratio (ref 0.0–3.2)
Triglycerides: 267 mg/dL — ABNORMAL HIGH (ref 0–149)
VLDL Cholesterol Cal: 41 mg/dL — ABNORMAL HIGH (ref 5–40)

## 2022-10-11 LAB — CMP14+EGFR
ALT: 19 IU/L (ref 0–32)
AST: 22 IU/L (ref 0–40)
Albumin/Globulin Ratio: 1.9 (ref 1.2–2.2)
Albumin: 4.6 g/dL (ref 3.9–4.9)
Alkaline Phosphatase: 72 IU/L (ref 44–121)
BUN/Creatinine Ratio: 19 (ref 12–28)
BUN: 20 mg/dL (ref 8–27)
Bilirubin Total: 0.5 mg/dL (ref 0.0–1.2)
CO2: 18 mmol/L — ABNORMAL LOW (ref 20–29)
Calcium: 10.6 mg/dL — ABNORMAL HIGH (ref 8.7–10.3)
Chloride: 104 mmol/L (ref 96–106)
Creatinine, Ser: 1.04 mg/dL — ABNORMAL HIGH (ref 0.57–1.00)
Globulin, Total: 2.4 g/dL (ref 1.5–4.5)
Glucose: 88 mg/dL (ref 70–99)
Potassium: 4.4 mmol/L (ref 3.5–5.2)
Sodium: 141 mmol/L (ref 134–144)
Total Protein: 7 g/dL (ref 6.0–8.5)
eGFR: 60 mL/min/{1.73_m2} (ref >59)

## 2022-10-11 LAB — CBC
Hematocrit: 42.7 % (ref 34.0–46.6)
Hemoglobin: 14 g/dL (ref 11.1–15.9)
MCH: 29.2 pg (ref 26.6–33.0)
MCHC: 32.8 g/dL (ref 31.5–35.7)
MCV: 89 fL (ref 79–97)
Platelets: 177 10*3/uL (ref 150–450)
RBC: 4.8 x10E6/uL (ref 3.77–5.28)
RDW: 12.3 % (ref 11.7–15.4)
WBC: 7.6 10*3/uL (ref 3.4–10.8)

## 2022-10-11 MED ORDER — FLUTICASONE FUROATE-VILANTEROL 100-25 MCG/ACT IN AEPB: 1.0000 | INHALATION_SPRAY | Freq: Every day | RESPIRATORY_TRACT | 1 refills | Status: DC

## 2022-10-11 NOTE — Progress Notes (Signed)
FYI

## 2022-10-11 NOTE — Progress Notes (Signed)
Subjective:  Primary Physician:  Dickie La, MD  Patient ID: Miranda Baker, female    DOB: [birth year], 62 y.o.   MRN: [id number]  Chief Complaint  Patient presents with   Coronary Artery Disease   Hyperlipidemia   Hypertension   Follow-up    1 year    HPI: Miranda Baker  is a 62 y.o. female  with coronary artery disease, coronary angioplasty to the diagonal 1 branch of the LAD  in July 2012. Cardiac catheterization on 01/21/2012 revealing widely patent diagonal stent with mild luminal irregularity in the LAD.  Medical history significant for hypertension, hyperlipidemia.   Patient has been doing well from a cardiac standpoint however over the past month or so, she has developed persistent cough and wheezing.  Otherwise no chest pain, no dizziness or syncope.     Past Medical History:  Diagnosis Date   Anxiety    Cellulitis    Complication of anesthesia    Constipation    Coronary artery disease    Stent 2.75x15 Xience 2011   Depression    GERD (gastroesophageal reflux disease)    Hyperlipidemia    Hypertension    Laboratory examination 09/24/2018   MI (mitral incompetence)    Paresthesia 09/24/2018   PONV (postoperative nausea and vomiting) 10/24/2020   Social History   Tobacco Use   Smoking status: Never   Smokeless tobacco: Never  Substance Use Topics   Alcohol use: No  Marital Status: Married   Allergies   Allergies  Allergen Reactions   Penicillins Anaphylaxis   Morphine And Related Nausea And Vomiting and Other (See Comments)   Propofol     Cardiac dysrhthmias after EGD   Bactrim [Sulfamethoxazole-Trimethoprim] Nausea Only    Intolerance only   Erythromycin Nausea And Vomiting and Rash   Tetracyclines & Related Nausea And Vomiting and Rash   Vibramycin [Doxycycline Calcium] Nausea And Vomiting and Rash    Final Medications at End of Visit    Current Outpatient Medications:    albuterol (VENTOLIN HFA) 108 (90 Base) MCG/ACT inhaler, Inhale 1-2 puffs  into the lungs every 6 (six) hours as needed for wheezing or shortness of breath., Disp: 8.5 g, Rfl: 0   ALPRAZolam (XANAX) 1 MG tablet, TAKE ONE TABLET BY MOUTH UP TO 3 TIMES DAILY AS NEEDED FOR ANXIETY, Disp: 90 tablet, Rfl: 1   citalopram (CELEXA) 40 MG tablet, TAKE 1 TABLET BY MOUTH EVERY DAY, Disp: 90 tablet, Rfl: 2   ezetimibe (ZETIA) 10 MG tablet, TAKE 1 TABLET BY MOUTH EVERY DAY, Disp: 90 tablet, Rfl: 3   fluticasone furoate-vilanterol (BREO ELLIPTA) 100-25 MCG/ACT AEPB, Inhale 1 puff into the lungs daily., Disp: 30 each, Rfl: 1   methylphenidate (METADATE ER) 20 MG ER tablet, TAKE ONE TABLET BY MOUTH EACH MORNING AND REPEAT ONCE AT NOON DAILY AS DIRECTED, Disp: 180 tablet, Rfl: 0   metoprolol tartrate (LOPRESSOR) 25 MG tablet, TAKE 1 TABLET BY MOUTH EVERY DAY, Disp: 90 tablet, Rfl: 3   montelukast (SINGULAIR) 10 MG tablet, TAKE 1 TABLET BY MOUTH EVERYDAY AT BEDTIME, Disp: 90 tablet, Rfl: 3   nitroGLYCERIN (NITROSTAT) 0.4 MG SL tablet, Place 1 tablet (0.4 mg total) under the tongue every 5 (five) minutes x 3 doses as needed for chest pain., Disp: 30 tablet, Rfl: 3   olmesartan (BENICAR) 40 MG tablet, TAKE 1 TABLET BY MOUTH EVERY DAY, Disp: 90 tablet, Rfl: 3   PATADAY 0.2 % SOLN, INSTILL ONE DROP INTO THE  AFFECTED EYE(S) ONCE DAILY AS DIRECTED (Patient taking differently: 1 drop See admin instructions. In affected eye as needed for allergic conjuctvitis), Disp: 2.5 mL, Rfl: 12   RABEprazole (ACIPHEX) 20 MG tablet, Take 20 mg by mouth daily., Disp: , Rfl:    rosuvastatin (CRESTOR) 20 MG tablet, TAKE 1 TABLET BY MOUTH EVERY DAY, Disp: 90 tablet, Rfl: 3   traMADol (ULTRAM) 50 MG tablet, Take 50 mg by mouth as needed., Disp: , Rfl:    ROS:  Review of Systems  Cardiovascular:  Negative for chest pain, dyspnea on exertion and leg swelling.  Respiratory:  Positive for cough and wheezing.     Objective:  Blood pressure 111/65, pulse 63, resp. rate 16, height '5\' 4"'$  (1.626 m), weight 199 lb (90.3  kg), SpO2 95 %. Body mass index is 34.16 kg/m.      10/11/2022    1:52 PM 10/04/2022    8:31 AM 03/19/2022    4:08 PM  Vitals with BMI  Height '5\' 4"'$  '5\' 4"'$  '5\' 4"'$   Weight 199 lbs 195 lbs 199 lbs  BMI 34.14 [id number]  Systolic [id number]  Diastolic 65 86 80  Pulse 63      Physical Exam Vitals reviewed.  Constitutional:      Appearance: She is well-developed. She is obese.     Comments: Mildly obese  Neck:     Vascular: No JVD.  Cardiovascular:     Rate and Rhythm: Normal rate and regular rhythm.     Pulses: Intact distal pulses.     Heart sounds: Normal heart sounds, S1 normal and S2 normal. No murmur heard.    No gallop.  Pulmonary:     Effort: Pulmonary effort is normal. No respiratory distress.     Breath sounds: Wheezing (bilateral) present. No rhonchi or rales.  Abdominal:     General: Bowel sounds are normal.     Palpations: Abdomen is soft.  Musculoskeletal:     Right lower leg: No edema.     Left lower leg: No edema.     Laboratory examination::      Latest Ref Rng & Units 10/10/2022    1:14 PM 10/24/2021    9:50 AM 09/29/2020    2:19 PM  CMP  Glucose 70 - 99 mg/dL 88  97  86   BUN 8 - 27 mg/dL '20  19  21   '$ Creatinine 0.57 - 1.00 mg/dL 1.04  1.05  1.04   Sodium 134 - 144 mmol/L 141  142  141   Potassium 3.5 - 5.2 mmol/L 4.4  4.5  4.5   Chloride 96 - 106 mmol/L 104  106  102   CO2 20 - 29 mmol/L '18  22  23   '$ Calcium 8.7 - 10.3 mg/dL 10.6  10.5  10.7   Total Protein 6.0 - 8.5 g/dL 7.0  6.8    Total Bilirubin 0.0 - 1.2 mg/dL 0.5  0.4    Alkaline Phos 44 - 121 IU/L 72  70    AST 0 - 40 IU/L 22  15    ALT 0 - 32 IU/L 19  15        Latest Ref Rng & Units 10/10/2022    1:14 PM 10/24/2021    9:50 AM 01/13/2019    4:43 PM  CBC  WBC 3.4 - 10.8 x10E3/uL 7.6  6.6  7.6   Hemoglobin 11.1 - 15.9 g/dL 14.0  13.4  12.2   Hematocrit 34.0 -  46.6 % 42.7  41.3  36.2   Platelets 150 - 450 x10E3/uL 177  256  274    Lipid Panel Recent Labs    10/24/21 0950  10/10/22 1315  CHOL 149 154  TRIG 143 267*  LDLCALC 66 47  HDL 58 66  CHOLHDL 2.6  --     HEMOGLOBIN A1C Lab Results  Component Value Date   HGBA1C 5.5 09/29/2020   MPG 120 (H) 01/17/2012   TSH Recent Labs    10/24/21 0950  TSH 2.580      Radiology:  No results found.  Cardiac Studies:    Coronary angiogram 01/21/12:  LAD: LAD gives origin to a large diagonal-1. Diagonal one is very large and he is equivalent to the LAD. LAD has mild luminal irregularities. There is a stent (July 2012 2.75x15 mm Xience stent) in the proximal portion of the D1 which is widely patent. The ostium of the D1 and the LAD just after the D1 origin show 20% stenoses. Mid LAD shows mild luminal irregularity. EF 60%  Exercise Sestamibi Stress Test 01/03/2021: Normal ECG stress. The patient exercised for 4 minutes and 19 seconds of a Bruce protocol, achieving approximately 6.22 METs. Markedly reduced exercise tolerance.  The heart rate response was accelerated.  Normal BP response. Myocardial perfusion is normal. Overall LV systolic function is normal without regional wall motion abnormalities. Stress LV EF: 77%. No previous exam available for comparison. Low risk.  Echocardiogram 01/04/2021:  Normal LV systolic function with visual EF 60-65%. Left ventricle cavity is normal in size. Normal global wall motion. Normal diastolic filling pattern, normal LAP.  Trace tricuspid regurgitation. No evidence of pulmonary hypertension.  No prior study for comparison.   EKG:   EKG 10/10/2021: Normal sinus rhythm at rate of 63 bpm, normal axis, nonspecific T abnormality.  No significant changes 12/20/2020.  Assessment     ICD-10-CM   1. Coronary artery disease of native artery of native heart with stable angina pectoris ([place])  I25.118 EKG 12-Lead    2. Primary hypertension  I10     3. Hypercholesteremia  E78.00     4. Moderate persistent reactive airway disease with acute exacerbation  J45.41 fluticasone  furoate-vilanterol (BREO ELLIPTA) 100-25 MCG/ACT AEPB      Meds ordered this encounter  Medications   fluticasone furoate-vilanterol (BREO ELLIPTA) 100-25 MCG/ACT AEPB    Sig: Inhale 1 puff into the lungs daily.    Dispense:  30 each    Refill:  1   Medications Discontinued During This Encounter  Medication Reason   aspirin (ASPIRIN CHILDRENS) 81 MG chewable tablet Patient Preference   Orders Placed This Encounter  Procedures   EKG 12-Lead     Recommendation:    ALLURE IGOE is a 62 y.o. with coronary artery disease, coronary angioplasty to the diagonal 1 branch of the LAD  in July 2012. Cardiac catheterization on 01/21/2012 revealing widely patent diagonal stent with mild luminal irregularity in the LAD.  Medical history significant for hypertension, hyperlipidemia.  1. Coronary artery disease of native artery of native heart with stable angina pectoris [facility]) Patient remains asymptomatic without recurrence of angina pectoris, she has had a low risk test test in 2022.  Continue primary/secondary prevention.  2. Primary hypertension Blood pressure under excellent control, presently on olmesartan.  Patient has developed persistent cough and also wheezing, although she has history of allergy, significant wheezing was evident today and she has been restless due to persistent cough and has tried  multiple steroids and also other therapy without success and has been scheduled for pulmonary consultation.  Advised her to hold olmesartan for 2 weeks to see if her symptoms of cough would improve and to let me know.  In the interim I have prescribed her Adair Patter to see if there would be any improvement.  3. Hypercholesteremia Lipids with regard to LDL is under excellent control, triglycerides are elevated related to poor diet.  Weight loss and exercise discussed with the patient.  If she continues to have elevated triglycerides on her next office visit, will add Vascepa.  4. Moderate  persistent reactive airway disease with acute exacerbation As dictated above prescribed Breo Ellipta.  She has pulmonary consultation pending.    Adrian Prows, MD, [facility] 10/11/2022, 4:26 PM Office: [phone] Fax: [phone] Pager: [phone]

## 2022-10-12 LAB — LDL CHOLESTEROL, DIRECT: LDL Direct: 62 mg/dL (ref 0–99)

## 2022-10-15 DIAGNOSIS — M25561 Pain in right knee: Secondary | ICD-10-CM | POA: Diagnosis not present

## 2022-10-18 ENCOUNTER — Encounter: Payer: Self-pay | Admitting: Family Medicine

## 2022-10-19 ENCOUNTER — Ambulatory Visit
Admission: RE | Admit: 2022-10-19 | Discharge: 2022-10-19 | Disposition: A | Discharge status: Home / Self Care | Payer: BC Managed Care – PPO | Source: Ambulatory Visit | Attending: Family Medicine | Admitting: Family Medicine

## 2022-10-19 DIAGNOSIS — S838X1S Sprain of other specified parts of right knee, sequela: Secondary | ICD-10-CM

## 2022-10-19 DIAGNOSIS — M25561 Pain in right knee: Secondary | ICD-10-CM | POA: Diagnosis not present

## 2022-10-22 DIAGNOSIS — M25561 Pain in right knee: Secondary | ICD-10-CM | POA: Diagnosis not present

## 2022-10-23 ENCOUNTER — Encounter: Payer: Self-pay | Admitting: Family Medicine

## 2022-10-24 ENCOUNTER — Encounter (HOSPITAL_COMMUNITY): Payer: Self-pay | Admitting: Anesthesiology

## 2022-10-24 NOTE — Progress Notes (Addendum)
Reviewed patients chart with Dr. Gifford Shave at [facility]. Patient with new cough, wheezing waiting on pulmonary consult. Patient will need to be seen by pulmonology before having procedure here at [facility]. Lvm with Judeen Hammans at Dr. Wenda Low office

## 2022-10-27 ENCOUNTER — Encounter: Payer: Self-pay | Admitting: Cardiology

## 2022-10-30 ENCOUNTER — Other Ambulatory Visit: Payer: Self-pay | Admitting: Family Medicine

## 2022-10-30 ENCOUNTER — Encounter: Payer: Self-pay | Admitting: Family Medicine

## 2022-10-30 DIAGNOSIS — R058 Other specified cough: Secondary | ICD-10-CM

## 2022-10-31 ENCOUNTER — Encounter (HOSPITAL_BASED_OUTPATIENT_CLINIC_OR_DEPARTMENT_OTHER): Admission: RE | Payer: Self-pay | Source: Home / Self Care

## 2022-10-31 ENCOUNTER — Ambulatory Visit (HOSPITAL_BASED_OUTPATIENT_CLINIC_OR_DEPARTMENT_OTHER)
Admission: RE | Admit: 2022-10-31 | Payer: BC Managed Care – PPO | Source: Home / Self Care | Admitting: Orthopaedic Surgery

## 2022-10-31 SURGERY — ARTHROSCOPY, KNEE, WITH MEDIAL MENISCECTOMY
Anesthesia: General | Site: Knee | Laterality: Right

## 2022-11-15 ENCOUNTER — Other Ambulatory Visit: Payer: Self-pay | Admitting: Family Medicine

## 2022-11-16 ENCOUNTER — Other Ambulatory Visit: Payer: Self-pay | Admitting: Family Medicine

## 2022-11-18 ENCOUNTER — Encounter: Payer: Self-pay | Admitting: *Deleted

## 2022-11-18 ENCOUNTER — Encounter: Payer: Self-pay | Admitting: Pulmonary Disease

## 2022-11-18 ENCOUNTER — Ambulatory Visit: Payer: BC Managed Care – PPO | Admitting: Pulmonary Disease

## 2022-11-18 VITALS — BP 124/72 | HR 61 | Ht 64.0 in | Wt 197.0 lb

## 2022-11-18 DIAGNOSIS — R053 Chronic cough: Secondary | ICD-10-CM | POA: Diagnosis not present

## 2022-11-18 DIAGNOSIS — J4520 Mild intermittent asthma, uncomplicated: Secondary | ICD-10-CM | POA: Diagnosis not present

## 2022-11-18 LAB — CBC WITH DIFFERENTIAL/PLATELET
Basophils Absolute: 0.1 10*3/uL (ref 0.0–0.1)
Basophils Relative: 1 % (ref 0.0–3.0)
Eosinophils Absolute: 0.4 10*3/uL (ref 0.0–0.7)
Eosinophils Relative: 5.7 % — ABNORMAL HIGH (ref 0.0–5.0)
HCT: 41.1 % (ref 36.0–46.0)
Hemoglobin: 13.6 g/dL (ref 12.0–15.0)
Lymphocytes Relative: 32.2 % (ref 12.0–46.0)
Lymphs Abs: 2.4 10*3/uL (ref 0.7–4.0)
MCHC: 33.2 g/dL (ref 30.0–36.0)
MCV: 87 fl (ref 78.0–100.0)
Monocytes Absolute: 0.6 10*3/uL (ref 0.1–1.0)
Monocytes Relative: 8.5 % (ref 3.0–12.0)
Neutro Abs: 3.9 10*3/uL (ref 1.4–7.7)
Neutrophils Relative %: 52.6 % (ref 43.0–77.0)
Platelets: 272 10*3/uL (ref 150.0–400.0)
RBC: 4.72 Mil/uL (ref 3.87–5.11)
RDW: 13.5 % (ref 11.5–15.5)
WBC: 7.5 10*3/uL (ref 4.0–10.5)

## 2022-11-18 NOTE — Patient Instructions (Addendum)
We will check pulmonary function tests at the earliest possible date.  We will schedule you high resolution CT Chest this for further evaluation of your cough and symptoms.   Resume breo ellipta 1 puff daily - rinse mouth out after each use  Use albuterol inhaler as needed 1-2 puffs every 4-6 hours  Start flonase nasal spray, 1 spray per nostril daily  Recommend sleeping upright on a wedge pillow at night for reduction of reflux or elevated the head of your bed with blocks 4-6 inches.   Follow up in 2 months

## 2022-11-18 NOTE — Progress Notes (Signed)
Synopsis: Referred in April 2024 for cough by Denny Levy, MD  Subjective:   PATIENT ID: Miranda Baker GENDER: female DOB: [birth year], MRN: [id number]  HPI  Chief Complaint  Patient presents with   Consult    Referred by PCP for chronic cough and wheezing for the past 8 months.    Miranda Baker is a 62 year old woman, never smoker with history of GERD, hypertension and coronary artery disease who is referred to pulmonary clinic for cough.   10/04/22 note reviewed from primary care team. She reported cough and hoarseness of voice for several months. She was diagnosed with GERD by [facility], who recommended referral to pulmonologist for further evaluation of cough. She is taking pantoprazole 40mg  daily.   She thinks she had RSV infection October 2023. She did 2 rounds of steroids to help her breathing.   Her chest feels heavy, pressure like. Felt similar to gerd. Better with breo, but not alleviated. She has exertional dyspnea.   10/11/22 seen by Dr. Jacinto Halim of Cardiology. Noted to have cough and wheezing on exam. She was prescribed breo ellipta 1 puff daily at this visit. She stopped breo 2 weeks ago. It did help her symptoms, but her cough and wheezing did not resolve.   She is being evaluated for right knee surgery.   She had asthma in childhood, that dissipated. She had second hand smoke in childhood. Smoked for 1 year at age 11. She has 1 dog. Gradnmother died of lung cancer. Her husband is double lung transplant for IPF. She is a Librarian, academic.    Past Medical History:  Diagnosis Date   Anxiety    Cellulitis    Complication of anesthesia    Constipation    Coronary artery disease    Stent 2.75x15 Xience 2011   Depression    GERD (gastroesophageal reflux disease)    Hyperlipidemia    Hypertension    Laboratory examination 09/24/2018   MI (mitral incompetence)    Paresthesia 09/24/2018   PONV (postoperative nausea and vomiting) 10/24/2020     Family History  Problem Relation Age  of Onset   Hypertension Mother    Heart disease Father        3 Stent placement   Prostate cancer Father    Hypertension Father    Heart disease Brother    Hypertension Brother    Hyperlipidemia Brother      Social History   Socioeconomic History   Marital status: Married    Spouse name: Not on file   Number of children: 3   Years of education: Not on file   Highest education level: Not on file  Occupational History   Occupation: Diplomatic Services operational officer: [employer]  Tobacco Use   Smoking status: Never   Smokeless tobacco: Never  Vaping Use   Vaping Use: Never used  Substance and Sexual Activity   Alcohol use: No   Drug use: No   Sexual activity: Not on file  Other Topics Concern   Not on file  Social History Narrative   Not on file   Social Determinants of Health   Financial Resource Strain: Not on file  Food Insecurity: Not on file  Transportation Needs: Not on file  Physical Activity: Not on file  Stress: Not on file  Social Connections: Not on file  Intimate Partner Violence: Not on file     Allergies  Allergen Reactions   Penicillins Anaphylaxis   Morphine And  Related Nausea And Vomiting and Other (See Comments)   Propofol     Cardiac dysrhthmias after EGD   Bactrim [Sulfamethoxazole-Trimethoprim] Nausea Only    Intolerance only   Erythromycin Nausea And Vomiting and Rash   Tetracyclines & Related Nausea And Vomiting and Rash   Vibramycin [Doxycycline Calcium] Nausea And Vomiting and Rash     Outpatient Medications Prior to Visit  Medication Sig Dispense Refill   albuterol (VENTOLIN HFA) 108 (90 Base) MCG/ACT inhaler Inhale 1-2 puffs into the lungs every 6 (six) hours as needed for wheezing or shortness of breath. 8.5 g 0   ezetimibe (ZETIA) 10 MG tablet TAKE 1 TABLET BY MOUTH EVERY DAY 90 tablet 3   fluticasone furoate-vilanterol (BREO ELLIPTA) 100-25 MCG/ACT AEPB Inhale 1 puff into the lungs daily. 30 each 1   metoprolol tartrate  (LOPRESSOR) 25 MG tablet TAKE 1 TABLET BY MOUTH EVERY DAY 90 tablet 3   montelukast (SINGULAIR) 10 MG tablet TAKE 1 TABLET BY MOUTH EVERYDAY AT BEDTIME 90 tablet 3   nitroGLYCERIN (NITROSTAT) 0.4 MG SL tablet Place 1 tablet (0.4 mg total) under the tongue every 5 (five) minutes x 3 doses as needed for chest pain. 30 tablet 3   olmesartan (BENICAR) 40 MG tablet TAKE 1 TABLET BY MOUTH EVERY DAY 90 tablet 3   PATADAY 0.2 % SOLN INSTILL ONE DROP INTO THE AFFECTED EYE(S) ONCE DAILY AS DIRECTED (Patient taking differently: 1 drop See admin instructions. In affected eye as needed for allergic conjuctvitis) 2.5 mL 12   RABEprazole (ACIPHEX) 20 MG tablet Take 20 mg by mouth daily.     rosuvastatin (CRESTOR) 20 MG tablet TAKE 1 TABLET BY MOUTH EVERY DAY 90 tablet 3   ALPRAZolam (XANAX) 1 MG tablet TAKE ONE TABLET BY MOUTH UP TO 3 TIMES DAILY AS NEEDED FOR ANXIETY 90 tablet 1   citalopram (CELEXA) 40 MG tablet TAKE 1 TABLET BY MOUTH EVERY DAY 90 tablet 2   methylphenidate (METADATE ER) 20 MG ER tablet TAKE ONE TABLET BY MOUTH EACH MORNING AND REPEAT ONCE AT NOON DAILY AS DIRECTED 180 tablet 0   pantoprazole (PROTONIX) 40 MG tablet TAKE 1 TABLET (40 MG TOTAL) BY MOUTH 2 (TWO) TIMES DAILY BEFORE MEALS FOR 90 DAYS 180 tablet 3   traMADol (ULTRAM) 50 MG tablet Take 50 mg by mouth as needed.     No facility-administered medications prior to visit.   Review of Systems  Constitutional:  Negative for chills, fever, malaise/fatigue and weight loss.  HENT:  Negative for congestion, sinus pain and sore throat.   Eyes: Negative.   Respiratory:  Positive for cough, shortness of breath and wheezing. Negative for hemoptysis and sputum production.   Cardiovascular:  Negative for chest pain, palpitations, orthopnea, claudication and leg swelling.  Gastrointestinal:  Negative for abdominal pain, heartburn, nausea and vomiting.  Genitourinary: Negative.   Musculoskeletal:  Negative for joint pain and myalgias.  Skin:   Negative for rash.  Neurological:  Negative for weakness.  Endo/Heme/Allergies: Negative.   Psychiatric/Behavioral: Negative.     Objective:   Vitals:   11/18/22 0904  BP: 124/72  Pulse: 61  SpO2: 98%  Weight: 197 lb (89.4 kg)  Height: 5\' 4"  (1.626 m)   Physical Exam Constitutional:      General: She is not in acute distress.    Appearance: She is not ill-appearing.  HENT:     Head: Normocephalic and atraumatic.  Eyes:     General: No scleral icterus.  Conjunctiva/sclera: Conjunctivae normal.     Pupils: Pupils are equal, round, and reactive to light.  Cardiovascular:     Rate and Rhythm: Normal rate and regular rhythm.     Pulses: Normal pulses.     Heart sounds: Normal heart sounds. No murmur heard. Pulmonary:     Effort: Pulmonary effort is normal.     Breath sounds: Normal breath sounds. No wheezing, rhonchi or rales.  Abdominal:     General: Bowel sounds are normal.     Palpations: Abdomen is soft.  Musculoskeletal:     Right lower leg: No edema.     Left lower leg: No edema.  Lymphadenopathy:     Cervical: No cervical adenopathy.  Skin:    General: Skin is warm and dry.  Neurological:     General: No focal deficit present.     Mental Status: She is alert.  Psychiatric:        Mood and Affect: Mood normal.        Behavior: Behavior normal.        Thought Content: Thought content normal.        Judgment: Judgment normal.    CBC    Component Value Date/Time   WBC 7.5 11/18/2022 0944   RBC 4.72 11/18/2022 0944   HGB 13.6 11/18/2022 0944   HGB 14.0 10/10/2022 1314   HCT 41.1 11/18/2022 0944   HCT 42.7 10/10/2022 1314   PLT 272.0 11/18/2022 0944   PLT 177 10/10/2022 1314   MCV 87.0 11/18/2022 0944   MCV 89 10/10/2022 1314   MCH 29.2 10/10/2022 1314   MCH 29.6 01/07/2019 0639   MCHC 33.2 11/18/2022 0944   RDW 13.5 11/18/2022 0944   RDW 12.3 10/10/2022 1314   LYMPHSABS 2.4 11/18/2022 0944   LYMPHSABS 2.7 09/21/2018 1136   MONOABS 0.6 11/18/2022  0944   EOSABS 0.4 11/18/2022 0944   EOSABS 0.3 09/21/2018 1136   BASOSABS 0.1 11/18/2022 0944   BASOSABS 0.1 09/21/2018 1136      Latest Ref Rng & Units 10/10/2022    1:14 PM 10/24/2021    9:50 AM 09/29/2020    2:19 PM  BMP  Glucose 70 - 99 mg/dL 88  97  86   BUN 8 - 27 mg/dL 20  19  21    Creatinine 0.57 - 1.00 mg/dL 4.09  8.11  9.14   BUN/Creat Ratio 12 - 28 19  18  20    Sodium 134 - 144 mmol/L 141  142  141   Potassium 3.5 - 5.2 mmol/L 4.4  4.5  4.5   Chloride 96 - 106 mmol/L 104  106  102   CO2 20 - 29 mmol/L 18  22  23    Calcium 8.7 - 10.3 mg/dL 78.2  95.6  21.3    Chest imaging: CXR 06/18/22 The heart size and mediastinal contours are within normal limits. Both lungs are clear. The visualized skeletal structures are unremarkable.  PFT:    Latest Ref Rng & Units 11/28/2022    3:15 PM  PFT Results  FVC-Pre L 2.50  P  FVC-Predicted Pre % 77  P  FVC-Post L 2.51  P  FVC-Predicted Post % 77  P  Pre FEV1/FVC % % 84  P  Post FEV1/FCV % % 82  P  FEV1-Pre L 2.11  P  FEV1-Predicted Pre % 85  P  FEV1-Post L 2.05  P  DLCO uncorrected ml/min/mmHg 21.23  P  DLCO UNC% % 106  P  DLCO corrected ml/min/mmHg 21.10  P  DLCO COR %Predicted % 105  P  DLVA Predicted % 134  P  TLC L 5.34  P  TLC % Predicted % 105  P  RV % Predicted % 131  P    P Preliminary result    Labs:  Path:  Echo 01/04/21: LV EF 60-65%. Normal diastolic filling. No evidence of pulmonary hypertension.   Heart Catheterization:  Assessment & Plan:   Chronic cough - Plan: CT CHEST HIGH RESOLUTION  Mild intermittent reactive airway disease without complication - Plan: Pulmonary Function Test, CBC with Differential/Platelet, IgE, IgE, CBC with Differential/Platelet  Discussion: Miranda Baker is a 62 year old woman, never smoker with history of GERD, hypertension and coronary artery disease who is referred to pulmonary clinic for cough.   She has chronic cough along with wheezing and chest tightness  concerning for intermittent reactive airways disease most likely from her recent viral infection last fall.   She is to resume breo ellipta 1 puff daily and use albuterol inhaler as needed.   She is to start flonase nasal spray for post nasal drainage.   Recommend sleeping upright with a wedge pillow to reduce nocturnal reflux.  We will check pulmonary function tests.  Follow up in 2 months.   Melody Comas, MD [facility] Office: [phone]     Current Outpatient Medications:    albuterol (VENTOLIN HFA) 108 (90 Base) MCG/ACT inhaler, Inhale 1-2 puffs into the lungs every 6 (six) hours as needed for wheezing or shortness of breath., Disp: 8.5 g, Rfl: 0   ezetimibe (ZETIA) 10 MG tablet, TAKE 1 TABLET BY MOUTH EVERY DAY, Disp: 90 tablet, Rfl: 3   fluticasone furoate-vilanterol (BREO ELLIPTA) 100-25 MCG/ACT AEPB, Inhale 1 puff into the lungs daily., Disp: 30 each, Rfl: 1   metoprolol tartrate (LOPRESSOR) 25 MG tablet, TAKE 1 TABLET BY MOUTH EVERY DAY, Disp: 90 tablet, Rfl: 3   montelukast (SINGULAIR) 10 MG tablet, TAKE 1 TABLET BY MOUTH EVERYDAY AT BEDTIME, Disp: 90 tablet, Rfl: 3   nitroGLYCERIN (NITROSTAT) 0.4 MG SL tablet, Place 1 tablet (0.4 mg total) under the tongue every 5 (five) minutes x 3 doses as needed for chest pain., Disp: 30 tablet, Rfl: 3   olmesartan (BENICAR) 40 MG tablet, TAKE 1 TABLET BY MOUTH EVERY DAY, Disp: 90 tablet, Rfl: 3   PATADAY 0.2 % SOLN, INSTILL ONE DROP INTO THE AFFECTED EYE(S) ONCE DAILY AS DIRECTED (Patient taking differently: 1 drop See admin instructions. In affected eye as needed for allergic conjuctvitis), Disp: 2.5 mL, Rfl: 12   RABEprazole (ACIPHEX) 20 MG tablet, Take 20 mg by mouth daily., Disp: , Rfl:    rosuvastatin (CRESTOR) 20 MG tablet, TAKE 1 TABLET BY MOUTH EVERY DAY, Disp: 90 tablet, Rfl: 3   ALPRAZolam (XANAX) 1 MG tablet, TAKE ONE TABLET BY MOUTH UP TO 3 TIMES DAILY AS NEEDED FOR ANXIETY, Disp: 90 tablet, Rfl:  1   citalopram (CELEXA) 40 MG tablet, TAKE 1 TABLET BY MOUTH EVERY DAY, Disp: 90 tablet, Rfl: 2   fluticasone (FLONASE) 50 MCG/ACT nasal spray, Place 1 spray into both nostrils daily., Disp: 16 g, Rfl: 11   methylphenidate (METADATE ER) 20 MG ER tablet, TAKE ONE TABLET BY MOUTH EACH MORNING AND REPEAT ONCE AT NOON DAILY AS DIRECTED, Disp: 180 tablet, Rfl: 0   traMADol (ULTRAM) 50 MG tablet, Take 1 tablet (50 mg total) by mouth as needed., Disp: 30 tablet, Rfl: 3

## 2022-11-19 ENCOUNTER — Encounter: Payer: Self-pay | Admitting: Pulmonary Disease

## 2022-11-19 LAB — IGE: IgE (Immunoglobulin E), Serum: 79 kU/L (ref <=114)

## 2022-11-19 MED ORDER — FLUTICASONE PROPIONATE 50 MCG/ACT NA SUSP: 1.0000 | Freq: Every day | NASAL | 11 refills | Status: DC

## 2022-11-25 ENCOUNTER — Other Ambulatory Visit: Payer: Self-pay | Admitting: Family Medicine

## 2022-11-25 ENCOUNTER — Ambulatory Visit (HOSPITAL_BASED_OUTPATIENT_CLINIC_OR_DEPARTMENT_OTHER)
Admission: RE | Admit: 2022-11-25 | Discharge: 2022-11-25 | Disposition: A | Discharge status: Home / Self Care | Payer: BC Managed Care – PPO | Source: Ambulatory Visit | Attending: Pulmonary Disease | Admitting: Pulmonary Disease

## 2022-11-25 ENCOUNTER — Encounter: Payer: Self-pay | Admitting: Family Medicine

## 2022-11-25 DIAGNOSIS — I700 Atherosclerosis of aorta: Secondary | ICD-10-CM | POA: Insufficient documentation

## 2022-11-25 DIAGNOSIS — R053 Chronic cough: Secondary | ICD-10-CM | POA: Diagnosis not present

## 2022-11-25 DIAGNOSIS — I2510 Atherosclerotic heart disease of native coronary artery without angina pectoris: Secondary | ICD-10-CM | POA: Diagnosis not present

## 2022-11-25 MED ORDER — METHYLPHENIDATE HCL ER 20 MG PO TBCR: EXTENDED_RELEASE_TABLET | ORAL | 0 refills | Status: DC

## 2022-11-25 MED ORDER — ALPRAZOLAM 1 MG PO TABS: ORAL_TABLET | ORAL | 1 refills | Status: DC

## 2022-11-26 MED ORDER — TRAMADOL HCL 50 MG PO TABS: 50.0000 mg | ORAL_TABLET | ORAL | 3 refills | Status: DC | PRN

## 2022-11-28 ENCOUNTER — Telehealth (HOSPITAL_BASED_OUTPATIENT_CLINIC_OR_DEPARTMENT_OTHER): Payer: Self-pay | Admitting: Pulmonary Disease

## 2022-11-28 ENCOUNTER — Ambulatory Visit (INDEPENDENT_AMBULATORY_CARE_PROVIDER_SITE_OTHER): Payer: BC Managed Care – PPO | Admitting: Pulmonary Disease

## 2022-11-28 DIAGNOSIS — J4520 Mild intermittent asthma, uncomplicated: Secondary | ICD-10-CM

## 2022-11-28 LAB — PULMONARY FUNCTION TEST
DL/VA % pred: 134 %
DL/VA: 5.64 ml/min/mmHg/L
DLCO cor % pred: 105 %
DLCO cor: 21.1 ml/min/mmHg
DLCO unc % pred: 106 %
DLCO unc: 21.23 ml/min/mmHg
FEF 25-75 Post: 2.21 L/sec
FEF 25-75 Pre: 2.68 L/sec
FEF2575-%Change-Post: -17 %
FEF2575-%Pred-Post: 99 %
FEF2575-%Pred-Pre: 120 %
FEV1-%Change-Post: -2 %
FEV1-%Pred-Post: 83 %
FEV1-%Pred-Pre: 85 %
FEV1-Post: 2.05 L
FEV1-Pre: 2.11 L
FEV1FVC-%Change-Post: -2 %
FEV1FVC-%Pred-Pre: 109 %
FEV6-%Change-Post: 0 %
FEV6-%Pred-Post: 80 %
FEV6-%Pred-Pre: 80 %
FEV6-Post: 2.5 L
FEV6-Pre: 2.49 L
FEV6FVC-%Pred-Post: 103 %
FEV6FVC-%Pred-Pre: 103 %
FVC-%Change-Post: 0 %
FVC-%Pred-Post: 77 %
FVC-%Pred-Pre: 77 %
FVC-Post: 2.51 L
FVC-Pre: 2.5 L
Post FEV1/FVC ratio: 82 %
Post FEV6/FVC ratio: 100 %
Pre FEV1/FVC ratio: 84 %
Pre FEV6/FVC Ratio: 100 %
RV % pred: 131 %
RV: 2.68 L
TLC % pred: 105 %
TLC: 5.34 L

## 2022-11-28 NOTE — Telephone Encounter (Signed)
Patient stated after her breathing test her surgeon requested that Dr. Francine Graven look at her pulmonary functions tests preformed today and clear her for surgery. Please advise.

## 2022-11-28 NOTE — Progress Notes (Signed)
Full PFT Performed Today  

## 2022-11-28 NOTE — Patient Instructions (Signed)
Full PFT Performed Today  

## 2022-11-29 NOTE — Telephone Encounter (Signed)
ATC X1 LVM for patient to call the office back 

## 2022-11-29 NOTE — Telephone Encounter (Signed)
PT ret call. Pls try again. 

## 2022-11-29 NOTE — Telephone Encounter (Signed)
Called the pt back and there was no answer- LMTCB.  ?

## 2022-12-02 ENCOUNTER — Encounter: Payer: Self-pay | Admitting: Pulmonary Disease

## 2022-12-02 NOTE — Telephone Encounter (Signed)
Pt returning missed call. 

## 2022-12-03 ENCOUNTER — Encounter: Payer: Self-pay | Admitting: Pulmonary Disease

## 2022-12-03 NOTE — Telephone Encounter (Signed)
Mychart message sent by pt:  [facility] (supporting Martina Sinner, MD)20 hours ago (7:44 PM)    Dr Francine Graven,  I have been trying to reach the office and get an update on my test results and see if you received the paperwork from [facility] and [facility] for the clearance to have my knee surgery.   I have made several calls and I get no response. Please call me and give me an update. Thank you, [name]    Routing this to both Dr. Francine Graven for him to review recent testing that pt has had done and also routing to surgical clearance pool to see if the clearance from [facility] and [facility] was received.

## 2022-12-04 ENCOUNTER — Other Ambulatory Visit: Payer: Self-pay | Admitting: Cardiology

## 2022-12-04 DIAGNOSIS — J4541 Moderate persistent asthma with (acute) exacerbation: Secondary | ICD-10-CM

## 2022-12-04 NOTE — Telephone Encounter (Signed)
Closing encounter. Message has been handled in my chart

## 2022-12-18 DIAGNOSIS — R159 Full incontinence of feces: Secondary | ICD-10-CM | POA: Diagnosis not present

## 2022-12-18 DIAGNOSIS — K2100 Gastro-esophageal reflux disease with esophagitis, without bleeding: Secondary | ICD-10-CM | POA: Diagnosis not present

## 2022-12-18 NOTE — Progress Notes (Signed)
Your CT Chest scan is unremarkable. No concerning findings within the lungs.

## 2022-12-19 ENCOUNTER — Other Ambulatory Visit: Payer: Self-pay

## 2022-12-19 ENCOUNTER — Encounter (HOSPITAL_BASED_OUTPATIENT_CLINIC_OR_DEPARTMENT_OTHER): Payer: Self-pay | Admitting: Orthopaedic Surgery

## 2022-12-19 NOTE — Progress Notes (Signed)
Pt denies any coughing, wheezing, or chest tightness in the past 4 weeks.

## 2022-12-25 NOTE — Discharge Instructions (Signed)
Ramond Marrow MD, MPH Alfonse Alpers, PA-C [facility] [address] [phone] (tel)   [phone] (fax)   POST-OPERATIVE INSTRUCTIONS - Knee Arthroscopy  WOUND CARE - You may remove the Operative Dressing on Post-Op Day #3 (72hrs after surgery).   -  Alternatively if you would like you can leave dressing on until follow-up if within 7-8 days but keep it dry. - Leave steri-strips in place until they fall off on their own, usually 2 weeks postop. - An ACE wrap may be used to control swelling, do not wrap this too tight.  If the initial ACE wrap feels too tight you may loosen it. - There may be a small amount of fluid/bleeding leaking at the surgical site.  - This is normal; the knee is filled with fluid during the procedure and can leak for 24-48hrs after surgery. You may change/reinforce the bandage as needed.  - Use the Cryocuff or Ice as often as possible for the first 7 days, then as needed for pain relief. Always keep a towel, ACE wrap or other barrier between the cooling unit and your skin.  - You may shower on Post-Op Day #3. Gently pat the area dry.  - Do not soak the knee in water or submerge it.  - Do not go swimming in the pool or ocean until 4 weeks after surgery or when otherwise instructed.  Keep dry incisions as dry as possible.   BRACE/AMBULATION  -            You will not need a brace after this procedure.   - You may use crutches initially to help you weight bear, but this is not required - You can put full weight on your operative leg as you feel comfortable  PHYSICAL THERAPY - You will begin physical therapy soon after surgery (unless otherwise specified) - Please call to set up an appointment, if you do not already have one  - Let our office if there are any issues with scheduling your therapy  - You have a physical therapy appointment scheduled at [facility] (across the hall from our office) on Tuesday May 28th    REGIONAL  ANESTHESIA (NERVE BLOCKS) The anesthesia team may have performed a nerve block for you this is a great tool used to minimize pain.   The block may start wearing off overnight (between 8-24 hours postop) When the block wears off, your pain may go from nearly zero to the pain you would have had postop without the block. This is an abrupt transition but nothing dangerous is happening.   This can be a challenging period but utilize your as needed pain medications to try and manage this period. We suggest you use the pain medication the first night prior to going to bed, to ease this transition.  You may take an extra dose of narcotic when this happens if needed   POST-OP MEDICATIONS- Multimodal approach to pain control In general your pain will be controlled with a combination of substances.  Prescriptions unless otherwise discussed are electronically sent to your pharmacy.  This is a carefully made plan we use to minimize narcotic use.     Celebrex - Anti-inflammatory medication taken on a scheduled basis Acetaminophen - Non-narcotic pain medicine taken on a scheduled basis  Tramadol - This is a strong narcotic, to be used only on an "as needed" basis for SEVERE pain. Aspirin 81mg  - This medicine is used to minimize the risk of  blood clots after surgery. Zofran - take as needed for nausea   FOLLOW-UP   Please call the office to schedule a follow-up appointment for your incision check, 7-10 days post-operatively.   IF YOU HAVE ANY QUESTIONS, PLEASE FEEL FREE TO CALL OUR OFFICE.   HELPFUL INFORMATION   Keep your leg elevated to decrease swelling, which will then in turn decrease your pain. I would elevate the foot of your bed by putting a couple of couch pillows between your mattress and box spring. I would not keep pillow directly under your ankle.  - Do not sleep with a pillow behind your knee even if it is more comfortable as this may make it harder to get your knee fully straight long  term.   There will be MORE swelling on days 1-3 than there is on the day of surgery.  This also is normal. The swelling will decrease with the anti-inflammatory medication, ice and keeping it elevated. The swelling will make it more difficult to bend your knee. As the swelling goes down your motion will become easier   You may develop swelling and bruising that extends from your knee down to your calf and perhaps even to your foot over the next week. Do not be alarmed. This too is normal, and it is due to gravity   There may be some numbness adjacent to the incision site. This may last for 6-12 months or longer in some patients and is expected.   You may return to sedentary work/school in the next couple of days when you feel up to it. You will need to keep your leg elevated as much as possible    You should wean off your narcotic medicines as soon as you are able.  Most patients will be off narcotics before their first postop appointment.    We suggest you use the pain medication the first night prior to going to bed, in order to ease any pain when the anesthesia wears off. You should avoid taking pain medications on an empty stomach as it will make you nauseous.   Do not drink alcoholic beverages or take illicit drugs when taking pain medications.   It is against the law to drive while taking narcotics. You cannot drive if your Right leg is in brace locked in extension.   Pain medication may make you constipated.  Below are a few solutions to try in this order:  o Decrease the amount of pain medication if you aren't having pain.  o Drink lots of decaffeinated fluids.  o Drink prune juice and/or eat dried prunes   o If the first 3 don't work start with additional solutions  o Take Colace - an over-the-counter stool softener  o Take Senokot - an over-the-counter laxative  o Take Miralax - a stronger over-the-counter laxative    For more information including helpful videos and  documents visit our website:   [email or web address]        Post Anesthesia Home Care Instructions  Activity: Get plenty of rest for the remainder of the day. A responsible individual must stay with you for 24 hours following the procedure.  For the next 24 hours, DO NOT: -Drive a car -Advertising copywriter -Drink alcoholic beverages -Take any medication unless instructed by your physician -Make any legal decisions or sign important papers.  Meals: Start with liquid foods such as gelatin or soup. Progress to regular foods as tolerated. Avoid greasy, spicy, heavy foods. If nausea and/or vomiting occur, drink  only clear liquids until the nausea and/or vomiting subsides. Call your physician if vomiting continues.  Special Instructions/Symptoms: Your throat may feel dry or sore from the anesthesia or the breathing tube placed in your throat during surgery. If this causes discomfort, gargle with warm salt water. The discomfort should disappear within 24 hours.  If you had a scopolamine patch placed behind your ear for the management of post- operative nausea and/or vomiting:  1. The medication in the patch is effective for 72 hours, after which it should be removed.  Wrap patch in a tissue and discard in the trash. Wash hands thoroughly with soap and water. 2. You may remove the patch earlier than 72 hours if you experience unpleasant side effects which may include dry mouth, dizziness or visual disturbances. 3. Avoid touching the patch. Wash your hands with soap and water after contact with the patch.       No tylenol until 330pm

## 2022-12-25 NOTE — H&P (Signed)
PREOPERATIVE H&P  Chief Complaint: right knee medial meniscus tear, synovitis  HPI: Miranda Baker is a 62 y.o. female who is scheduled for, Procedure(s): KNEE ARTHROSCOPY WITH MEDIAL MENISECTOMY SYNOVECTOMY.   Patient has a past medical history significant for HTN, GERD, HLD, PONV, CAD.   Patient is a pleasant 62 year-old female who was referred here by Dr. Jennette Kettle from [facility] for evaluation of right knee pain.  She has had knee pain for several months now.  Sharp shooting pain on the inside part of the knee, especially with twisting or turning movements.  It hurts when she is bearing weight and twisting, especially if she is getting in and out of her car.    Symptoms are rated as moderate to severe, and have been worsening.  This is significantly impairing activities of daily living.    Please see clinic note for further details on this patient's care.    She has elected for surgical management.   Past Medical History:  Diagnosis Date   Anxiety    Cellulitis    Complication of anesthesia    propofol caused dysrhytmias and patient had to be admitted   Constipation    Coronary artery disease    Stent 2.75x15 Xience 2011   Depression    GERD (gastroesophageal reflux disease)    Hyperlipidemia    Hypertension    Laboratory examination 09/24/2018   MI (mitral incompetence)    Paresthesia 09/24/2018   PONV (postoperative nausea and vomiting) 10/24/2020   Past Surgical History:  Procedure Laterality Date   COLONOSCOPY WITH ESOPHAGOGASTRODUODENOSCOPY (EGD)     01/05/2019   ESOPHAGOGASTRODUODENOSCOPY N/A 10/07/2012   Procedure: ESOPHAGOGASTRODUODENOSCOPY (EGD);  Surgeon: Willis Modena, MD;  Location: [facility];  Service: Endoscopy;  Laterality: N/A;   HERNIA REPAIR     lap for adhesions     LEFT HEART CATHETERIZATION WITH CORONARY ANGIOGRAM N/A 01/21/2012   Procedure: LEFT HEART CATHETERIZATION WITH CORONARY ANGIOGRAM;  Surgeon: Pamella Pert, MD;   Location[facility];  Service: Cardiovascular;  Laterality: N/A;   OOPHORECTOMY     SHOULDER ARTHROSCOPY Left 11/25/2019   Procedure: SHOULDER ARTHROSCOPY WITH DEBRIDEMENT EXTENSIVE WITH LYSIS OF ADHESIONS;  Surgeon: Bjorn Pippin, MD;  Location: [facility];  Service: Orthopedics;  Laterality: Left;   SHOULDER CLOSED REDUCTION Left 11/25/2019   Procedure: CLOSED MANIPULATION SHOULDER;  Surgeon: Bjorn Pippin, MD;  Location: [facility];  Service: Orthopedics;  Laterality: Left;   TOTAL ABDOMINAL HYSTERECTOMY  [employer]   Social History   Socioeconomic History   Marital status: Married    Spouse name: Not on file   Number of children: 3   Years of education: Not on file   Highest education level: Not on file  Occupational History   Occupation: Geographical information systems officer    Employer: [employer]  Tobacco Use   Smoking status: Never   Smokeless tobacco: Never  Vaping Use   Vaping Use: Never used  Substance and Sexual Activity   Alcohol use: No   Drug use: No   Sexual activity: Not on file  Other Topics Concern   Not on file  Social History Narrative   Not on file   Social Determinants of Health   Financial Resource Strain: Not on file  Food Insecurity: Not on file  Transportation Needs: Not on file  Physical Activity: Not on file  Stress: Not on file  Social Connections: Not on file   Family History  Problem Relation Age of Onset   Hypertension Mother    Heart disease Father        3 Stent placement   Prostate cancer Father    Hypertension Father    Heart disease Brother    Hypertension Brother    Hyperlipidemia Brother    Allergies  Allergen Reactions   Penicillins Anaphylaxis   Morphine And Codeine Nausea And Vomiting and Other (See Comments)   Propofol     Cardiac dysrhthmias after EGD   Bactrim [Sulfamethoxazole-Trimethoprim] Nausea Only    Intolerance only   Erythromycin Nausea And Vomiting and Rash   Tetracyclines & Related  Nausea And Vomiting and Rash   Vibramycin [Doxycycline Calcium] Nausea And Vomiting and Rash   Prior to Admission medications   Medication Sig Start Date End Date Taking? Authorizing Provider  ALPRAZolam (XANAX) 1 MG tablet TAKE ONE TABLET BY MOUTH UP TO 3 TIMES DAILY AS NEEDED FOR ANXIETY 11/25/22  Yes [name], MD  BREO ELLIPTA 100-25 MCG/ACT AEPB TAKE 1 PUFF BY MOUTH EVERY DAY 12/05/22  Yes Yates Decamp, MD  citalopram (CELEXA) 40 MG tablet TAKE 1 TABLET BY MOUTH EVERY DAY Patient taking differently: at bedtime. 11/18/22  Yes [name], MD  ezetimibe (ZETIA) 10 MG tablet TAKE 1 TABLET BY MOUTH EVERY DAY 11/15/22  Yes [name], MD  fluticasone [facility]) 50 MCG/ACT nasal spray Place 1 spray into both nostrils daily. 11/19/22  Yes Martina Sinner, MD  methylphenidate (METADATE ER) 20 MG ER tablet TAKE ONE TABLET BY MOUTH EACH MORNING AND REPEAT ONCE AT NOON DAILY AS DIRECTED 11/25/22  Yes [name], MD  metoprolol tartrate (LOPRESSOR) 25 MG tablet TAKE 1 TABLET BY MOUTH EVERY DAY 04/25/22  Yes Pray, Milus Mallick, MD  montelukast (SINGULAIR) 10 MG tablet TAKE 1 TABLET BY MOUTH EVERYDAY AT BEDTIME 06/08/22  Yes [name], MD  olmesartan (BENICAR) 40 MG tablet TAKE 1 TABLET BY MOUTH EVERY DAY 06/08/22  Yes [name], MD  PATADAY 0.2 % SOLN INSTILL ONE DROP INTO THE AFFECTED EYE(S) ONCE DAILY AS DIRECTED Patient taking differently: 1 drop See admin instructions. In affected eye as needed for allergic conjuctvitis 10/10/16  Yes [name], MD  RABEprazole (ACIPHEX) 20 MG tablet Take 20 mg by mouth daily. 09/24/22 09/24/23 Yes [provider]  rosuvastatin (CRESTOR) 20 MG tablet TAKE 1 TABLET BY MOUTH EVERY DAY 02/20/22  Yes [name], MD  albuterol (VENTOLIN HFA) 108 (90 Base) MCG/ACT inhaler Inhale 1-2 puffs into the lungs every 6 (six) hours as needed for wheezing or shortness of breath. 07/29/21   Freddy Finner, NP  nitroGLYCERIN (NITROSTAT) 0.4 MG SL tablet Place 1 tablet (0.4  mg total) under the tongue every 5 (five) minutes x 3 doses as needed for chest pain. 07/31/15 02/12/26  [name], MD  traMADol (ULTRAM) 50 MG tablet Take 1 tablet (50 mg total) by mouth as needed. 11/26/22   [name], MD    ROS: All other systems have been reviewed and were otherwise negative with the exception of those mentioned in the HPI and as above.  Physical Exam: General: Alert, no acute distress Cardiovascular: No pedal edema Respiratory: No cyanosis, no use of accessory musculature GI: No organomegaly, abdomen is soft and non-tender Skin: No lesions in the area of chief complaint Neurologic: Sensation intact distally Psychiatric: Patient is competent for consent with normal mood and affect Lymphatic: No axillary or cervical lymphadenopathy  MUSCULOSKELETAL:  On exam today she is sitting on the exam table in no acute distress.  Right knee has no obvious effusion.  Non-tender to palpation at the lateral joint line.  Very point tender to palpation at the medial joint line.  She is non-tender to palpation over the patella.  She has full range of motion of the knee in flexion and extension.  She has 5/5 strength throughout.  She has no ligamentous instability with valgus or varus stressing.  Firm end point on Lachman.  She has a positive McMurray test.  Positive Thessaly test.    Imaging: MRI demonstrates a medial meniscus tear.  There is mild tricompartmental change.  Assessment: right knee medial meniscus tear, synovitis  Plan: Plan for Procedure(s): KNEE ARTHROSCOPY WITH MEDIAL MENISECTOMY SYNOVECTOMY  The risks benefits and alternatives were discussed with the patient including but not limited to the risks of nonoperative treatment, versus surgical intervention including infection, bleeding, nerve injury,  blood clots, cardiopulmonary complications, morbidity, mortality, among others, and they were willing to proceed.   The patient acknowledged the explanation, agreed  to proceed with the plan and consent was signed.   Operative Plan: Right knee scope with medial meniscectomy  Discharge Medications: standard DVT Prophylaxis: aspirin Physical Therapy: outpatient PT Special Discharge needs: +/-   Vernetta Honey, PA-C  12/25/2022 6:22 PM

## 2022-12-26 ENCOUNTER — Encounter (HOSPITAL_BASED_OUTPATIENT_CLINIC_OR_DEPARTMENT_OTHER)
Admission: RE | Disposition: A | Discharge status: Home / Self Care | Payer: Self-pay | Source: Home / Self Care | Attending: Orthopaedic Surgery

## 2022-12-26 ENCOUNTER — Encounter (HOSPITAL_BASED_OUTPATIENT_CLINIC_OR_DEPARTMENT_OTHER): Payer: Self-pay | Admitting: Orthopaedic Surgery

## 2022-12-26 ENCOUNTER — Ambulatory Visit (HOSPITAL_BASED_OUTPATIENT_CLINIC_OR_DEPARTMENT_OTHER)
Admission: RE | Admit: 2022-12-26 | Discharge: 2022-12-26 | Disposition: A | Discharge status: Home / Self Care | Payer: BC Managed Care – PPO | Attending: Orthopaedic Surgery | Admitting: Orthopaedic Surgery

## 2022-12-26 ENCOUNTER — Ambulatory Visit (HOSPITAL_BASED_OUTPATIENT_CLINIC_OR_DEPARTMENT_OTHER): Payer: BC Managed Care – PPO | Admitting: Certified Registered Nurse Anesthetist

## 2022-12-26 ENCOUNTER — Other Ambulatory Visit: Payer: Self-pay

## 2022-12-26 DIAGNOSIS — S83241A Other tear of medial meniscus, current injury, right knee, initial encounter: Secondary | ICD-10-CM | POA: Diagnosis not present

## 2022-12-26 DIAGNOSIS — I2510 Atherosclerotic heart disease of native coronary artery without angina pectoris: Secondary | ICD-10-CM | POA: Diagnosis not present

## 2022-12-26 DIAGNOSIS — M1711 Unilateral primary osteoarthritis, right knee: Secondary | ICD-10-CM | POA: Diagnosis not present

## 2022-12-26 DIAGNOSIS — M659 Synovitis and tenosynovitis, unspecified: Secondary | ICD-10-CM | POA: Diagnosis not present

## 2022-12-26 DIAGNOSIS — F32A Depression, unspecified: Secondary | ICD-10-CM | POA: Diagnosis not present

## 2022-12-26 DIAGNOSIS — F419 Anxiety disorder, unspecified: Secondary | ICD-10-CM | POA: Insufficient documentation

## 2022-12-26 DIAGNOSIS — Z955 Presence of coronary angioplasty implant and graft: Secondary | ICD-10-CM | POA: Diagnosis not present

## 2022-12-26 DIAGNOSIS — K449 Diaphragmatic hernia without obstruction or gangrene: Secondary | ICD-10-CM | POA: Diagnosis not present

## 2022-12-26 DIAGNOSIS — K219 Gastro-esophageal reflux disease without esophagitis: Secondary | ICD-10-CM | POA: Diagnosis not present

## 2022-12-26 DIAGNOSIS — Z01818 Encounter for other preprocedural examination: Secondary | ICD-10-CM

## 2022-12-26 DIAGNOSIS — R519 Headache, unspecified: Secondary | ICD-10-CM | POA: Diagnosis not present

## 2022-12-26 DIAGNOSIS — M67361 Transient synovitis, right knee: Secondary | ICD-10-CM | POA: Diagnosis not present

## 2022-12-26 DIAGNOSIS — I10 Essential (primary) hypertension: Secondary | ICD-10-CM | POA: Diagnosis not present

## 2022-12-26 HISTORY — PX: SYNOVECTOMY: SHX5180

## 2022-12-26 HISTORY — PX: KNEE ARTHROSCOPY WITH MEDIAL MENISECTOMY: SHX5651

## 2022-12-26 SURGERY — ARTHROSCOPY, KNEE, WITH MEDIAL MENISCECTOMY
Anesthesia: General | Site: Knee | Laterality: Right

## 2022-12-26 MED ORDER — LIDOCAINE 2% (20 MG/ML) 5 ML SYRINGE
INTRAMUSCULAR | Status: DC | PRN
  Administered 2022-12-26: 60 mg via INTRAVENOUS

## 2022-12-26 MED ORDER — DEXAMETHASONE SODIUM PHOSPHATE 10 MG/ML IJ SOLN
INTRAMUSCULAR | Status: DC | PRN
  Administered 2022-12-26: 10 mg via INTRAVENOUS

## 2022-12-26 MED ORDER — CELECOXIB 100 MG PO CAPS: 100.0000 mg | ORAL_CAPSULE | Freq: Two times a day (BID) | ORAL | 0 refills | Status: AC

## 2022-12-26 MED ORDER — ACETAMINOPHEN 500 MG PO TABS: 1000.0000 mg | ORAL_TABLET | Freq: Three times a day (TID) | ORAL | 0 refills | Status: AC

## 2022-12-26 MED ORDER — SODIUM CHLORIDE 0.9 % IR SOLN
Status: DC | PRN
  Administered 2022-12-26: 3000 mL

## 2022-12-26 MED ORDER — ACETAMINOPHEN 500 MG PO TABS: 1000.0000 mg | ORAL_TABLET | Freq: Once | ORAL | Status: DC | PRN

## 2022-12-26 MED ORDER — OXYCODONE HCL 5 MG/5ML PO SOLN: 5.0000 mg | Freq: Once | ORAL | Status: AC | PRN

## 2022-12-26 MED ORDER — BUPIVACAINE HCL (PF) 0.25 % IJ SOLN
INTRAMUSCULAR | Status: DC | PRN
  Administered 2022-12-26: 20 mL

## 2022-12-26 MED ORDER — TRAMADOL HCL 50 MG PO TABS: 50.0000 mg | ORAL_TABLET | Freq: Four times a day (QID) | ORAL | 0 refills | Status: DC | PRN

## 2022-12-26 MED ORDER — DIPHENHYDRAMINE HCL 50 MG/ML IJ SOLN
INTRAMUSCULAR | Status: AC
  Filled 2022-12-26: qty 1

## 2022-12-26 MED ORDER — FENTANYL CITRATE (PF) 100 MCG/2ML IJ SOLN
25.0000 ug | INTRAMUSCULAR | Status: DC | PRN
  Administered 2022-12-26: 50 ug via INTRAVENOUS

## 2022-12-26 MED ORDER — DIPHENHYDRAMINE HCL 50 MG/ML IJ SOLN
INTRAMUSCULAR | Status: DC | PRN
  Administered 2022-12-26: 12.5 mg via INTRAVENOUS

## 2022-12-26 MED ORDER — ACETAMINOPHEN 500 MG PO TABS
1000.0000 mg | ORAL_TABLET | Freq: Once | ORAL | Status: AC
  Administered 2022-12-26: 1000 mg via ORAL

## 2022-12-26 MED ORDER — MIDAZOLAM HCL 2 MG/2ML IJ SOLN
INTRAMUSCULAR | Status: AC
  Filled 2022-12-26: qty 2

## 2022-12-26 MED ORDER — ONDANSETRON HCL 4 MG/2ML IJ SOLN
INTRAMUSCULAR | Status: DC | PRN
  Administered 2022-12-26: 4 mg via INTRAVENOUS

## 2022-12-26 MED ORDER — ONDANSETRON HCL 4 MG PO TABS: 4.0000 mg | ORAL_TABLET | Freq: Three times a day (TID) | ORAL | 0 refills | Status: AC | PRN

## 2022-12-26 MED ORDER — ACETAMINOPHEN 160 MG/5ML PO SOLN: 1000.0000 mg | Freq: Once | ORAL | Status: DC | PRN

## 2022-12-26 MED ORDER — OXYCODONE HCL 5 MG PO TABS
5.0000 mg | ORAL_TABLET | Freq: Once | ORAL | Status: AC | PRN
  Administered 2022-12-26: 5 mg via ORAL

## 2022-12-26 MED ORDER — ASPIRIN 81 MG PO CHEW: 81.0000 mg | CHEWABLE_TABLET | Freq: Two times a day (BID) | ORAL | 0 refills | Status: AC

## 2022-12-26 MED ORDER — ETOMIDATE 2 MG/ML IV SOLN
INTRAVENOUS | Status: DC | PRN
  Administered 2022-12-26: 4 mg via INTRAVENOUS
  Administered 2022-12-26: 16 mg via INTRAVENOUS

## 2022-12-26 MED ORDER — FENTANYL CITRATE (PF) 250 MCG/5ML IJ SOLN
INTRAMUSCULAR | Status: DC | PRN
  Administered 2022-12-26: 50 ug via INTRAVENOUS

## 2022-12-26 MED ORDER — LACTATED RINGERS IV SOLN: INTRAVENOUS | Status: DC

## 2022-12-26 MED ORDER — OXYCODONE HCL 5 MG PO TABS
ORAL_TABLET | ORAL | Status: AC
  Filled 2022-12-26: qty 1

## 2022-12-26 MED ORDER — FENTANYL CITRATE (PF) 100 MCG/2ML IJ SOLN
INTRAMUSCULAR | Status: AC
  Filled 2022-12-26: qty 2

## 2022-12-26 MED ORDER — GABAPENTIN 300 MG PO CAPS
300.0000 mg | ORAL_CAPSULE | Freq: Once | ORAL | Status: AC
  Administered 2022-12-26: 300 mg via ORAL

## 2022-12-26 MED ORDER — ACETAMINOPHEN 10 MG/ML IV SOLN: 1000.0000 mg | Freq: Once | INTRAVENOUS | Status: DC | PRN

## 2022-12-26 MED ORDER — MIDAZOLAM HCL 5 MG/5ML IJ SOLN
INTRAMUSCULAR | Status: DC | PRN
  Administered 2022-12-26: 2 mg via INTRAVENOUS

## 2022-12-26 MED ORDER — ACETAMINOPHEN 500 MG PO TABS
ORAL_TABLET | ORAL | Status: AC
  Filled 2022-12-26: qty 2

## 2022-12-26 MED ORDER — CEFAZOLIN SODIUM-DEXTROSE 2-4 GM/100ML-% IV SOLN
2.0000 g | INTRAVENOUS | Status: AC
  Administered 2022-12-26: 2 g via INTRAVENOUS

## 2022-12-26 MED ORDER — GABAPENTIN 300 MG PO CAPS
ORAL_CAPSULE | ORAL | Status: AC
  Filled 2022-12-26: qty 1

## 2022-12-26 SURGICAL SUPPLY — 34 items
APL PRP STRL LF DISP 70% ISPRP (MISCELLANEOUS) ×2
BANDAGE ESMARK 6X9 LF (GAUZE/BANDAGES/DRESSINGS) IMPLANT
BNDG CMPR 5X62 HK CLSR LF (GAUZE/BANDAGES/DRESSINGS) ×2
BNDG CMPR 6"X 5 YARDS HK CLSR (GAUZE/BANDAGES/DRESSINGS) ×2
BNDG CMPR 9X6 STRL LF SNTH (GAUZE/BANDAGES/DRESSINGS)
BNDG ELASTIC 6INX 5YD STR LF (GAUZE/BANDAGES/DRESSINGS) ×3 IMPLANT
BNDG ESMARK 6X9 LF (GAUZE/BANDAGES/DRESSINGS)
CHLORAPREP W/TINT 26 (MISCELLANEOUS) ×3 IMPLANT
CLSR STERI-STRIP ANTIMIC 1/2X4 (GAUZE/BANDAGES/DRESSINGS) ×3 IMPLANT
CUFF TOURN SGL QUICK 34 (TOURNIQUET CUFF) ×2
CUFF TRNQT CYL 34X4.125X (TOURNIQUET CUFF) ×3 IMPLANT
DISSECTOR 4.0MMX13CM CVD (MISCELLANEOUS) ×3 IMPLANT
DRAPE ARTHROSCOPY W/POUCH 90 (DRAPES) ×3 IMPLANT
DRAPE IMP U-DRAPE 54X76 (DRAPES) ×3 IMPLANT
DRAPE U-SHAPE 47X51 STRL (DRAPES) ×3 IMPLANT
GAUZE SPONGE 4X4 12PLY STRL (GAUZE/BANDAGES/DRESSINGS) ×3 IMPLANT
GLOVE BIO SURGEON STRL SZ 6.5 (GLOVE) ×3 IMPLANT
GLOVE BIOGEL PI IND STRL 6.5 (GLOVE) ×3 IMPLANT
GLOVE BIOGEL PI IND STRL 8 (GLOVE) ×3 IMPLANT
GLOVE ECLIPSE 8.0 STRL XLNG CF (GLOVE) ×6 IMPLANT
GOWN STRL REUS W/ TWL LRG LVL3 (GOWN DISPOSABLE) ×3 IMPLANT
GOWN STRL REUS W/TWL LRG LVL3 (GOWN DISPOSABLE) ×2
GOWN STRL REUS W/TWL XL LVL3 (GOWN DISPOSABLE) ×3 IMPLANT
KIT TURNOVER KIT B (KITS) ×3 IMPLANT
MANIFOLD NEPTUNE II (INSTRUMENTS) IMPLANT
NS IRRIG 1000ML POUR BTL (IV SOLUTION) IMPLANT
PACK ARTHROSCOPY DSU (CUSTOM PROCEDURE TRAY) ×3 IMPLANT
PORT APPOLLO RF 90DEGREE MULTI (SURGICAL WAND) IMPLANT
SLEEVE SCD COMPRESS KNEE MED (STOCKING) ×3 IMPLANT
SUT MNCRL AB 4-0 PS2 18 (SUTURE) ×3 IMPLANT
TOWEL GREEN STERILE FF (TOWEL DISPOSABLE) ×3 IMPLANT
TUBE CONNECTING 20X1/4 (TUBING) ×3 IMPLANT
TUBING ARTHROSCOPY IRRIG 16FT (MISCELLANEOUS) ×3 IMPLANT
WATER STERILE IRR 1000ML POUR (IV SOLUTION) ×3 IMPLANT

## 2022-12-26 NOTE — Op Note (Signed)
Orthopaedic Surgery Operative Note (CSN: [id number])  Miranda Baker  Oct 03, 1960 Date of Surgery: 12/26/2022   Diagnoses:  Right medial meniscus tear and synovitis  Procedure: Right medial and partial meniscectomy and synovectomy   Operative Finding Patellofemoral and lateral compartments were normal, chondral surfaces were normal in those areas as well.  Redness and synovitis throughout the knee debrided.  There was a radial type tear of the medial meniscus with a horizontal component.  We debrided this back about 15% total meniscal volume.  There is grade 1 and 2 softening throughout the medial femoral condyle.  No full-thickness areas of cartilage loss.  Successful completion of the planned procedure.    Post-operative plan: The patient will be weightbearing to tolerance.  The patient will be discharged home.  DVT prophylaxis Aspirin 81 mg twice daily for 6 weeks.  Pain control with PRN pain medication preferring oral medicines.  Follow up plan will be scheduled in approximately 7 days for incision check.  Post-Op Diagnosis: Same Surgeons:Primary: Bjorn Pippin, MD Assistants:Caroline McBane PA-C Location: [facility] OR ROOM 1 Anesthesia: General with local Antibiotics: Ancef 2 g Tourniquet time:  Estimated Blood Loss: Minimal Complications: None Specimens: None Implants: * No implants in log *  Indications for Surgery:   [name] is a 62 y.o. female with medial meniscus tear.  Benefits and risks of operative and nonoperative management were discussed prior to surgery with patient/guardian(s) and informed consent form was completed.  Specific risks including infection, need for additional surgery, post meniscectomy syndrome, continued pain, rapid arthrosis   Procedure:   The patient was identified properly. Informed consent was obtained and the surgical site was marked. The patient was taken up to suite where general anesthesia was induced. The patient was placed in the supine  position with a post against the surgical leg and a nonsterile tourniquet applied. The surgical leg was then prepped and draped usual sterile fashion.  A standard surgical timeout was performed.  2 standard anterior portals were made and diagnostic arthroscopy performed. Please note the findings as noted above.  We used a shaver and basket debride back the meniscus to a stable base.  We used a side-biting basket to obtain reasonable resection.  It was a radial tear in the far medial aspect of the meniscus.  There is some grade 1 and 2 changes to the medial femoral condyle medial tibial plateau there is debrided back.  Synovectomy formed of the anterior, medial and lateral areas of the knee.  Incisions closed with absorbable suture. The patient was awoken from general anesthesia and taken to the PACU in stable condition without complication.   Alfonse Alpers, PA-C, present and scrubbed throughout the case, critical for completion in a timely fashion, and for retraction, instrumentation, closure.

## 2022-12-26 NOTE — Anesthesia Postprocedure Evaluation (Signed)
Anesthesia Post Note  Patient: Marena Buckhannon Magro  Procedure(s) Performed: KNEE ARTHROSCOPY WITH MEDIAL MENISECTOMY (Right: Knee) SYNOVECTOMY (Right)     Patient location during evaluation: PACU Anesthesia Type: General Level of consciousness: awake and alert Pain management: pain level controlled Vital Signs Assessment: post-procedure vital signs reviewed and stable Respiratory status: spontaneous breathing, nonlabored ventilation and respiratory function stable Cardiovascular status: blood pressure returned to baseline Postop Assessment: no apparent nausea or vomiting Anesthetic complications: no   No notable events documented.  Last Vitals:  Vitals:   12/26/22 1256 12/26/22 1312  BP:  125/63  Pulse: 62 65  Resp: 12 16  Temp:  37.1 C  SpO2: 96% 96%    Last Pain:  Vitals:   12/26/22 1312  TempSrc: Oral  PainSc: 6                  Shanda Howells

## 2022-12-26 NOTE — Anesthesia Preprocedure Evaluation (Signed)
Anesthesia Evaluation  Patient identified by MRN, date of birth, ID band Patient awake    Reviewed: Allergy & Precautions, NPO status , Patient's Chart, lab work & pertinent test results  History of Anesthesia Complications (+) PONV and history of anesthetic complications  Airway Mallampati: IV  TM Distance: <3 FB Neck ROM: Full    Dental  (+) Teeth Intact, Dental Advisory Given   Pulmonary neg pulmonary ROS   breath sounds clear to auscultation       Cardiovascular hypertension, Pt. on medications (-) angina + CAD and + Cardiac Stents   Rhythm:Regular    Coronary angiogram 01/21/12:  LAD: LAD gives origin to a large diagonal-1. Diagonal one is very large and he is equivalent to the LAD. LAD has mild luminal irregularities. There is a stent (July 2012 2.75x15 mm Xience stent) in the proximal portion of the D1 which is widely patent. The ostium of the D1 and the LAD just after the D1 origin show 20% stenoses. Mid LAD shows mild luminal irregularity. EF 60%   Exercise Sestamibi Stress Test 01/03/2021: Normal ECG stress. The patient exercised for 4 minutes and 19 seconds of a Bruce protocol, achieving approximately 6.22 METs. Markedly reduced exercise tolerance.  The heart rate response was accelerated.  Normal BP response. Myocardial perfusion is normal. Overall LV systolic function is normal without regional wall motion abnormalities. Stress LV EF: 77%. No previous exam available for comparison. Low risk.   Echocardiogram 01/04/2021:  Normal LV systolic function with visual EF 60-65%. Left ventricle cavity is normal in size. Normal global wall motion. Normal diastolic filling pattern, normal LAP.  Trace tricuspid regurgitation. No evidence of pulmonary hypertension.  No prior study for comparison.    Neuro/Psych  Headaches PSYCHIATRIC DISORDERS Anxiety Depression     Neuromuscular disease    GI/Hepatic Neg liver ROS, hiatal  hernia,GERD  Medicated,,  Endo/Other  negative endocrine ROS    Renal/GU negative Renal ROS     Musculoskeletal  (+) Arthritis ,   right knee medial meniscus tear, synovitis   Abdominal   Peds  Hematology negative hematology ROS (+)   Anesthesia Other Findings   Reproductive/Obstetrics                              Anesthesia Physical Anesthesia Plan  ASA: 2  Anesthesia Plan: General   Post-op Pain Management: Tylenol PO (pre-op)* and Gabapentin PO (pre-op)*   Induction: Intravenous  PONV Risk Score and Plan: 4 or greater and Ondansetron, Dexamethasone, Midazolam and Diphenhydramine  Airway Management Planned: LMA  Additional Equipment: None  Intra-op Plan:   Post-operative Plan: Extubation in OR  Informed Consent: I have reviewed the patients History and Physical, chart, labs and discussed the procedure including the risks, benefits and alternatives for the proposed anesthesia with the patient or authorized representative who has indicated his/her understanding and acceptance.     Dental advisory given  Plan Discussed with: CRNA  Anesthesia Plan Comments: (Unclear events in 01/2019 post EGD/colonoscopy at outside institution resulting in hospital admission for fever. Patient relays information there was concern about her heart and possible drug reaction to propofol. Outside anesthesia record not available. Abx and mention of possible drug reaction only treatment during hospitalization. No cardiac consult or mention of cardiac concerns. Patient has had an anesthetic without propofol at [facility] and one with propofol at [facility] since without issue. Given uncertainty will avoid propofol, although unlikely the issue in 01/2019,  and proceed with a general anesthetic with etomidate induction. )         Anesthesia Quick Evaluation

## 2022-12-26 NOTE — Interval H&P Note (Signed)
All questions answered, patient wants to proceed with procedure. ? ?

## 2022-12-26 NOTE — Transfer of Care (Signed)
Immediate Anesthesia Transfer of Care Note  Patient: Miranda Baker  Procedure(s) Performed: KNEE ARTHROSCOPY WITH MEDIAL MENISECTOMY (Right: Knee) SYNOVECTOMY (Right)  Patient Location: PACU  Anesthesia Type:General  Level of Consciousness: drowsy, patient cooperative, and responds to stimulation  Airway & Oxygen Therapy: Patient Spontanous Breathing and Patient connected to face mask oxygen  Post-op Assessment: Report given to RN and Post -op Vital signs reviewed and stable  Post vital signs: Reviewed and stable  Last Vitals:  Vitals Value Taken Time  BP    Temp    Pulse 71 12/26/22 1129  Resp 15 12/26/22 1129  SpO2 100 % 12/26/22 1129  Vitals shown include unvalidated device data.  Last Pain:  Vitals:   12/26/22 0929  TempSrc: Temporal  PainSc: 4       Patients Stated Pain Goal: 2 (12/26/22 0929)  Complications: No notable events documented.

## 2022-12-26 NOTE — Anesthesia Procedure Notes (Signed)
Procedure Name: LMA Insertion Date/Time: 12/26/2022 10:52 AM  Performed by: Demetrio Lapping, CRNAPre-anesthesia Checklist: Patient identified, Emergency Drugs available, Suction available and Patient being monitored Patient Re-evaluated:Patient Re-evaluated prior to induction Oxygen Delivery Method: Circle System Utilized Preoxygenation: Pre-oxygenation with 100% oxygen Induction Type: IV induction Ventilation: Mask ventilation without difficulty LMA: LMA inserted LMA Size: 4.0 Number of attempts: 1 Airway Equipment and Method: Bite block Placement Confirmation: positive ETCO2 Tube secured with: Tape Dental Injury: Teeth and Oropharynx as per pre-operative assessment

## 2022-12-27 ENCOUNTER — Encounter (HOSPITAL_BASED_OUTPATIENT_CLINIC_OR_DEPARTMENT_OTHER): Payer: Self-pay | Admitting: Orthopaedic Surgery

## 2022-12-31 DIAGNOSIS — S83211D Bucket-handle tear of medial meniscus, current injury, right knee, subsequent encounter: Secondary | ICD-10-CM | POA: Diagnosis not present

## 2023-01-06 DIAGNOSIS — S83211D Bucket-handle tear of medial meniscus, current injury, right knee, subsequent encounter: Secondary | ICD-10-CM | POA: Diagnosis not present

## 2023-01-20 ENCOUNTER — Other Ambulatory Visit: Payer: Self-pay | Admitting: Family Medicine

## 2023-01-20 DIAGNOSIS — K219 Gastro-esophageal reflux disease without esophagitis: Secondary | ICD-10-CM

## 2023-01-21 DIAGNOSIS — S83211D Bucket-handle tear of medial meniscus, current injury, right knee, subsequent encounter: Secondary | ICD-10-CM | POA: Diagnosis not present

## 2023-02-09 ENCOUNTER — Other Ambulatory Visit: Payer: Self-pay | Admitting: Cardiology

## 2023-02-09 DIAGNOSIS — J4541 Moderate persistent asthma with (acute) exacerbation: Secondary | ICD-10-CM

## 2023-02-19 ENCOUNTER — Other Ambulatory Visit: Payer: Self-pay | Admitting: Family Medicine

## 2023-02-19 DIAGNOSIS — E7800 Pure hypercholesterolemia, unspecified: Secondary | ICD-10-CM

## 2023-04-18 ENCOUNTER — Other Ambulatory Visit: Payer: Self-pay | Admitting: Family Medicine

## 2023-05-18 ENCOUNTER — Other Ambulatory Visit: Payer: Self-pay | Admitting: Family Medicine

## 2023-06-03 ENCOUNTER — Other Ambulatory Visit: Payer: Self-pay | Admitting: Family Medicine

## 2023-06-03 MED ORDER — METHYLPHENIDATE HCL ER 20 MG PO TBCR: EXTENDED_RELEASE_TABLET | ORAL | 0 refills | Status: DC

## 2023-06-08 ENCOUNTER — Other Ambulatory Visit: Payer: Self-pay | Admitting: Family Medicine

## 2023-06-24 ENCOUNTER — Other Ambulatory Visit (HOSPITAL_COMMUNITY): Payer: Self-pay

## 2023-08-13 ENCOUNTER — Other Ambulatory Visit: Payer: Self-pay | Admitting: Family Medicine

## 2023-08-13 ENCOUNTER — Encounter: Payer: Self-pay | Admitting: Family Medicine

## 2023-08-13 MED ORDER — CEFDINIR 300 MG PO CAPS: 300.0000 mg | ORAL_CAPSULE | Freq: Two times a day (BID) | ORAL | 0 refills | Status: DC

## 2023-08-13 MED ORDER — PREDNISONE 50 MG PO TABS: ORAL_TABLET | ORAL | 1 refills | Status: DC

## 2023-08-13 NOTE — Progress Notes (Unsigned)
 Mssg she needs abx

## 2023-08-17 ENCOUNTER — Other Ambulatory Visit: Payer: Self-pay | Admitting: Family Medicine

## 2023-09-19 ENCOUNTER — Other Ambulatory Visit: Payer: Self-pay | Admitting: Family Medicine

## 2023-09-19 MED ORDER — PREDNISONE 50 MG PO TABS: ORAL_TABLET | ORAL | 1 refills | Status: DC

## 2023-10-06 ENCOUNTER — Encounter: Payer: Self-pay | Admitting: Cardiology

## 2023-10-06 ENCOUNTER — Ambulatory Visit: Payer: BC Managed Care – PPO | Attending: Cardiology | Admitting: Cardiology

## 2023-10-06 VITALS — BP 120/78 | HR 74 | Resp 16 | Ht 64.0 in | Wt 201.8 lb

## 2023-10-06 DIAGNOSIS — I25118 Atherosclerotic heart disease of native coronary artery with other forms of angina pectoris: Secondary | ICD-10-CM | POA: Diagnosis not present

## 2023-10-06 DIAGNOSIS — I10 Essential (primary) hypertension: Secondary | ICD-10-CM | POA: Diagnosis not present

## 2023-10-06 DIAGNOSIS — E7800 Pure hypercholesterolemia, unspecified: Secondary | ICD-10-CM

## 2023-10-06 NOTE — Progress Notes (Signed)
 Cardiology Office Note:  .   Date:  10/06/2023  ID:  Miranda Baker, DOB 1960-11-07, MRN [id number] PCP: [name], MD  [facility] Providers Cardiologist:  Yates Decamp, MD   History of Present Illness: .   Miranda Baker is a 63 y.o.   Discussed the use of AI scribe software for clinical note transcription with the patient, who gave verbal consent to proceed.  History of Present Illness   The patient, with a history of heart disease managed with Lisinopril, presents for a routine follow-up. She is unsure if her Lisinopril is a combo pill with hydroxychloroquine. She acknowledges that she has not been adhering to a healthy lifestyle, particularly in terms of physical activity, and has gained weight. 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 She also acknowledges that she forgot to get her blood work done prior to the appointment. Her EKG results and blood pressure are reported to be good.     Labs   Lab Results  Component Value Date   CHOL 154 10/10/2022   HDL 66 10/10/2022   LDLCALC 47 10/10/2022   LDLDIRECT 62 10/10/2022   TRIG 267 (H) 10/10/2022   CHOLHDL 2.6 10/24/2021   Lab Results  Component Value Date   NA 141 10/10/2022   K 4.4 10/10/2022   CO2 18 (L) 10/10/2022   GLUCOSE 88 10/10/2022   BUN 20 10/10/2022   CREATININE 1.04 (H) 10/10/2022   CALCIUM 10.6 (H) 10/10/2022   EGFR 60 10/10/2022   GFRNONAA 58 (L) 09/29/2020      Latest Ref Rng & Units 10/10/2022    1:14 PM 10/24/2021    9:50 AM 09/29/2020    2:19 PM  BMP  Glucose 70 - 99 mg/dL 88  97  86   BUN 8 - 27 mg/dL 20  19  21    Creatinine 0.57 - 1.00 mg/dL 4.09  8.11  9.14   BUN/Creat Ratio 12 - 28 19  18  20    Sodium 134 - 144 mmol/L 141  142  141   Potassium 3.5 - 5.2 mmol/L 4.4  4.5  4.5   Chloride 96 - 106 mmol/L 104  106  102   CO2 20 - 29 mmol/L 18  22  23    Calcium 8.7 - 10.3 mg/dL 78.2  95.6  21.3       Latest Ref Rng & Units 11/18/2022    9:44 AM 10/10/2022     1:14 PM 10/24/2021    9:50 AM  CBC  WBC 4.0 - 10.5 K/uL 7.5  7.6  6.6   Hemoglobin 12.0 - 15.0 g/dL 08.6  57.8  46.9   Hematocrit 36.0 - 46.0 % 41.1  42.7  41.3   Platelets 150.0 - 400.0 K/uL 272.0  177  256    Lab Results  Component Value Date   TSH 2.580 10/24/2021    Review of Systems  Cardiovascular:  Negative for chest pain, dyspnea on exertion and leg swelling.   Physical Exam:   VS:  BP 120/78 (BP Location: Left Arm, Patient Position: Sitting, Cuff Size: Normal)   Pulse 74   Resp 16   Ht 5\' 4"  (1.626 m)   Wt 201 lb 12.8 oz (91.5 kg)   SpO2 98%   BMI 34.64 kg/m    Wt Readings from Last 3 Encounters:  10/06/23 201 lb 12.8 oz (91.5 kg)  12/26/22 195 lb 1.7 oz (88.5 kg)  11/18/22 197 lb (89.4 kg)     Physical Exam Constitutional:      Appearance: She is obese.  Neck:     Vascular: No carotid bruit or JVD.  Cardiovascular:     Rate and Rhythm: Normal rate and regular rhythm.     Pulses: Intact distal pulses.     Heart sounds: Normal heart sounds. No murmur heard.    No gallop.  Pulmonary:     Effort: Pulmonary effort is normal.     Breath sounds: Normal breath sounds.  Abdominal:     General: Bowel sounds are normal.     Palpations: Abdomen is soft.  Musculoskeletal:     Right lower leg: No edema.     Left lower leg: No edema.    EKG:    EKG Interpretation Date/Time:  Monday October 06 2023 16:29:29 EST Ventricular Rate:  66 PR Interval:  160 QRS Duration:  82 QT Interval:  404 QTC Calculation: 423 R Axis:   -11  Text Interpretation: EKG 10/06/2023: Normal sinus rhythm at rate of 66 bpm, normal axis, nonspecific diffuse T wave abnormality.  Compared to 01/06/2019, no significant change. Confirmed by Delrae Rend [phone] on 10/06/2023 4:57:44 PM    Medications and allergies    Allergies  Allergen Reactions   Penicillins Anaphylaxis   Morphine And Codeine Nausea And Vomiting and Other (See Comments)   Propofol     Cardiac dysrhthmias after EGD    Bactrim [Sulfamethoxazole-Trimethoprim] Nausea Only    Intolerance only   Erythromycin Nausea And Vomiting and Rash   Tetracyclines & Related Nausea And Vomiting and Rash   Vibramycin [Doxycycline Calcium] Nausea And Vomiting and Rash    Current Outpatient Medications:    ALPRAZolam (XANAX) 1 MG tablet, TAKE ONE TABLET BY MOUTH UP TO 3 TIMES DAILY AS NEEDED FOR ANXIETY, Disp: 90 tablet, Rfl: 1   aspirin 81 MG chewable tablet, Chew 81 mg by mouth daily., Disp: , Rfl:    citalopram (CELEXA) 40 MG tablet, TAKE 1 TABLET BY MOUTH EVERY DAY, Disp: 90 tablet, Rfl: 2   ezetimibe (ZETIA) 10 MG tablet, TAKE 1 TABLET BY MOUTH EVERY DAY, Disp: 90 tablet, Rfl: 3   methylphenidate (METADATE ER) 20 MG ER tablet, TAKE ONE TABLET BY MOUTH EACH MORNING AND REPEAT ONCE AT NOON DAILY AS DIRECTED, Disp: 180 tablet, Rfl: 0   metoprolol tartrate (LOPRESSOR) 25 MG tablet, TAKE 1 TABLET BY MOUTH EVERY DAY, Disp: 90 tablet, Rfl: 3   montelukast (SINGULAIR) 10 MG tablet, TAKE 1 TABLET BY MOUTH EVERYDAY AT BEDTIME, Disp: 90 tablet, Rfl: 3   nitroGLYCERIN (NITROSTAT) 0.4 MG SL tablet, Place 1 tablet (0.4 mg total) under the tongue every 5 (five) minutes x 3 doses as needed for chest pain., Disp: 30 tablet, Rfl: 3   olmesartan (BENICAR) 40 MG tablet, TAKE 1 TABLET BY MOUTH EVERY DAY, Disp: 90 tablet, Rfl: 3   pantoprazole (PROTONIX) 40 MG tablet, TAKE 1 TABLET BY MOUTH EVERY DAY, Disp: 90 tablet, Rfl: 3   PATADAY 0.2 % SOLN, INSTILL ONE DROP INTO THE AFFECTED EYE(S) ONCE DAILY AS DIRECTED (Patient taking differently: 1 drop See admin instructions. In affected eye as needed for allergic conjuctvitis), Disp: 2.5 mL, Rfl: 12   rosuvastatin (CRESTOR) 20 MG tablet, TAKE 1 TABLET BY MOUTH EVERY DAY, Disp: 90 tablet, Rfl: 3   traMADol (ULTRAM) 50 MG tablet, Take 1 tablet (50 mg total) by mouth every 6 (six) hours as needed for severe pain., Disp: 20  tablet, Rfl: 0   ASSESSMENT AND PLAN: .      ICD-10-CM   1. Coronary artery  disease of native artery of native heart with stable angina pectoris (HCC)  I25.118 EKG 12-Lead    2. Primary hypertension  I10     3. Hypercholesteremia  E78.00      1. Coronary artery disease of native artery of native heart with stable angina pectoris (HCC) CAD of the native vessel with stable angina pectoris Patient is presently doing well and remains asymptomatic and has fortunately not used any sublingual nitroglycerin.  Presently on metoprolol tartrate 25 mg twice daily and sublingual nitroglycerin to be used on a as needed basis along with aspirin 81 mg daily.  Continue the same.  2. Primary hypertension Blood pressure is well-controlled at 120/78 mmHg.  Presently on olmesartan 40 mg daily along with metoprolol tartrate 25 mg twice daily, continue the same.  3. Hypercholesteremia Presently tolerating Crestor 20 mg daily along with Zetia 10 mg daily with excellent control of lipids as noted previously, however recent lipids are pending, lab orders are active.  General health maintenance discussed extensively with the patient especially regarding her sedentary job, increased CV risk, weight gain.  Office visit in a year or sooner if problems.   Signed,  Yates Decamp, MD, [facility] 10/06/2023, 5:57 PM [facility] [address] [place], [place] [address] Phone: [phone]. Fax:  [phone]

## 2023-10-06 NOTE — Patient Instructions (Signed)

## 2023-10-13 ENCOUNTER — Ambulatory Visit: Payer: Self-pay | Admitting: Cardiology

## 2023-10-14 ENCOUNTER — Encounter: Payer: Self-pay | Admitting: Cardiology

## 2023-10-14 DIAGNOSIS — I25118 Atherosclerotic heart disease of native coronary artery with other forms of angina pectoris: Secondary | ICD-10-CM

## 2023-10-14 DIAGNOSIS — E7800 Pure hypercholesterolemia, unspecified: Secondary | ICD-10-CM

## 2023-10-14 DIAGNOSIS — I10 Essential (primary) hypertension: Secondary | ICD-10-CM

## 2023-10-14 NOTE — Telephone Encounter (Signed)
 CBC, CMP and lipid profile please

## 2023-10-23 LAB — LIPID PANEL

## 2023-10-24 ENCOUNTER — Encounter: Payer: Self-pay | Admitting: Cardiology

## 2023-10-24 LAB — LIPID PANEL
Cholesterol, Total: 163 mg/dL (ref 100–199)
HDL: 65 mg/dL (ref >39)
LDL CALC COMMENT:: 2.5 ratio (ref 0.0–4.4)
LDL Chol Calc (NIH): 72 mg/dL (ref 0–99)
Triglycerides: 151 mg/dL — ABNORMAL HIGH (ref 0–149)
VLDL Cholesterol Cal: 26 mg/dL (ref 5–40)

## 2023-10-24 LAB — COMPREHENSIVE METABOLIC PANEL
ALT: 18 IU/L (ref 0–32)
AST: 20 IU/L (ref 0–40)
Albumin: 4.4 g/dL (ref 3.9–4.9)
Alkaline Phosphatase: 73 IU/L (ref 44–121)
BUN/Creatinine Ratio: 15 (ref 12–28)
BUN: 14 mg/dL (ref 8–27)
Bilirubin Total: 0.5 mg/dL (ref 0.0–1.2)
CO2: 22 mmol/L (ref 20–29)
Calcium: 10.2 mg/dL (ref 8.7–10.3)
Chloride: 105 mmol/L (ref 96–106)
Creatinine, Ser: 0.95 mg/dL (ref 0.57–1.00)
Globulin, Total: 2.1 g/dL (ref 1.5–4.5)
Glucose: 87 mg/dL (ref 70–99)
Potassium: 4.6 mmol/L (ref 3.5–5.2)
Sodium: 140 mmol/L (ref 134–144)
Total Protein: 6.5 g/dL (ref 6.0–8.5)
eGFR: 67 mL/min/{1.73_m2} (ref >59)

## 2023-10-24 LAB — CBC
Hematocrit: 43.5 % (ref 34.0–46.6)
Hemoglobin: 13.7 g/dL (ref 11.1–15.9)
MCH: 28.4 pg (ref 26.6–33.0)
MCHC: 31.5 g/dL (ref 31.5–35.7)
MCV: 90 fL (ref 79–97)
Platelets: 289 10*3/uL (ref 150–450)
RBC: 4.82 x10E6/uL (ref 3.77–5.28)
RDW: 12.9 % (ref 11.7–15.4)
WBC: 5.4 10*3/uL (ref 3.4–10.8)

## 2023-10-24 NOTE — Progress Notes (Signed)
 Lipids are significantly improved especially triglycerides.  Please continue exercise and weight loss.  LFTs and renal function are normal.  Labs are normal.  I will forward to your PCP as well.

## 2023-10-27 ENCOUNTER — Other Ambulatory Visit: Payer: Self-pay | Admitting: Family Medicine

## 2023-10-27 ENCOUNTER — Encounter: Payer: Self-pay | Admitting: Family Medicine

## 2023-10-27 MED ORDER — METHYLPHENIDATE HCL ER 20 MG PO TBCR: EXTENDED_RELEASE_TABLET | ORAL | 0 refills | Status: DC

## 2023-10-27 MED ORDER — ALPRAZOLAM 1 MG PO TABS: ORAL_TABLET | ORAL | 1 refills | Status: DC

## 2023-10-28 ENCOUNTER — Encounter: Payer: Self-pay | Admitting: Pulmonary Disease

## 2024-01-16 ENCOUNTER — Other Ambulatory Visit: Payer: Self-pay | Admitting: Family Medicine

## 2024-02-11 ENCOUNTER — Other Ambulatory Visit: Payer: Self-pay | Admitting: Family Medicine

## 2024-02-11 DIAGNOSIS — E7800 Pure hypercholesterolemia, unspecified: Secondary | ICD-10-CM

## 2024-03-04 IMAGING — MG MM DIGITAL SCREENING BILAT W/ TOMO AND CAD
8 series · 8 of 24 positions shown · non-contrast
Comparison: None.

CLINICAL DATA: Screening.

EXAM:
DIGITAL SCREENING BILATERAL MAMMOGRAM WITH TOMOSYNTHESIS AND CAD
TECHNIQUE: Bilateral screening digital craniocaudal and mediolateral oblique
mammograms were obtained. Bilateral screening digital breast
tomosynthesis was performed. The images were evaluated with
computer-aided detection.

[R CC synth-2D]
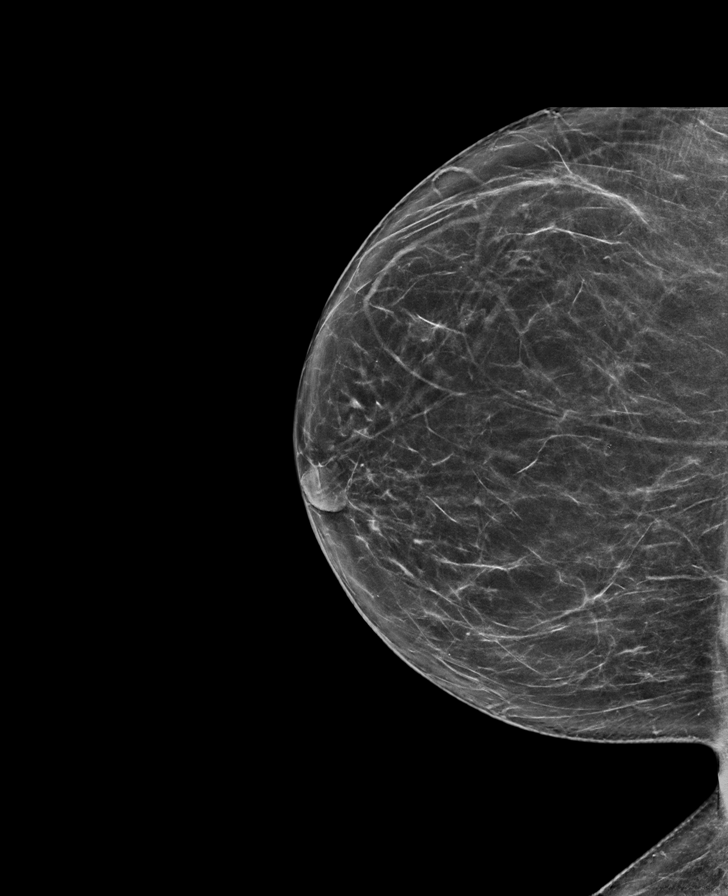

[L CC synth-2D]
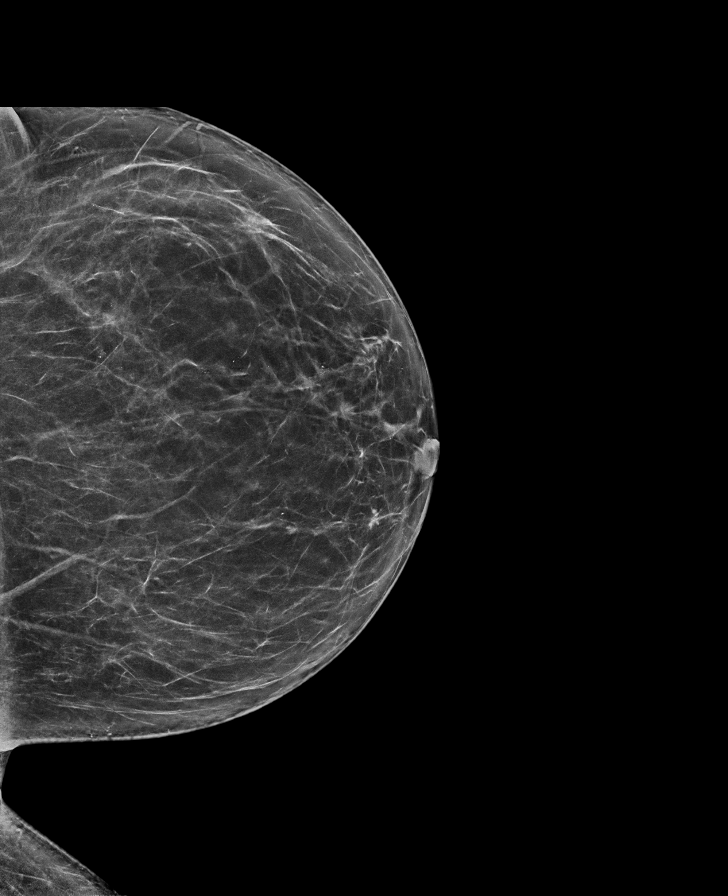

[R MLO synth-2D]
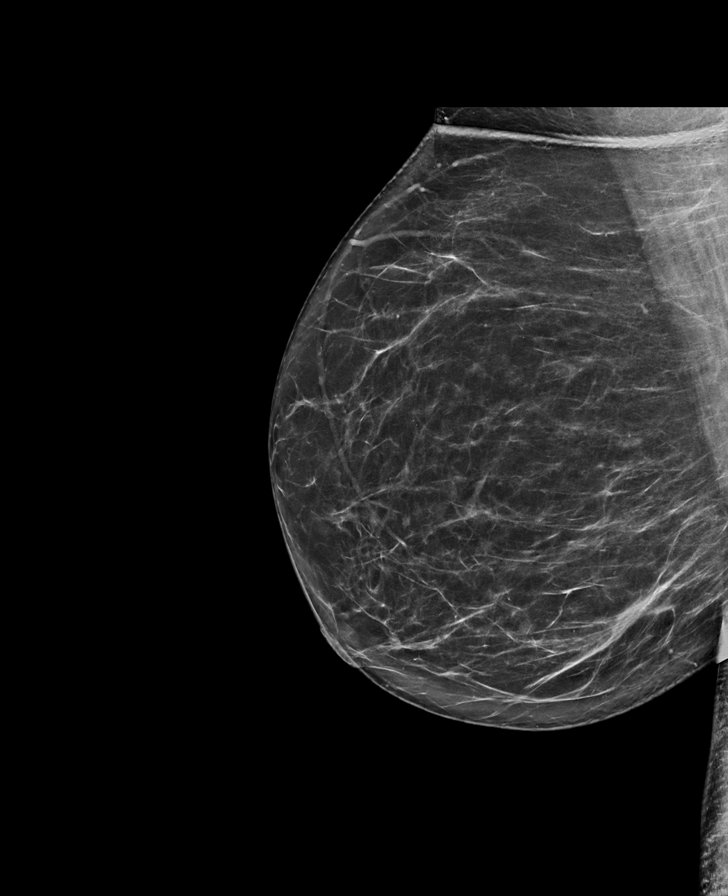

[L MLO synth-2D]
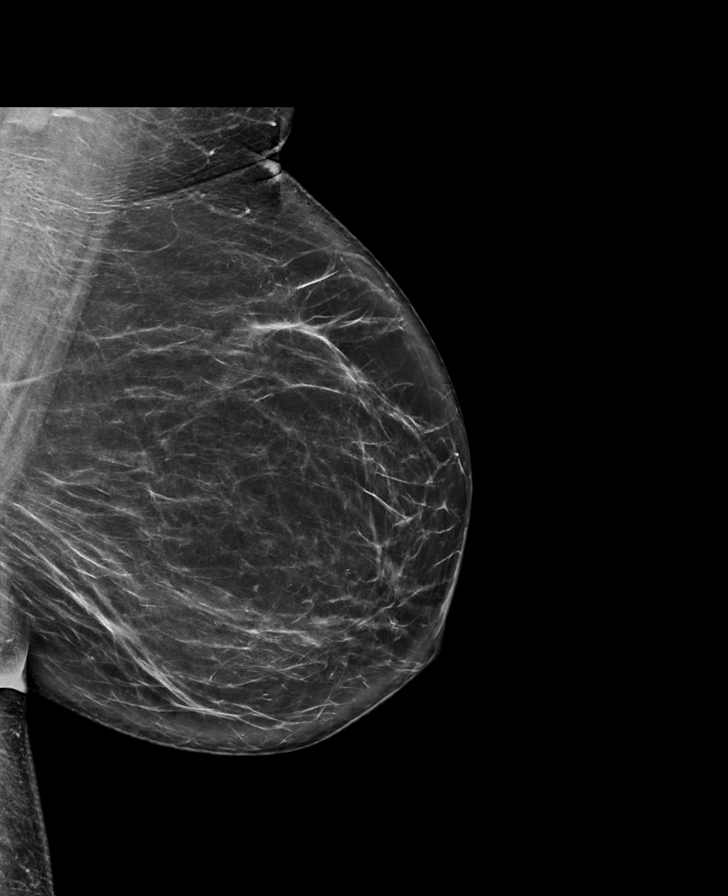

[R MLO tomo · tomo slice 41/81.0]
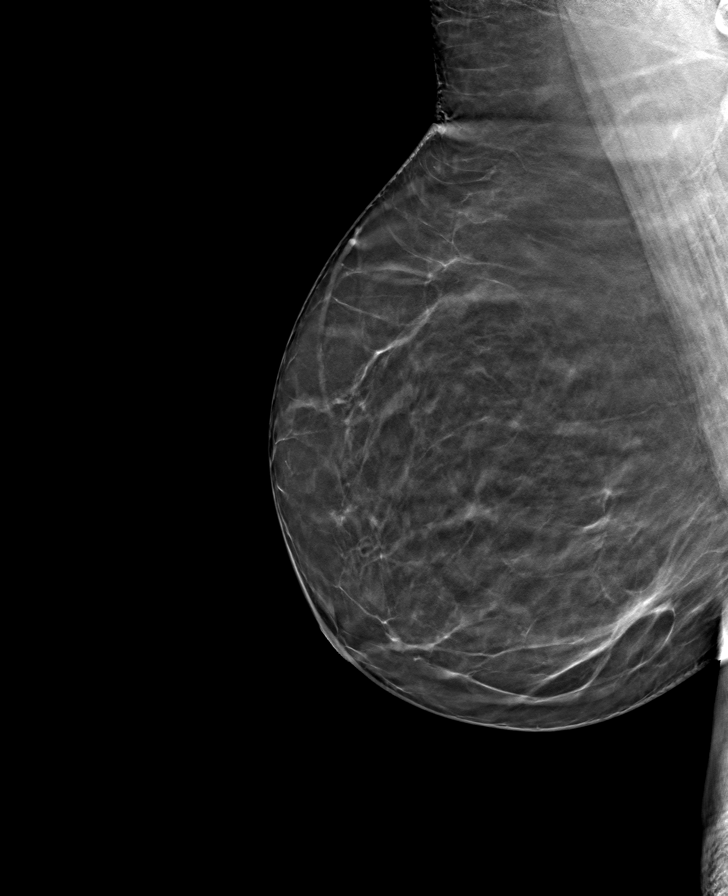

[L MLO tomo · tomo slice 45/88.0]
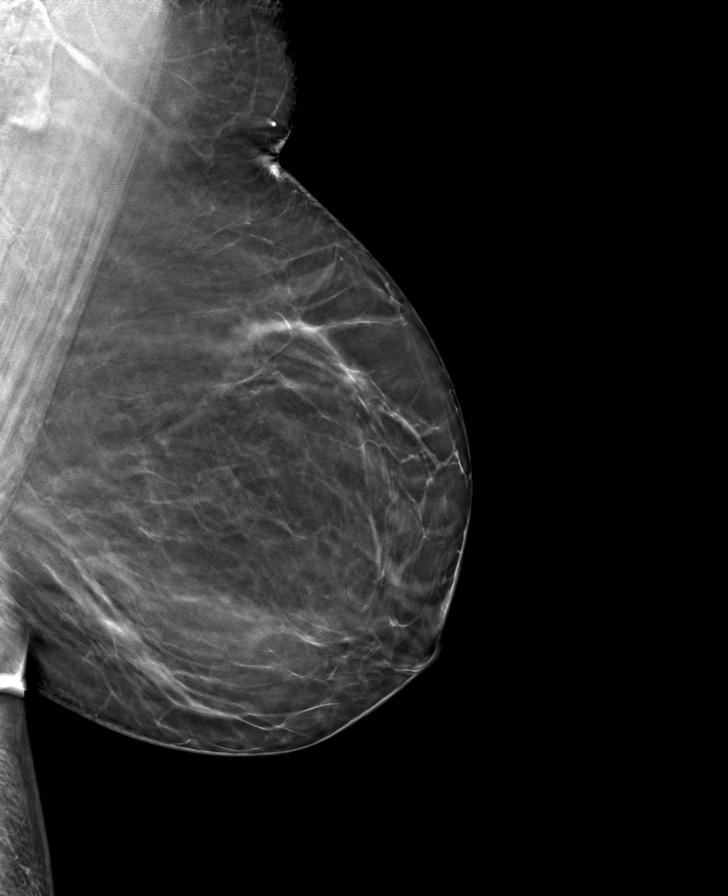

[L CC tomo · tomo slice 39/77.0]
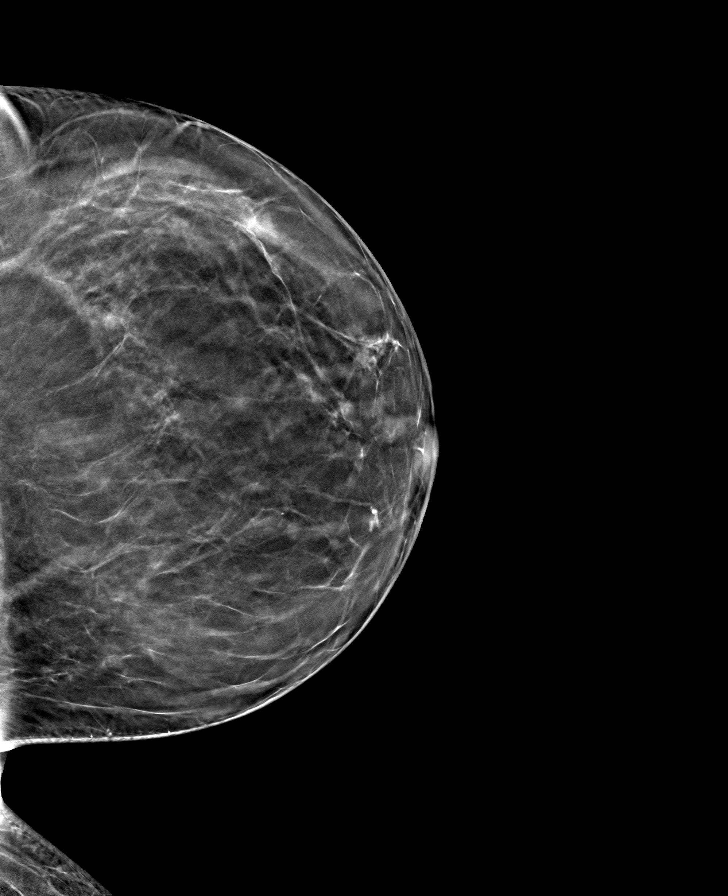

[R CC tomo · tomo slice 39/78.0]
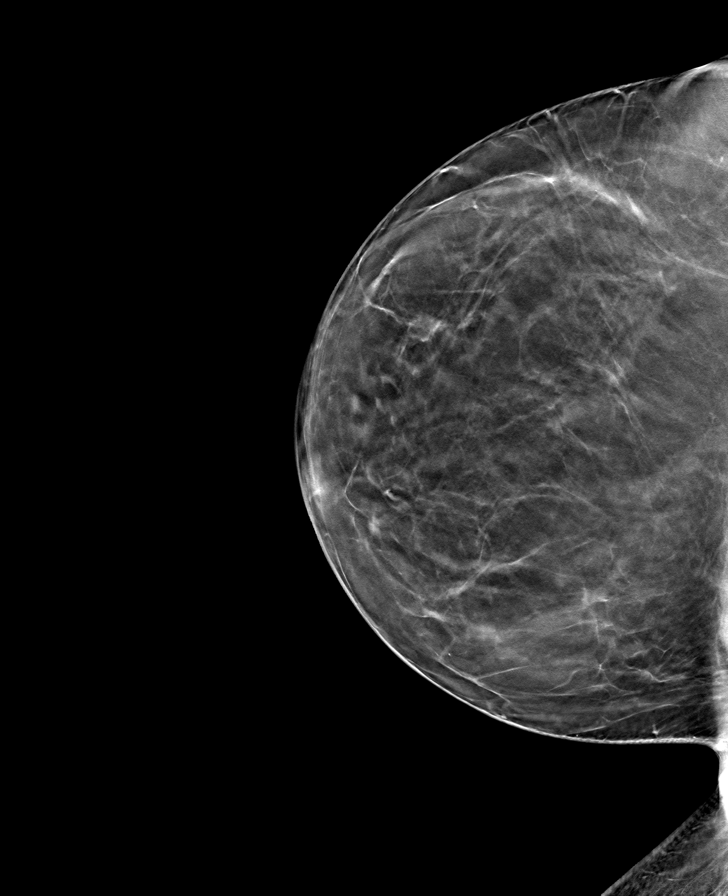

[8 of 24 positions shown; findings below may reference images not displayed]

ACR Breast Density Category b: There are scattered areas of
fibroglandular density.
FINDINGS: There are no findings suspicious for malignancy.
IMPRESSION: No mammographic evidence of malignancy. A result letter of this
screening mammogram will be mailed directly to the patient.

RECOMMENDATION:
Screening mammogram in one year. (Code:[id number]-X-X7B)

BI-RADS CATEGORY  1: Negative.

## 2024-03-24 ENCOUNTER — Other Ambulatory Visit: Payer: Self-pay | Admitting: Family Medicine

## 2024-04-01 ENCOUNTER — Encounter: Payer: Self-pay | Admitting: Family Medicine

## 2024-04-01 ENCOUNTER — Telehealth: Payer: Self-pay

## 2024-04-01 NOTE — Telephone Encounter (Signed)
 Called pharmacy regarding Xanax  prescription. See mychart message.   Pharmacist needed diagnosis code.   Dx code provided. They will contact patient when rx is ready for pick up.   Chiquita JAYSON English, RN

## 2024-04-29 ENCOUNTER — Ambulatory Visit: Admitting: Primary Care

## 2024-04-29 ENCOUNTER — Encounter: Payer: Self-pay | Admitting: Primary Care

## 2024-04-29 VITALS — BP 128/66 | HR 73 | Temp 97.9°F | Ht 64.0 in | Wt 200.0 lb

## 2024-04-29 DIAGNOSIS — J302 Other seasonal allergic rhinitis: Secondary | ICD-10-CM | POA: Diagnosis not present

## 2024-04-29 DIAGNOSIS — R053 Chronic cough: Secondary | ICD-10-CM

## 2024-04-29 DIAGNOSIS — J4521 Mild intermittent asthma with (acute) exacerbation: Secondary | ICD-10-CM

## 2024-04-29 DIAGNOSIS — J0100 Acute maxillary sinusitis, unspecified: Secondary | ICD-10-CM | POA: Diagnosis not present

## 2024-04-29 DIAGNOSIS — I10 Essential (primary) hypertension: Secondary | ICD-10-CM

## 2024-04-29 LAB — POCT EXHALED NITRIC OXIDE: FeNO level (ppb): 33

## 2024-04-29 MED ORDER — FLUTICASONE FUROATE-VILANTEROL 200-25 MCG/ACT IN AEPB: 1.0000 | INHALATION_SPRAY | Freq: Every day | RESPIRATORY_TRACT | 2 refills | Status: DC

## 2024-04-29 MED ORDER — ALBUTEROL SULFATE (2.5 MG/3ML) 0.083% IN NEBU: 2.5000 mg | INHALATION_SOLUTION | Freq: Four times a day (QID) | RESPIRATORY_TRACT | 1 refills | Status: DC | PRN

## 2024-04-29 MED ORDER — PREDNISONE 10 MG PO TABS: ORAL_TABLET | ORAL | 0 refills | Status: AC

## 2024-04-29 MED ORDER — METHYLPREDNISOLONE ACETATE 80 MG/ML IJ SUSP
80.0000 mg | Freq: Once | INTRAMUSCULAR | Status: AC
  Administered 2024-04-29: 80 mg via INTRAMUSCULAR

## 2024-04-29 MED ORDER — AZELASTINE HCL 0.1 % NA SOLN: 1.0000 | Freq: Two times a day (BID) | NASAL | 1 refills | Status: DC

## 2024-04-29 MED ORDER — ALBUTEROL SULFATE (2.5 MG/3ML) 0.083% IN NEBU
2.5000 mg | INHALATION_SOLUTION | Freq: Once | RESPIRATORY_TRACT | Status: AC
  Administered 2024-04-29: 2.5 mg via RESPIRATORY_TRACT

## 2024-04-29 NOTE — Patient Instructions (Addendum)
  VISIT SUMMARY: Today, you were seen for a persistent cough and wheezing that have been ongoing for two months. You have tried various treatments without relief. Your symptoms include chest congestion, tightness, and a worsening voice by the afternoon. A recent sinus scan showed fluid and mucosal thickening, and a chest X-ray was negative. You have a history of chronic cough and asthma, and your current medications include Breo, clindamycin , and Singulair .  YOUR PLAN: -ASTHMA WITH ACUTE EXACERBATION: Asthma is a condition where your airways narrow and swell, producing extra mucus, which can make breathing difficult. You are experiencing an acute exacerbation, likely worsened by seasonal changes and sinus issues. Today, you received a steroid shot and an albuterol  nebulizer treatment in the office. You will start a prednisone  taper tomorrow, continue using the Breo inhaler, and use the albuterol  nebulizer during exacerbations. Avoid using other nebulizers that overlap with Breo  -ACUTE MAXILLARY SINUSITIS, BILATERAL: Acute maxillary sinusitis is an infection of the sinuses causing fluid buildup and mucosal thickening. Despite treatment with clindamycin , your symptoms persist. You should continue taking clindamycin  as prescribed, resume using Flonase  nasal spray, and start using Astelin  nasal spray twice a day. Follow up with your ENT after completing the antibiotics.  -ALLERGIC RHINITIS: Allergic rhinitis is an allergic reaction that causes sneezing, congestion, and postnasal drip. It may be contributing to your respiratory symptoms. You should use Mucinex 1200 mg twice a day, resume Flonase  nasal spray, and start using Astelin  nasal spray twice a day. Monitor your blood pressure while using Zyrtec D for one week, then switch to regular Zyrtec. Consider biologics if symptoms recur.  -HYPERTENSION: Hypertension is high blood pressure, which is currently well-controlled with your medication. Your blood  pressure today was 128/66 mmHg. Continue to monitor your blood pressure at home, especially while using Zyrtec D.  INSTRUCTIONS: Follow up with your ENT after completing the antibiotics for your sinusitis. Monitor your blood pressure at home while using Zyrtec D for one week, then switch to regular Zyrtec. Consider allergy shots if your symptoms are recurrent.  Orders: FENO re: cough Albuterol  nebulizer x 1  Depo-medrol  80mg  x1 Please provide patient with flutter valve   RX: Prednisone  taper - start tomorrow (sent) Astelin  nasal spray- twice daily (sent)  Follow-up Please schedule follow-up visit in 6 weeks with Dr. Kara fu asthmatic bronchitis

## 2024-04-29 NOTE — Progress Notes (Signed)
 @Patient  ID: Miranda Baker, female    DOB: [birth year], 63 y.o.   MRN: [id number]  Chief Complaint  Patient presents with   Medical Management of Chronic Issues    Voice is hoarse. Chest congestion- unable to cough it up. Denies fever, pt states she does not have chest pain, but chest tightness from coughing. X2 months     Referring provider: Rosalynn Camie CROME, MD  HPI: 63 year old female, never smoker. History of GERD, hypertension and coronary artery disease who was referred to pulmonary clinic for cough.    Previous [facility] pulmonary encounter: 10/04/22 note reviewed from primary care team. She reported cough and hoarseness of voice for several months. She was diagnosed with GERD by [facility], who recommended referral to pulmonologist for further evaluation of cough. She is taking pantoprazole  40mg  daily.    She thinks she had RSV infection October 2023. She did 2 rounds of steroids to help her breathing.    Her chest feels heavy, pressure like. Felt similar to gerd. Better with breo, but not alleviated. She has exertional dyspnea.    10/11/22 seen by Dr. Ladona of Cardiology. Noted to have cough and wheezing on exam. She was prescribed breo ellipta  1 puff daily at this visit. She stopped breo 2 weeks ago. It did help her symptoms, but her cough and wheezing did not resolve.    She is being evaluated for right knee surgery.    She had asthma in childhood, that dissipated. She had second hand smoke in childhood. Smoked for 1 year at age 22. She has 1 dog. Gradnmother died of lung cancer. Her husband is double lung transplant for IPF. She is a Librarian, academic.    04/29/2024- Interim hx  Patient was seen for consult in April 2024 by Dr. Kara for chronic cough Reports chronic cough along with wheezing and chest tightness concerning for intermittent reactive airway disease Ordered for PFTs, HRCT and labs  Advised to resume BREO and start flonase  nasal spray  Recommended sleeping with HOB elevated to  reduce nocturnal reflux  Discussed the use of AI scribe software for clinical note transcription with the patient, who gave verbal consent to proceed.  History of Present Illness Miranda Baker is a 63 year old female with chronic cough who presents with persistent cough and wheezing.  Her symptoms began approximately two months ago, around Labor Day, initially perceived as allergies. She experienced chest congestion, cough, and chest tightness. She reports that during an urgent care visit, she was told she had bronchitis and was prescribed a Z-Pak, a five-day course of steroids, and albuterol , but these treatments did not alleviate her symptoms.  She consulted an ENT specialist who performed a sinus scan revealing fluid in the right maxillary sinus and moderate mucosal thickening in the left maxillary sinus. She was prescribed clindamycin  and Flonase , but reports no improvement. A chest X-ray was negative. She has been using Zyrtec, Breo inhaler, and her husband's inhalers without relief. She experiences wheezing and coughing, sometimes waking her at night, and notes that her voice deteriorates by the afternoon.  Her past medical history includes a chronic cough for which she saw Dr. Kara in April 2024. At that time, a CT scan of her chest was unremarkable, showing no pneumonia, lung masses, pulmonary nodules, or interstitial lung disease. She was treated with Breo, which eventually improved her symptoms. Normal pulmonary function testing in April 2024, FEV1 83%.  IGE 79, Eos absolute 400.   Currently,  she is taking her last three doses of Breo, clindamycin , and occasionally uses an inhaler or nebulizer treatment for severe symptoms. She discontinued the nasal spray as she felt it worsened her condition. She also uses saline solution for sinus rinses and takes Singulair  every night. She has tried Zyrtec and Zyrtec D for pressure relief.  Family history is significant for asthma in her children. She  has a dog at home. She monitors her blood pressure, which is controlled with medication, and notes that decongestants can raise her blood pressure.  04/29/2024 >> FENO 33 This result suggests intermediate (25-49) Type 2 (T2) airway inflammation; clinical correlation required.   Allergies  Allergen Reactions   Penicillins Anaphylaxis   Morphine And Codeine  Nausea And Vomiting and Other (See Comments)   Propofol      Cardiac dysrhthmias after EGD   Bactrim  [Sulfamethoxazole -Trimethoprim ] Nausea Only    Intolerance only   Erythromycin Nausea And Vomiting and Rash   Tetracyclines & Related Nausea And Vomiting and Rash   Vibramycin [Doxycycline Calcium ] Nausea And Vomiting and Rash    Immunization History  Administered Date(s) Administered   Influenza Whole 06/05/2010   Influenza,inj,Quad PF,6+ Mos 06/14/2014, 05/02/2017, 09/28/2018, 05/03/2019   PFIZER(Purple Top)SARS-COV-2 Vaccination 11/06/2019, 01/04/2020    Past Medical History:  Diagnosis Date   Anxiety    Cellulitis    Complication of anesthesia    propofol  caused dysrhytmias and patient had to be admitted   Constipation    Coronary artery disease    Stent 2.75x15 Xience 2011   Depression    GERD (gastroesophageal reflux disease)    Hyperlipidemia    Hypertension    Laboratory examination 09/24/2018   MI (mitral incompetence)    Paresthesia 09/24/2018   PONV (postoperative nausea and vomiting) 10/24/2020    Tobacco History: Social History   Tobacco Use  Smoking Status Never  Smokeless Tobacco Never   Counseling given: Not Answered   Outpatient Medications Prior to Visit  Medication Sig Dispense Refill   albuterol  (VENTOLIN  HFA) 108 (90 Base) MCG/ACT inhaler Inhale 2 puffs into the lungs.     ALPRAZolam  (XANAX ) 1 MG tablet TAKE ONE TABLET BY MOUTH UP TO 3 TIMES DAILY AS NEEDED FOR ANXIETY 90 tablet 1   aspirin  81 MG chewable tablet Chew 81 mg by mouth daily.     cholecalciferol (VITAMIN D3) 25 MCG (1000  UNIT) tablet Take 1,000 Units by mouth.     citalopram  (CELEXA ) 40 MG tablet TAKE 1 TABLET BY MOUTH EVERY DAY 90 tablet 2   clindamycin  (CLEOCIN ) 300 MG capsule Take one capsule three times daily with meals for 14 days.     ezetimibe  (ZETIA ) 10 MG tablet TAKE 1 TABLET BY MOUTH EVERY DAY 90 tablet 3   methylphenidate  (METADATE  ER) 20 MG ER tablet TAKE ONE TABLET BY MOUTH EACH MORNING AND REPEAT ONCE AT NOON DAILY AS DIRECTED 180 tablet 0   metoprolol  tartrate (LOPRESSOR ) 25 MG tablet TAKE 1 TABLET BY MOUTH EVERY DAY 90 tablet 3   montelukast  (SINGULAIR ) 10 MG tablet TAKE 1 TABLET BY MOUTH EVERYDAY AT BEDTIME 90 tablet 3   nitroGLYCERIN  (NITROSTAT ) 0.4 MG SL tablet Place 1 tablet (0.4 mg total) under the tongue every 5 (five) minutes x 3 doses as needed for chest pain. 30 tablet 3   olmesartan  (BENICAR ) 40 MG tablet TAKE 1 TABLET BY MOUTH EVERY DAY 90 tablet 3   pantoprazole  (PROTONIX ) 40 MG tablet TAKE 1 TABLET BY MOUTH EVERY DAY 90 tablet 3  PATADAY  0.2 % SOLN INSTILL ONE DROP INTO THE AFFECTED EYE(S) ONCE DAILY AS DIRECTED (Patient taking differently: 1 drop See admin instructions. In affected eye as needed for allergic conjuctvitis) 2.5 mL 12   rosuvastatin  (CRESTOR ) 20 MG tablet TAKE 1 TABLET BY MOUTH EVERY DAY 90 tablet 3   traMADol  (ULTRAM ) 50 MG tablet Take 1 tablet (50 mg total) by mouth every 6 (six) hours as needed for severe pain. 20 tablet 0   No facility-administered medications prior to visit.    Review of Systems  Review of Systems  Constitutional: Negative.  Negative for fever.  HENT:  Positive for congestion.   Respiratory:  Positive for cough and wheezing.    Physical Exam  BP 128/66   Pulse 73   Temp 97.9 F (36.6 C)   Ht 5' 4 (1.626 m)   Wt 200 lb (90.7 kg)   SpO2 97% Comment: RA  BMI 34.33 kg/m  Physical Exam Constitutional:      General: She is not in acute distress.    Appearance: Normal appearance.  HENT:     Head: Normocephalic and atraumatic.   Cardiovascular:     Rate and Rhythm: Normal rate and regular rhythm.  Pulmonary:     Effort: Pulmonary effort is normal.     Breath sounds: Wheezing and rhonchi present.  Skin:    General: Skin is warm and dry.  Neurological:     General: No focal deficit present.     Mental Status: She is alert and oriented to person, place, and time. Mental status is at baseline.  Psychiatric:        Mood and Affect: Mood normal.        Behavior: Behavior normal.        Thought Content: Thought content normal.        Judgment: Judgment normal.     Lab Results:  CBC    Component Value Date/Time   WBC 5.4 10/23/2023 0843   WBC 7.5 11/18/2022 0944   RBC 4.82 10/23/2023 0843   RBC 4.72 11/18/2022 0944   HGB 13.7 10/23/2023 0843   HCT 43.5 10/23/2023 0843   PLT 289 10/23/2023 0843   MCV 90 10/23/2023 0843   MCH 28.4 10/23/2023 0843   MCH 29.6 01/07/2019 0639   MCHC 31.5 10/23/2023 0843   MCHC 33.2 11/18/2022 0944   RDW 12.9 10/23/2023 0843   LYMPHSABS 2.4 11/18/2022 0944   LYMPHSABS 2.7 09/21/2018 1136   MONOABS 0.6 11/18/2022 0944   EOSABS 0.4 11/18/2022 0944   EOSABS 0.3 09/21/2018 1136   BASOSABS 0.1 11/18/2022 0944   BASOSABS 0.1 09/21/2018 1136    BMET    Component Value Date/Time   NA 140 10/23/2023 0843   K 4.6 10/23/2023 0843   CL 105 10/23/2023 0843   CO2 22 10/23/2023 0843   GLUCOSE 87 10/23/2023 0843   GLUCOSE 97 11/22/2019 1330   BUN 14 10/23/2023 0843   CREATININE 0.95 10/23/2023 0843   CREATININE 0.89 07/27/2014 0812   CALCIUM  10.2 10/23/2023 0843   GFRNONAA 58 (L) 09/29/2020 1419   GFRAA 67 09/29/2020 1419    BNP No results found for: BNP  ProBNP No results found for: PROBNP  Imaging: No results found.   Assessment & Plan:   1. Mild intermittent asthmatic bronchitis with acute exacerbation (Primary)  2. Acute maxillary sinusitis, recurrence not specified  3. Seasonal allergic rhinitis, unspecified trigger  Assessment and Plan Assessment  & Plan Intermittent asthmatic bronchitis with acute  exacerbation  Intermittent wheezing and cough for two months, with chest congestion and tightness. Wheezing throughout the lungs. Likely asthmatic bronchitis exacerbated by seasonal changes and sinus issues. - Administer  depo-medrol  80mg  IM  - Start prednisone  taper tomorrow - Refill Breo 200mcg inhaler - Provide albuterol  nebulizer for use during exacerbations - Use Mucinex 1200 mg twice a day - Order flutter valve to use three times a day for pulmonary clearance  - Consider Biologics at follow-up if continues to have recurrent exacerbations   Acute maxillary sinusitis, bilateral CT scan showed fluid level in the right maxillary sinus and moderate mucosal thickening of the left maxillary sinus. Persistent symptoms despite clindamycin  treatment. Sinus infection contributing to respiratory symptoms. - Continue clindamycin  as prescribed - Resume Flonase  nasal spray - Prescribe Astelin  (azelastine ) nasal spray twice a day - Follow up with ENT after completing antibiotics  Allergic rhinitis Symptoms include postnasal drip and sinus congestion. Potential allergy-related component to respiratory symptoms. Family history of asthma and allergies. Consistency with medications is important for management. - Resume Flonase  nasal spray - Prescribe Astelin  (azelastine ) nasal spray twice a day - Monitor blood pressure while using Zyrtec D for one week, then switch to regular Zyrtec  Hypertension Blood pressure is well-controlled with current medication. Current reading is 128/66 mmHg. Monitor due to potential increase from decongestants. - Monitor blood pressure at home while using Zyrtec D  Recording duration: 17 minutes       Almarie LELON Ferrari, NP 04/29/2024

## 2024-05-04 ENCOUNTER — Ambulatory Visit: Admitting: Primary Care

## 2024-05-10 ENCOUNTER — Ambulatory Visit: Admitting: Family Medicine

## 2024-05-10 ENCOUNTER — Other Ambulatory Visit (HOSPITAL_COMMUNITY): Payer: Self-pay

## 2024-05-10 ENCOUNTER — Other Ambulatory Visit: Payer: Self-pay

## 2024-05-10 ENCOUNTER — Encounter: Payer: Self-pay | Admitting: Family Medicine

## 2024-05-10 VITALS — BP 118/82 | Ht 64.0 in | Wt 194.0 lb

## 2024-05-10 DIAGNOSIS — M25562 Pain in left knee: Secondary | ICD-10-CM | POA: Diagnosis not present

## 2024-05-10 DIAGNOSIS — M25511 Pain in right shoulder: Secondary | ICD-10-CM

## 2024-05-10 MED ORDER — TRAMADOL HCL 50 MG PO TABS
50.0000 mg | ORAL_TABLET | Freq: Three times a day (TID) | ORAL | 0 refills | Status: DC | PRN
  Filled 2024-05-10: qty 10, 4d supply, fill #0

## 2024-05-10 NOTE — Patient Instructions (Addendum)
 I'm concerned you tore the medial meniscus in your left knee. We will go ahead with an MRI to assess - follow up with me (can be a virtual visit) for a no charge visit to go over results when these come back. Icing 15 minutes at a time as needed. Tylenol  500mg  1-2 tabs three times a day as needed for pain.   [facility] [address], [place], [place] [address] Phone: [phone]

## 2024-05-10 NOTE — Progress Notes (Signed)
 PCP: Rosalynn Camie CROME, MD  Subjective:   HPI: Patient is a 63 y.o. female here for left knee, right shoulder injuries.  Patient reports on 10/4 she stepped down off a curb and left leg went lateral causing pain in medial left knee. Also fell and landed onto right elbow and shoulder. Elbow improved but shoulder pain continues laterally. Tired ice/heat. No swelling. Has been walking ok but cannot lead with her left leg when she gets up due to medial left knee pain. No history of osteoporosis.  Past Medical History:  Diagnosis Date   Anxiety    Cellulitis    Complication of anesthesia    propofol  caused dysrhytmias and patient had to be admitted   Constipation    Coronary artery disease    Stent 2.75x15 Xience 2011   Depression    GERD (gastroesophageal reflux disease)    Hyperlipidemia    Hypertension    Laboratory examination 09/24/2018   MI (mitral incompetence)    Paresthesia 09/24/2018   PONV (postoperative nausea and vomiting) 10/24/2020    Current Outpatient Medications on File Prior to Visit  Medication Sig Dispense Refill   albuterol  (PROVENTIL ) (2.5 MG/3ML) 0.083% nebulizer solution Take 3 mLs (2.5 mg total) by nebulization every 6 (six) hours as needed for wheezing or shortness of breath. 75 mL 1   albuterol  (VENTOLIN  HFA) 108 (90 Base) MCG/ACT inhaler Inhale 2 puffs into the lungs.     ALPRAZolam  (XANAX ) 1 MG tablet TAKE ONE TABLET BY MOUTH UP TO 3 TIMES DAILY AS NEEDED FOR ANXIETY 90 tablet 1   aspirin  81 MG chewable tablet Chew 81 mg by mouth daily.     azelastine  (ASTELIN ) 0.1 % nasal spray Place 1 spray into both nostrils 2 (two) times daily. Use in each nostril as directed 30 mL 1   cholecalciferol (VITAMIN D3) 25 MCG (1000 UNIT) tablet Take 1,000 Units by mouth.     citalopram  (CELEXA ) 40 MG tablet TAKE 1 TABLET BY MOUTH EVERY DAY 90 tablet 2   ezetimibe  (ZETIA ) 10 MG tablet TAKE 1 TABLET BY MOUTH EVERY DAY 90 tablet 3   fluticasone  furoate-vilanterol (BREO  ELLIPTA) 200-25 MCG/ACT AEPB Inhale 1 puff into the lungs daily. 60 each 2   methylphenidate  (METADATE  ER) 20 MG ER tablet TAKE ONE TABLET BY MOUTH EACH MORNING AND REPEAT ONCE AT NOON DAILY AS DIRECTED 180 tablet 0   metoprolol  tartrate (LOPRESSOR ) 25 MG tablet TAKE 1 TABLET BY MOUTH EVERY DAY 90 tablet 3   montelukast  (SINGULAIR ) 10 MG tablet TAKE 1 TABLET BY MOUTH EVERYDAY AT BEDTIME 90 tablet 3   nitroGLYCERIN  (NITROSTAT ) 0.4 MG SL tablet Place 1 tablet (0.4 mg total) under the tongue every 5 (five) minutes x 3 doses as needed for chest pain. 30 tablet 3   olmesartan  (BENICAR ) 40 MG tablet TAKE 1 TABLET BY MOUTH EVERY DAY 90 tablet 3   pantoprazole  (PROTONIX ) 40 MG tablet TAKE 1 TABLET BY MOUTH EVERY DAY 90 tablet 3   PATADAY  0.2 % SOLN INSTILL ONE DROP INTO THE AFFECTED EYE(S) ONCE DAILY AS DIRECTED (Patient taking differently: 1 drop See admin instructions. In affected eye as needed for allergic conjuctvitis) 2.5 mL 12   predniSONE  (DELTASONE ) 10 MG tablet Take 4 tablets (40 mg total) by mouth daily for 3 days, THEN 3 tablets (30 mg total) daily for 3 days, THEN 2 tablets (20 mg total) daily for 3 days, THEN 1 tablet (10 mg total) daily for 3 days. 30 tablet 0  rosuvastatin  (CRESTOR ) 20 MG tablet TAKE 1 TABLET BY MOUTH EVERY DAY 90 tablet 3   No current facility-administered medications on file prior to visit.    Past Surgical History:  Procedure Laterality Date   COLONOSCOPY WITH ESOPHAGOGASTRODUODENOSCOPY (EGD)     01/05/2019   ESOPHAGOGASTRODUODENOSCOPY N/A 10/07/2012   Procedure: ESOPHAGOGASTRODUODENOSCOPY (EGD);  Surgeon: Elsie Cree, MD;  Location: [facility];  Service: Endoscopy;  Laterality: N/A;   HERNIA REPAIR     KNEE ARTHROSCOPY WITH MEDIAL MENISECTOMY Right 12/26/2022   Procedure: KNEE ARTHROSCOPY WITH MEDIAL MENISECTOMY;  Surgeon: Cristy Bonner DASEN, MD;  Location: [facility];  Service: Orthopedics;  Laterality: Right;   lap for adhesions     LEFT HEART  CATHETERIZATION WITH CORONARY ANGIOGRAM N/A 01/21/2012   Procedure: LEFT HEART CATHETERIZATION WITH CORONARY ANGIOGRAM;  Surgeon: Erick JONELLE Bergamo, MD;  Location[facility];  Service: Cardiovascular;  Laterality: N/A;   OOPHORECTOMY     SHOULDER ARTHROSCOPY Left 11/25/2019   Procedure: SHOULDER ARTHROSCOPY WITH DEBRIDEMENT EXTENSIVE WITH LYSIS OF ADHESIONS;  Surgeon: Cristy Bonner DASEN, MD;  Location: [facility];  Service: Orthopedics;  Laterality: Left;   SHOULDER CLOSED REDUCTION Left 11/25/2019   Procedure: CLOSED MANIPULATION SHOULDER;  Surgeon: Cristy Bonner DASEN, MD;  Location: [facility];  Service: Orthopedics;  Laterality: Left;   SYNOVECTOMY Right 12/26/2022   Procedure: SYNOVECTOMY;  Surgeon: Cristy Bonner DASEN, MD;  Location:  SURGERY CENTER;  Service: Orthopedics;  Laterality: Right;   TOTAL ABDOMINAL HYSTERECTOMY  [facility]ot[facility]    Allergies  Allergen Reactions   Penicillins Anaphylaxis   Morphine And Codeine  Nausea And Vomiting and Other (See Comments)   Propofol      Cardiac dysrhthmias after EGD   Bactrim  [Sulfamethoxazole -Trimethoprim ] Nausea Only    Intolerance only   Erythromycin Nausea And Vomiting and Rash   Tetracyclines & Related Nausea And Vomiting and Rash   Vibramycin [Doxycycline Calcium ] Nausea And Vomiting and Rash    BP 118/82   Ht 5' 4 (1.626 m)   Wt 194 lb (88 kg)   BMI 33.30 kg/m       No data to display              No data to display              Objective:  Physical Exam:  Gen: NAD, comfortable in exam room  Left knee: No gross deformity, ecchymoses, swelling. TTP medial joint line.  No other tenderness. FROM with normal strength. Negative ant/post drawers. Negative valgus/varus testing. Negative lachman. Positive mcmurrays, apleys. NV intact distally.  Right shoulder: No swelling, ecchymoses.  No gross deformity. No TTP AC or biceps tendon.  Tender over trapezius/suparspinatus body. FROM with  painful arc. Negative Hawkins, Neers. Negative Yergasons. Strength 5/5 with empty can and resisted internal/external rotation.  Pain empty can and ER. NV intact distally.  Limited MSK u/s right shoulder:  Subscapularis and infraspinatus appear normal without tears.  Supraspinatus has a small hypoechoic area anteriorly, interstitial but no visible tear.     Assessment & Plan:  1. Left knee pain - concerning for acute medial meniscus tear.  Will proceed with MRI to assess.  Knee brace, icing, tylenol .  2. Right Shoulder pain - due to supraspinatus strain.  Home exercises after short period of rest.  Icing, tylenol  if needed.  Short course of tramadol  to take for severe pain as needed.

## 2024-05-12 ENCOUNTER — Encounter: Payer: Self-pay | Admitting: Family Medicine

## 2024-05-14 ENCOUNTER — Ambulatory Visit (HOSPITAL_BASED_OUTPATIENT_CLINIC_OR_DEPARTMENT_OTHER)
Admission: RE | Admit: 2024-05-14 | Discharge: 2024-05-14 | Disposition: A | Discharge status: Home / Self Care | Source: Ambulatory Visit | Attending: Family Medicine | Admitting: Family Medicine

## 2024-05-14 DIAGNOSIS — M25562 Pain in left knee: Secondary | ICD-10-CM | POA: Insufficient documentation

## 2024-05-14 DIAGNOSIS — S8992XA Unspecified injury of left lower leg, initial encounter: Secondary | ICD-10-CM | POA: Diagnosis not present

## 2024-05-19 ENCOUNTER — Ambulatory Visit: Admitting: Family Medicine

## 2024-05-19 ENCOUNTER — Other Ambulatory Visit: Payer: Self-pay

## 2024-05-19 VITALS — BP 130/78 | Ht 64.0 in | Wt 194.0 lb

## 2024-05-19 DIAGNOSIS — M25511 Pain in right shoulder: Secondary | ICD-10-CM | POA: Diagnosis not present

## 2024-05-19 MED ORDER — METHYLPREDNISOLONE ACETATE 40 MG/ML IJ SUSP
40.0000 mg | Freq: Once | INTRAMUSCULAR | Status: AC
  Administered 2024-05-19: 40 mg via INTRA_ARTICULAR

## 2024-05-19 NOTE — Patient Instructions (Signed)
 Today you received an injection with corticosteroid. This injection is usually done in response to pain and inflammation. There is some numbing medicine also in the shot so the injected area may be numb and feel really good for the next couple of hours. The numbing medicine usually wears off in 2-3 hours though, and then your pain level will be right back where it was before the injection.   The actually benefit from the steroid injection is usually noticed in 2-7 days. You may actually experience a small (as in 10%) INCREASE in pain in the first 24 hours---that is common.   Things to watch out for that you should contact us  or a health care provider urgently would include:  1. Unusual (as in more than 10%) increase in pain  2. New fever > 101.5  3. New swelling or redness of the injected area.  4. Streaking of red lines around the area injected.   Wait about 5 days before returning to lifting things and your exercises.

## 2024-05-19 NOTE — Progress Notes (Signed)
 Patient returns with worsening right shoulder pain.  Discussed risks/benefits of steroid injection including warning about possible weakening further of her supraspinatus strain and she would like to proceed with subacromial injection.  To let us  know in a week how she's doing.  We also reviewed her left knee MRI results - small contusion but otherwise reassuring.  After informed written consent timeout was performed, patient was seated in chair in exam room. Right shoulder was prepped with alcohol swab and utilizing lateral approach with ultrasound guidance, patient's right subacromial space was injected with 3:1 lidocaine : depomedrol. Patient tolerated the procedure well without immediate complications.

## 2024-06-04 ENCOUNTER — Other Ambulatory Visit: Payer: Self-pay | Admitting: Family Medicine

## 2024-06-06 ENCOUNTER — Other Ambulatory Visit: Payer: Self-pay | Admitting: Family Medicine

## 2024-06-16 ENCOUNTER — Ambulatory Visit (INDEPENDENT_AMBULATORY_CARE_PROVIDER_SITE_OTHER): Admitting: Family Medicine

## 2024-06-16 ENCOUNTER — Encounter: Payer: Self-pay | Admitting: Family Medicine

## 2024-06-16 VITALS — BP 124/62 | HR 69 | Ht 64.0 in | Wt 200.0 lb

## 2024-06-16 DIAGNOSIS — L989 Disorder of the skin and subcutaneous tissue, unspecified: Secondary | ICD-10-CM

## 2024-06-16 DIAGNOSIS — M25511 Pain in right shoulder: Secondary | ICD-10-CM

## 2024-06-16 DIAGNOSIS — E7800 Pure hypercholesterolemia, unspecified: Secondary | ICD-10-CM | POA: Diagnosis not present

## 2024-06-16 MED ORDER — ROSUVASTATIN CALCIUM 20 MG PO TABS: 20.0000 mg | ORAL_TABLET | Freq: Every day | ORAL | 3 refills | Status: AC

## 2024-06-16 MED ORDER — ALPRAZOLAM 1 MG PO TABS
ORAL_TABLET | ORAL | 3 refills | Status: AC
Start: 2024-06-16 — End: ?

## 2024-06-16 MED ORDER — METHYLPHENIDATE HCL ER 20 MG PO TBCR: EXTENDED_RELEASE_TABLET | ORAL | 0 refills | Status: DC

## 2024-06-16 MED ORDER — EZETIMIBE 10 MG PO TABS: 10.0000 mg | ORAL_TABLET | Freq: Every day | ORAL | 3 refills | Status: AC

## 2024-06-16 MED ORDER — METOPROLOL TARTRATE 25 MG PO TABS: 25.0000 mg | ORAL_TABLET | Freq: Every day | ORAL | 3 refills | Status: AC

## 2024-06-16 MED ORDER — OLMESARTAN MEDOXOMIL 40 MG PO TABS: 40.0000 mg | ORAL_TABLET | Freq: Every day | ORAL | 3 refills | Status: AC

## 2024-06-16 MED ORDER — MONTELUKAST SODIUM 10 MG PO TABS: 10.0000 mg | ORAL_TABLET | Freq: Every day | ORAL | 3 refills | Status: AC

## 2024-06-16 MED ORDER — CITALOPRAM HYDROBROMIDE 40 MG PO TABS
40.0000 mg | ORAL_TABLET | Freq: Every day | ORAL | 2 refills | Status: AC
Start: 2024-06-16 — End: ?

## 2024-06-17 ENCOUNTER — Encounter: Payer: Self-pay | Admitting: Family Medicine

## 2024-06-17 ENCOUNTER — Telehealth: Payer: Self-pay

## 2024-06-17 ENCOUNTER — Other Ambulatory Visit (HOSPITAL_COMMUNITY): Payer: Self-pay

## 2024-06-17 DIAGNOSIS — M25511 Pain in right shoulder: Secondary | ICD-10-CM | POA: Diagnosis not present

## 2024-06-17 DIAGNOSIS — M6281 Muscle weakness (generalized): Secondary | ICD-10-CM | POA: Diagnosis not present

## 2024-06-17 NOTE — Telephone Encounter (Signed)
 Pharmacy Patient Advocate Encounter   Received notification from Patient Advice Request messages that prior authorization for METHYLPHENIDATE  is required/requested.   Insurance verification completed.   The patient is insured through [facility] ADVANTAGE/RX ADVANCE.   Awaiting recent chart notes to close.

## 2024-06-18 MED ORDER — METHYLPHENIDATE HCL ER 20 MG PO TBCR: EXTENDED_RELEASE_TABLET | ORAL | 0 refills | Status: DC

## 2024-06-18 NOTE — Progress Notes (Signed)
    CHIEF COMPLAINT / HPI: Right shoulder pain.  Had a steroid injection few months ago and it helped for a little while but pain is returning quickly.  Cannot really raise arm above shoulder level. 2.  Lesion on the right cheek and 2 smaller lesions on the left cheek which are concerning her.  The 1 on the right cheek seems to be getting bigger.  The 1 on the left cheek occasionally will bleed just a little bit if she scratches it. 3.  Needs some refills.   PERTINENT  PMH / PSH: I have reviewed the patient's medications, allergies, past medical and surgical history, smoking status and updated in the EMR as appropriate.   OBJECTIVE:  BP 124/62 (BP Location: Left Arm, Patient Position: Sitting, Cuff Size: Normal)   Pulse 69   Ht 5' 4 (1.626 m)   Wt 200 lb (90.7 kg)   SpO2 98%   BMI 34.33 kg/m  GENERAL: Well-developed no acute distress SKIN: Right cheek is a 7 mm well-circumscribed red slightly raised papule.  There is a copy of the image in media tab.  Left cheek has 2 very small 2 to 4 mm papules that are skin colored, borders regular. Shoulder: Right: Pain with abduction, forward flexion.  Resisted supraspinatus testing most painful.   IMAGING: I reviewed the ultrasound pictures she had previously had performed.  There was no current report of the ultrasound but it appears to my read that she has a partial-thickness tear in the supraspinatus that originates from the bony side. ASSESSMENT / PLAN: #1.  Shoulder pain: Concern for partial-thickness rotator cuff tear. 2.  Skin lesion on the right cheek that is somewhat concerning.  Dermatology referral.  If she cannot get in to see them in relatively short time such as 2 to 4 weeks, she will let me know and we can perform small punch biopsy.  They can also assess the 1 on the left cheek but I do not think those are worrisome at this point. 3.  Reviewed chronic medications and gave refills.  No problem-specific Assessment & Plan notes found  for this encounter.   Camie Mulch MD

## 2024-06-21 NOTE — Telephone Encounter (Signed)
 Prior authorization submitted for METHYLPHENIDATE  ER 20MG  TABS to [employer] ADVANTAGE/RX ADVANCE via Latent.   Key: [id number]  No chart notes to attach.

## 2024-06-23 ENCOUNTER — Other Ambulatory Visit: Payer: Self-pay | Admitting: Family Medicine

## 2024-06-23 ENCOUNTER — Encounter: Payer: Self-pay | Admitting: Family Medicine

## 2024-06-23 MED ORDER — OSELTAMIVIR PHOSPHATE 75 MG PO CAPS: 75.0000 mg | ORAL_CAPSULE | Freq: Every day | ORAL | 0 refills | Status: AC

## 2024-06-23 MED ORDER — OSELTAMIVIR PHOSPHATE 75 MG PO CAPS: 75.0000 mg | ORAL_CAPSULE | Freq: Every day | ORAL | 0 refills | Status: DC

## 2024-06-23 NOTE — Telephone Encounter (Signed)
 Pharmacy Patient Advocate Encounter  Received notification from [facility] ADVANTAGE/RX ADVANCE that Prior Authorization for Methylphenidate  HCl ER 20MG  er tablets has been DENIED.  Full denial letter will be uploaded to the media tab. See denial reason below.    PA #/Case ID/Reference #: [id number]

## 2024-06-28 ENCOUNTER — Encounter: Payer: Self-pay | Admitting: Pulmonary Disease

## 2024-06-28 ENCOUNTER — Ambulatory Visit: Payer: Self-pay | Admitting: Pulmonary Disease

## 2024-06-28 VITALS — BP 119/74 | HR 62 | Ht 64.0 in | Wt 199.0 lb

## 2024-06-28 DIAGNOSIS — J302 Other seasonal allergic rhinitis: Secondary | ICD-10-CM

## 2024-06-28 DIAGNOSIS — K219 Gastro-esophageal reflux disease without esophagitis: Secondary | ICD-10-CM

## 2024-06-28 DIAGNOSIS — M25511 Pain in right shoulder: Secondary | ICD-10-CM | POA: Diagnosis not present

## 2024-06-28 DIAGNOSIS — J4520 Mild intermittent asthma, uncomplicated: Secondary | ICD-10-CM

## 2024-06-28 MED ORDER — DULERA 200-5 MCG/ACT IN AERO: 2.0000 | INHALATION_SPRAY | Freq: Two times a day (BID) | RESPIRATORY_TRACT | 11 refills | Status: DC

## 2024-06-28 NOTE — Assessment & Plan Note (Addendum)
 SABRA

## 2024-06-28 NOTE — Progress Notes (Signed)
 Established Patient Pulmonology Office Visit   Subjective:  Patient ID: Miranda Baker, female    DOB: [birth year]  MRN: [id number]  CC:  Chief Complaint  Patient presents with   Medical Management of Chronic Issues    Pt states all is well     Discussed the use of AI scribe software for clinical note transcription with the patient, who gave verbal consent to proceed.  History of Present Illness Miranda Baker is a 63 year old female with asthma who presents for a follow-up visit.  She was last seen on April 24, 2024, for an asthma exacerbation treated with a steroid taper. She uses Breo as needed, particularly during respiratory symptoms, but was not aware it should be used daily. She experiences intermittent coughing spells and voice changes, which she attributes to respiratory issues. She uses a nebulizer occasionally but has not needed it recently. She carries an albuterol  inhaler for emergencies.  She has sinus issues, with a CT scan showing fluid in the right maxillary sinus and moderate mucosal thickening in the left maxillary sinus. Ongoing sinus symptoms and occasional voice changes are present, possibly related to allergies. She has not been on prednisone  since the last taper, except for injections in her arms, the last of which was two weeks ago.  Her GERD is managed with rabeprazole 20 mg twice a day, with occasional breakthrough symptoms managed with Tums. No frequent heartburn or reflux symptoms.  She recently took Tamiflu  prophylactically after her husband was diagnosed with flu A. She experienced a low-grade fever for about a day and a half but did not become significantly ill.  She has transitioned to [facility] and is experiencing issues with prescription coverage, particularly with her inhalers. She uses Singulair  nightly and her current inhaler, Breo, may not be covered under her new plan.        Review of Systems  Constitutional:  Negative for chills, fever,  malaise/fatigue and weight loss.  HENT:  Negative for congestion, sinus pain and sore throat.   Eyes: Negative.   Respiratory:  Negative for cough, hemoptysis, sputum production, shortness of breath and wheezing.   Cardiovascular:  Negative for chest pain, palpitations, orthopnea, claudication and leg swelling.  Gastrointestinal:  Negative for abdominal pain, heartburn, nausea and vomiting.  Genitourinary: Negative.   Musculoskeletal:  Negative for joint pain and myalgias.  Skin:  Negative for rash.  Neurological:  Negative for weakness.  Endo/Heme/Allergies: Negative.   Psychiatric/Behavioral: Negative.        Current Outpatient Medications:    albuterol  (VENTOLIN  HFA) 108 (90 Base) MCG/ACT inhaler, Inhale 2 puffs into the lungs., Disp: , Rfl:    ALPRAZolam  (XANAX ) 1 MG tablet, TAKE ONE TABLET BY MOUTH UP TO 3 TIMES DAILY AS NEEDED FOR ANXIETY, Disp: 90 tablet, Rfl: 3   aspirin  81 MG chewable tablet, Chew 81 mg by mouth daily., Disp: , Rfl:    azelastine  (ASTELIN ) 0.1 % nasal spray, Place 1 spray into both nostrils 2 (two) times daily. Use in each nostril as directed, Disp: 30 mL, Rfl: 1   cholecalciferol (VITAMIN D3) 25 MCG (1000 UNIT) tablet, Take 1,000 Units by mouth., Disp: , Rfl:    citalopram  (CELEXA ) 40 MG tablet, Take 1 tablet (40 mg total) by mouth daily., Disp: 90 tablet, Rfl: 2   ezetimibe  (ZETIA ) 10 MG tablet, Take 1 tablet (10 mg total) by mouth daily., Disp: 90 tablet, Rfl: 3   methylphenidate  (METADATE  ER) 20 MG ER tablet, TAKE ONE TABLET  BY MOUTH EACH MORNING AND REPEAT ONCE AT NOON DAILY AS DIRECTED, Disp: 60 tablet, Rfl: 0   metoprolol  tartrate (LOPRESSOR ) 25 MG tablet, Take 1 tablet (25 mg total) by mouth daily., Disp: 90 tablet, Rfl: 3   mometasone-formoterol (DULERA ) 200-5 MCG/ACT AERO, Inhale 2 puffs into the lungs 2 (two) times daily., Disp: 1 each, Rfl: 11   montelukast  (SINGULAIR ) 10 MG tablet, Take 1 tablet (10 mg total) by mouth at bedtime. TAKE 1 TABLET BY  MOUTH EVERYDAY AT BEDTIME, Disp: 90 tablet, Rfl: 3   nitroGLYCERIN  (NITROSTAT ) 0.4 MG SL tablet, Place 1 tablet (0.4 mg total) under the tongue every 5 (five) minutes x 3 doses as needed for chest pain., Disp: 30 tablet, Rfl: 3   olmesartan  (BENICAR ) 40 MG tablet, Take 1 tablet (40 mg total) by mouth daily., Disp: 90 tablet, Rfl: 3   oseltamivir  (TAMIFLU ) 75 MG capsule, Take 1 capsule (75 mg total) by mouth daily., Disp: 10 capsule, Rfl: 0   oseltamivir  (TAMIFLU ) 75 MG capsule, Take 1 capsule (75 mg total) by mouth daily for 10 days., Disp: 10 capsule, Rfl: 0   PATADAY  0.2 % SOLN, INSTILL ONE DROP INTO THE AFFECTED EYE(S) ONCE DAILY AS DIRECTED (Patient taking differently: 1 drop See admin instructions. In affected eye as needed for allergic conjuctvitis), Disp: 2.5 mL, Rfl: 12   RABEprazole (ACIPHEX) 20 MG tablet, Take 20 mg by mouth 2 (two) times daily., Disp: , Rfl:    rosuvastatin  (CRESTOR ) 20 MG tablet, Take 1 tablet (20 mg total) by mouth daily., Disp: 90 tablet, Rfl: 3   traMADol  (ULTRAM ) 50 MG tablet, Take 1 tablet (50 mg total) by mouth every 8 (eight) hours as needed for severe pain (pain score 7-10)., Disp: 10 tablet, Rfl: 0      Objective:  BP 119/74   Pulse 62   Ht 5' 4 (1.626 m) Comment: per pt  Wt 199 lb (90.3 kg)   SpO2 99%   BMI 34.16 kg/m     Physical Exam Constitutional:      General: She is not in acute distress.    Appearance: Normal appearance.  Eyes:     General: No scleral icterus.    Conjunctiva/sclera: Conjunctivae normal.  Cardiovascular:     Rate and Rhythm: Normal rate and regular rhythm.  Pulmonary:     Breath sounds: No wheezing, rhonchi or rales.  Musculoskeletal:     Right lower leg: No edema.     Left lower leg: No edema.  Skin:    General: Skin is warm and dry.  Neurological:     General: No focal deficit present.      Diagnostic Review:       Assessment & Plan:   Assessment & Plan Mild intermittent asthma without  complication  Orders:   mometasone-formoterol (DULERA ) 200-5 MCG/ACT AERO; Inhale 2 puffs into the lungs 2 (two) times daily.  Seasonal allergic rhinitis, unspecified trigger      Assessment and Plan Assessment & Plan Asthma with intermittent exacerbations and ongoing cough Intermittent exacerbations with ongoing cough, possibly related to sinus inflammation or allergies. Recent exacerbation treated with steroid taper. Current management includes Breo inhaler, but adherence is inconsistent. Consideration of biologic therapy (Dupixent) if frequent steroid use is required due to risks of osteoporosis and other side effects.  - Change prescription to Dulera  inhaler, two puffs in the morning and two puffs in the evening, due to insurance coverage issues with Breo. - Use albuterol  inhaler as needed for  cough or wheezing. - Will consider Dupixent if frequent steroid use is required. - Will recheck eosinophil count if another flare occurs. - Contact clinic if experiencing breathing difficulties before surgery.  Seasonal allergic rhinitis Ongoing sinus inflammation possibly contributing to respiratory symptoms. No current ENT follow-up unless issues arise. Potential for allergies to exacerbate asthma symptoms. - Continue current management with nasal spray as needed.  Gastroesophageal reflux disease without esophagitis GERD managed with rabeprazole 20 mg twice daily. Occasional breakthrough symptoms managed with famotidine as needed, though rarely used. No significant acid reflux symptoms reported recently. - Continue rabeprazole 20 mg twice daily. - Use famotidine as needed for breakthrough symptoms.      Return in about 6 months (around 12/26/2024) for f/u visit Miranda Baker.   Miranda KATHEE Kara, MD

## 2024-06-28 NOTE — Patient Instructions (Addendum)
 Use dulera  inhaler 2 puffs twice daily - rinse mouth out after each use  Use albuterol  inhaler 1-2 puffs every 4-6 hours as needed  Continue singulair  daily  We will consider Dupixent injection therapy if you continue to have flares in your asthma requiring prednisone   Follow up in 6 months, call sooner if needed

## 2024-07-06 DIAGNOSIS — M89511 Osteolysis, right shoulder: Secondary | ICD-10-CM | POA: Diagnosis not present

## 2024-07-06 DIAGNOSIS — M19011 Primary osteoarthritis, right shoulder: Secondary | ICD-10-CM | POA: Diagnosis not present

## 2024-07-06 DIAGNOSIS — M25511 Pain in right shoulder: Secondary | ICD-10-CM | POA: Diagnosis not present

## 2024-07-06 DIAGNOSIS — M7541 Impingement syndrome of right shoulder: Secondary | ICD-10-CM | POA: Diagnosis not present

## 2024-07-06 DIAGNOSIS — M7551 Bursitis of right shoulder: Secondary | ICD-10-CM | POA: Diagnosis not present

## 2024-07-06 DIAGNOSIS — M75121 Complete rotator cuff tear or rupture of right shoulder, not specified as traumatic: Secondary | ICD-10-CM | POA: Diagnosis not present

## 2024-07-06 DIAGNOSIS — M7521 Bicipital tendinitis, right shoulder: Secondary | ICD-10-CM | POA: Diagnosis not present

## 2024-07-13 ENCOUNTER — Encounter: Payer: Self-pay | Admitting: Family Medicine

## 2024-07-13 MED ORDER — METHYLPHENIDATE HCL ER 20 MG PO TBCR: EXTENDED_RELEASE_TABLET | ORAL | 0 refills | Status: DC

## 2024-07-25 ENCOUNTER — Encounter: Payer: Self-pay | Admitting: Pulmonary Disease

## 2024-07-25 ENCOUNTER — Encounter: Payer: Self-pay | Admitting: Family Medicine

## 2024-07-26 ENCOUNTER — Telehealth: Payer: Self-pay

## 2024-07-26 DIAGNOSIS — J4520 Mild intermittent asthma, uncomplicated: Secondary | ICD-10-CM

## 2024-07-26 MED ORDER — FLUTICASONE-SALMETEROL 250-50 MCG/ACT IN AEPB: 1.0000 | INHALATION_SPRAY | Freq: Two times a day (BID) | RESPIRATORY_TRACT | 11 refills | Status: AC

## 2024-07-26 MED ORDER — METHYLPHENIDATE HCL 20 MG PO TABS: 20.0000 mg | ORAL_TABLET | Freq: Every day | ORAL | 0 refills | Status: AC

## 2024-07-26 NOTE — Telephone Encounter (Signed)
 Sent in script for fluticasone -salmeterol 250-50mcg 1 puff twice daily. - rinse mouth out after each use.  It is a good inhaler as well, compared to the other inhalers previously sent in.  JD

## 2024-07-27 NOTE — Telephone Encounter (Signed)
 Tried to reach out to patient new Rx sent to pharmacy

## 2024-08-09 ENCOUNTER — Encounter (HOSPITAL_BASED_OUTPATIENT_CLINIC_OR_DEPARTMENT_OTHER): Payer: Self-pay | Admitting: Orthopaedic Surgery

## 2024-08-10 ENCOUNTER — Other Ambulatory Visit: Payer: Self-pay | Admitting: Family Medicine

## 2024-08-10 MED ORDER — OSELTAMIVIR PHOSPHATE 75 MG PO CAPS: 75.0000 mg | ORAL_CAPSULE | Freq: Two times a day (BID) | ORAL | 0 refills | Status: DC

## 2024-08-10 NOTE — H&P (Signed)
 "   PREOPERATIVE H&P  Chief Complaint: Complete tear of right rotator cuff, unspecified whether traumatic, Articular cartilage disorder of shoulder , Impingement syndrome of right shoulder  HPI: Miranda Baker is a 64 y.o. female who is scheduled for, Procedures: ARTHROSCOPY, SHOULDER, WITH ROTATOR CUFF REPAIR.   Patient has a past medical history significant for GERD, HTN.   Miranda Baker is a 64 year old female who presents for evaluation of right shoulder pain. She reports that the pain has been persistent since October and is associated with weakness and limited range of motion. 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 The patient has been experiencing significant discomfort, impacting her daily activities, including her work, which involves sitting and typing all day. She is concerned about the recovery process and the impact on her work schedule.   Symptoms are rated as moderate to severe, and have been worsening.  This is significantly impairing activities of daily living.    Please see clinic note for further details on this patient's care.    She has elected for surgical management.   Past Medical History:  Diagnosis Date   Anxiety    Cellulitis    Complication of anesthesia    propofol  caused dysrhytmias and patient had to be admitted   Constipation    Coronary artery disease    Stent 2.75x15 Xience 2011   Depression    GERD (gastroesophageal reflux disease)    Hyperlipidemia    Hypertension    Laboratory examination 09/24/2018   MI (mitral incompetence)    Paresthesia 09/24/2018   PONV (postoperative nausea and vomiting) 10/24/2020   Past Surgical History:  Procedure Laterality Date   COLONOSCOPY WITH ESOPHAGOGASTRODUODENOSCOPY (EGD)     01/05/2019   ESOPHAGOGASTRODUODENOSCOPY N/A 10/07/2012   Procedure: ESOPHAGOGASTRODUODENOSCOPY (EGD);  Surgeon: Elsie Cree, MD;  Location: [facility];  Service: Endoscopy;   Laterality: N/A;   HERNIA REPAIR     KNEE ARTHROSCOPY WITH MEDIAL MENISECTOMY Right 12/26/2022   Procedure: KNEE ARTHROSCOPY WITH MEDIAL MENISECTOMY;  Surgeon: Cristy Bonner DASEN, MD;  Location: [facility];  Service: Orthopedics;  Laterality: Right;   lap for adhesions     LEFT HEART CATHETERIZATION WITH CORONARY ANGIOGRAM N/A 01/21/2012   Procedure: LEFT HEART CATHETERIZATION WITH CORONARY ANGIOGRAM;  Surgeon: Erick JONELLE Bergamo, MD;  Location: [facility];  Service: Cardiovascular;  Laterality: N/A;   OOPHORECTOMY     SHOULDER ARTHROSCOPY Left 11/25/2019   Procedure: SHOULDER ARTHROSCOPY WITH DEBRIDEMENT EXTENSIVE WITH LYSIS OF ADHESIONS;  Surgeon: Cristy Bonner DASEN, MD;  Location: [facility];  Service: Orthopedics;  Laterality: Left;   SHOULDER CLOSED REDUCTION Left 11/25/2019   Procedure: CLOSED MANIPULATION SHOULDER;  Surgeon: Cristy Bonner DASEN, MD;  Location: [facility];  Service: Orthopedics;  Laterality: Left;   SYNOVECTOMY Right 12/26/2022   Procedure: SYNOVECTOMY;  Surgeon: Cristy Bonner DASEN, MD;  Location: [facility];  Service: Orthopedics;  Laterality: Right;   TOTAL ABDOMINAL HYSTERECTOMY  [employer]   Social History   Socioeconomic History   Marital status: Married    Spouse name: Not on file   Number of children: 3   Years of education: Not on file   Highest education level: Not on file  Occupational History   Occupation: Geographical Information Systems Officer    Employer: [employer]  Tobacco Use   Smoking status: Never   Smokeless tobacco: Never  Vaping Use   Vaping status: Never Used  Substance and Sexual Activity   Alcohol use: No   Drug use: No   Sexual activity: Not on file  Other Topics Concern   Not on file  Social History Narrative   Not on file   Social Drivers of Health   Tobacco Use: Low Risk (08/09/2024)   Patient History    Smoking Tobacco Use: Never    Smokeless Tobacco Use: Never    Passive Exposure: Not on file   Financial Resource Strain: Not on file  Food Insecurity: Not on file  Transportation Needs: Not on file  Physical Activity: Not on file  Stress: Not on file  Social Connections: Not on file  Depression (PHQ2-9): Medium Risk (10/24/2021)   Depression (PHQ2-9)    PHQ-2 Score: 9  Alcohol Screen: Not on file  Housing: Not on file  Utilities: Not on file  Health Literacy: Not on file   Family History  Problem Relation Age of Onset   Hypertension Mother    Heart disease Father        3 Stent placement   Prostate cancer Father    Hypertension Father    Heart disease Brother    Hypertension Brother    Hyperlipidemia Brother    Allergies[1] Prior to Admission medications  Medication Sig Start Date End Date Taking? Authorizing Provider  ALPRAZolam  (XANAX ) 1 MG tablet TAKE ONE TABLET BY MOUTH UP TO 3 TIMES DAILY AS NEEDED FOR ANXIETY 06/16/24  Yes Rosalynn Camie CROME, MD  cholecalciferol (VITAMIN D3) 25 MCG (1000 UNIT) tablet Take 1,000 Units by mouth.   Yes [provider]  citalopram  (CELEXA ) 40 MG tablet Take 1 tablet (40 mg total) by mouth daily. 06/16/24  Yes Rosalynn Camie CROME, MD  ezetimibe  (ZETIA ) 10 MG tablet Take 1 tablet (10 mg total) by mouth daily. 06/16/24  Yes Rosalynn Camie CROME, MD  methylphenidate  (RITALIN ) 20 MG tablet Take 1 tablet (20 mg total) by mouth daily. 07/26/24  Yes Rosalynn Camie CROME, MD  metoprolol  tartrate (LOPRESSOR ) 25 MG tablet Take 1 tablet (25 mg total) by mouth daily. 06/16/24  Yes Rosalynn Camie CROME, MD  montelukast  (SINGULAIR ) 10 MG tablet Take 1 tablet (10 mg total) by mouth at bedtime. TAKE 1 TABLET BY MOUTH EVERYDAY AT BEDTIME 06/16/24  Yes Rosalynn Camie CROME, MD  olmesartan  (BENICAR ) 40 MG tablet Take 1 tablet (40 mg total) by mouth daily. 06/16/24  Yes Rosalynn Camie CROME, MD  RABEprazole (ACIPHEX) 20 MG tablet Take 20 mg by mouth 2 (two) times daily.   Yes [provider]  rosuvastatin  (CRESTOR ) 20 MG tablet Take 1 tablet (20 mg total) by mouth daily. 06/16/24  Yes Rosalynn Camie CROME, MD  traMADol  (ULTRAM ) 50 MG tablet Take 1 tablet (50 mg total) by mouth every 8 (eight) hours as needed for severe pain (pain score 7-10). 05/10/24  Yes Hudnall, Ludie SAUNDERS, MD  albuterol  (VENTOLIN  HFA) 108 (90 Base) MCG/ACT inhaler Inhale 2 puffs into the lungs. 04/05/24   [provider]  aspirin  81 MG chewable tablet Chew 81 mg by mouth daily.    [provider]  azelastine  (ASTELIN ) 0.1 % nasal spray Place 1 spray into both nostrils 2 (two) times daily. Use in each nostril as directed 04/29/24   Hope Almarie ORN, NP  fluticasone -salmeterol (ADVAIR) 250-50 MCG/ACT AEPB Inhale 1 puff into the lungs every 12 (twelve) hours. 07/26/24   Kara Dorn NOVAK, MD  nitroGLYCERIN  (NITROSTAT ) 0.4 MG SL tablet Place 1 tablet (0.4 mg total) under the  tongue every 5 (five) minutes x 3 doses as needed for chest pain. 07/31/15 02/12/26  Rosalynn Camie CROME, MD  oseltamivir  (TAMIFLU ) 75 MG capsule Take 1 capsule (75 mg total) by mouth daily. 06/23/24   Rosalynn Camie CROME, MD  PATADAY  0.2 % SOLN INSTILL ONE DROP INTO THE AFFECTED EYE(S) ONCE DAILY AS DIRECTED Patient taking differently: 1 drop See admin instructions. In affected eye as needed for allergic conjuctvitis 10/10/16   Rosalynn Camie CROME, MD    ROS: All other systems have been reviewed and were otherwise negative with the exception of those mentioned in the HPI and as above.  Physical Exam: General: Alert, no acute distress Cardiovascular: No pedal edema Respiratory: No cyanosis, no use of accessory musculature GI: No organomegaly, abdomen is soft and non-tender Skin: No lesions in the area of chief complaint Neurologic: Sensation intact distally Psychiatric: Patient is competent for consent with normal mood and affect Lymphatic: No axillary or cervical lymphadenopathy  MUSCULOSKELETAL:  Continues to have weakness with subscapularis and supraspinatus strength 4/5. Positive O'Brien speeds, AC tenderness to palpation, and impingement signs.    Imaging: MRI demonstrates full thickness rotator cuff tear, edema in the distal clavicle, fluid around the biceps, upper border subscapularis tear with medialization of the biceps, subacromial bursitis.  Assessment: Complete tear of right rotator cuff, unspecified whether traumatic, Articular cartilage disorder of shoulder , Impingement syndrome of right shoulder  Plan: Plan for Procedures: ARTHROSCOPY, SHOULDER, WITH ROTATOR CUFF REPAIR  The risks benefits and alternatives were discussed with the patient including but not limited to the risks of nonoperative treatment, versus surgical intervention including infection, bleeding, nerve injury,  blood clots, cardiopulmonary complications, morbidity, mortality, among others, and they were willing to proceed.   The patient acknowledged the explanation, agreed to proceed with the plan and consent was signed.   Operative Plan: RSS rotator cuff repair, biceps tenodesis, subacromial decompression, and distal clavicle excision  Discharge Medications: standard DVT Prophylaxis: none Physical Therapy: outpatient  Special Discharge needs: Sling (should bring with her). IceMan   Aleck LOISE Stalling, PA-C  08/10/2024 9:03 AM     [1]  Allergies Allergen Reactions   Penicillins Anaphylaxis   Morphine And Codeine  Nausea And Vomiting and Other (See Comments)   Propofol      Cardiac dysrhthmias after EGD   Bactrim  [Sulfamethoxazole -Trimethoprim ] Nausea Only    Intolerance only   Erythromycin Nausea And Vomiting and Rash   Tetracyclines & Related Nausea And Vomiting and Rash   Vibramycin [Doxycycline Calcium ] Nausea And Vomiting and Rash   "

## 2024-08-10 NOTE — Progress Notes (Signed)
 patient called Miranda Baker on 1/6 stating she tested positive for the flu on 1/6 and will need to reschedule surgery. Informed her she will need to also contact dr. Raguel office

## 2024-08-12 ENCOUNTER — Ambulatory Visit (HOSPITAL_BASED_OUTPATIENT_CLINIC_OR_DEPARTMENT_OTHER): Admission: RE | Admit: 2024-08-12 | Source: Home / Self Care | Admitting: Orthopaedic Surgery

## 2024-08-12 ENCOUNTER — Encounter (HOSPITAL_BASED_OUTPATIENT_CLINIC_OR_DEPARTMENT_OTHER): Admission: RE | Payer: Self-pay | Source: Home / Self Care

## 2024-08-12 DIAGNOSIS — I10 Essential (primary) hypertension: Secondary | ICD-10-CM

## 2024-08-12 SURGERY — ARTHROSCOPY, SHOULDER, WITH ROTATOR CUFF REPAIR
Anesthesia: General | Laterality: Right

## 2024-08-26 NOTE — Progress Notes (Signed)
 Called out to Dr. Henri office and spoke with Joen to get cards clearance and asa hold orders for upcoming surgery on 09/02/2024

## 2024-08-27 ENCOUNTER — Encounter (HOSPITAL_BASED_OUTPATIENT_CLINIC_OR_DEPARTMENT_OTHER): Payer: Self-pay | Admitting: Orthopaedic Surgery

## 2024-08-31 ENCOUNTER — Ambulatory Visit: Admitting: Dermatology

## 2024-08-31 DIAGNOSIS — C4491 Basal cell carcinoma of skin, unspecified: Secondary | ICD-10-CM

## 2024-08-31 HISTORY — DX: Basal cell carcinoma of skin, unspecified: C44.91

## 2024-08-31 NOTE — H&P (Signed)
 "   PREOPERATIVE H&P  Chief Complaint: Complete tear of right rotator cuff, unspecified whether traumatic, Articular cartilage disorder of shoulder , Impingement syndrome of right shoulder  HPI: Miranda Baker is a 64 y.o. female who is scheduled for, Procedures: ARTHROSCOPY, SHOULDER, WITH ROTATOR CUFF REPAIR.   Patient has a past medical history significant for GERD, HTN.   Miranda Baker is a 64 year old female who presents for evaluation of right shoulder pain. She reports that the pain has been persistent since October and is associated with weakness and limited range of motion. 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 The patient has been experiencing significant discomfort, impacting her daily activities, including her work, which involves sitting and typing all day. She is concerned about the recovery process and the impact on her work schedule.   Symptoms are rated as moderate to severe, and have been worsening.  This is significantly impairing activities of daily living.    Please see clinic note for further details on this patient's care.    She has elected for surgical management.   Past Medical History:  Diagnosis Date   Anxiety    Cellulitis    Complication of anesthesia    propofol  caused dysrhytmias and patient had to be admitted   Constipation    Coronary artery disease    Stent 2.75x15 Xience 2011   Depression    GERD (gastroesophageal reflux disease)    Hyperlipidemia    Hypertension    Laboratory examination 09/24/2018   MI (mitral incompetence)    Paresthesia 09/24/2018   PONV (postoperative nausea and vomiting) 10/24/2020   Past Surgical History:  Procedure Laterality Date   COLONOSCOPY WITH ESOPHAGOGASTRODUODENOSCOPY (EGD)     01/05/2019   ESOPHAGOGASTRODUODENOSCOPY N/A 10/07/2012   Procedure: ESOPHAGOGASTRODUODENOSCOPY (EGD);  Surgeon: Elsie Cree, MD;  Location: [facility];  Service: Endoscopy;   Laterality: N/A;   HERNIA REPAIR     KNEE ARTHROSCOPY WITH MEDIAL MENISECTOMY Right 12/26/2022   Procedure: KNEE ARTHROSCOPY WITH MEDIAL MENISECTOMY;  Surgeon: Cristy Bonner DASEN, MD;  Location: [facility];  Service: Orthopedics;  Laterality: Right;   lap for adhesions     LEFT HEART CATHETERIZATION WITH CORONARY ANGIOGRAM N/A 01/21/2012   Procedure: LEFT HEART CATHETERIZATION WITH CORONARY ANGIOGRAM;  Surgeon: Erick JONELLE Bergamo, MD;  Location: [facility] CATH [facility];  Service: Cardiovascular;  Laterality: N/A;   OOPHORECTOMY     SHOULDER ARTHROSCOPY Left 11/25/2019   Procedure: SHOULDER ARTHROSCOPY WITH DEBRIDEMENT EXTENSIVE WITH LYSIS OF ADHESIONS;  Surgeon: Cristy Bonner DASEN, MD;  Location: [facility];  Service: Orthopedics;  Laterality: Left;   SHOULDER CLOSED REDUCTION Left 11/25/2019   Procedure: CLOSED MANIPULATION SHOULDER;  Surgeon: Cristy Bonner DASEN, MD;  Location: [facility];  Service: Orthopedics;  Laterality: Left;   SYNOVECTOMY Right 12/26/2022   Procedure: SYNOVECTOMY;  Surgeon: Cristy Bonner DASEN, MD;  Location: [facility];  Service: Orthopedics;  Laterality: Right;   TOTAL ABDOMINAL HYSTERECTOMY  [employer]   Social History   Socioeconomic History   Marital status: Married    Spouse name: Not on file   Number of children: 3   Years of education: Not on file   Highest education level: Not on file  Occupational History   Occupation: Geographical Information Systems Officer    Employer: [employer]  Tobacco Use   Smoking status: Never   Smokeless tobacco: Never  Vaping Use   Vaping status: Never Used  Substance and Sexual Activity   Alcohol use: No   Drug use: No   Sexual activity: Not on file  Other Topics Concern   Not on file  Social History Narrative   Not on file   Social Drivers of Health   Tobacco Use: Low Risk (08/27/2024)   Patient History    Smoking Tobacco Use: Never    Smokeless Tobacco Use: Never    Passive Exposure: Not on file   Financial Resource Strain: Not on file  Food Insecurity: Not on file  Transportation Needs: Not on file  Physical Activity: Not on file  Stress: Not on file  Social Connections: Not on file  Depression (PHQ2-9): Medium Risk (10/24/2021)   Depression (PHQ2-9)    PHQ-2 Score: 9  Alcohol Screen: Not on file  Housing: Not on file  Utilities: Not on file  Health Literacy: Not on file   Family History  Problem Relation Age of Onset   Hypertension Mother    Heart disease Father        3 Stent placement   Prostate cancer Father    Hypertension Father    Heart disease Brother    Hypertension Brother    Hyperlipidemia Brother    Allergies[1] Prior to Admission medications  Medication Sig Start Date End Date Taking? Authorizing Provider  ALPRAZolam  (XANAX ) 1 MG tablet TAKE ONE TABLET BY MOUTH UP TO 3 TIMES DAILY AS NEEDED FOR ANXIETY 06/16/24  Yes Rosalynn Camie CROME, MD  cholecalciferol (VITAMIN D3) 25 MCG (1000 UNIT) tablet Take 1,000 Units by mouth.   Yes [provider]  citalopram  (CELEXA ) 40 MG tablet Take 1 tablet (40 mg total) by mouth daily. 06/16/24  Yes Rosalynn Camie CROME, MD  ezetimibe  (ZETIA ) 10 MG tablet Take 1 tablet (10 mg total) by mouth daily. 06/16/24  Yes Rosalynn Camie CROME, MD  methylphenidate  (RITALIN ) 20 MG tablet Take 1 tablet (20 mg total) by mouth daily. 07/26/24  Yes Rosalynn Camie CROME, MD  metoprolol  tartrate (LOPRESSOR ) 25 MG tablet Take 1 tablet (25 mg total) by mouth daily. 06/16/24  Yes Rosalynn Camie CROME, MD  montelukast  (SINGULAIR ) 10 MG tablet Take 1 tablet (10 mg total) by mouth at bedtime. TAKE 1 TABLET BY MOUTH EVERYDAY AT BEDTIME 06/16/24  Yes Rosalynn Camie CROME, MD  olmesartan  (BENICAR ) 40 MG tablet Take 1 tablet (40 mg total) by mouth daily. 06/16/24  Yes Rosalynn Camie CROME, MD  RABEprazole (ACIPHEX) 20 MG tablet Take 20 mg by mouth 2 (two) times daily.   Yes [provider]  rosuvastatin  (CRESTOR ) 20 MG tablet Take 1 tablet (20 mg total) by mouth daily. 06/16/24  Yes Rosalynn Camie CROME, MD  traMADol  (ULTRAM ) 50 MG tablet Take 1 tablet (50 mg total) by mouth every 8 (eight) hours as needed for severe pain (pain score 7-10). 05/10/24  Yes Hudnall, Ludie SAUNDERS, MD  albuterol  (VENTOLIN  HFA) 108 (90 Base) MCG/ACT inhaler Inhale 2 puffs into the lungs. 04/05/24   [provider]  aspirin  81 MG chewable tablet Chew 81 mg by mouth daily.    [provider]  azelastine  (ASTELIN ) 0.1 % nasal spray Place 1 spray into both nostrils 2 (two) times daily. Use in each nostril as directed 04/29/24   Hope Almarie ORN, NP  fluticasone -salmeterol (ADVAIR) 250-50 MCG/ACT AEPB Inhale 1 puff into the lungs every 12 (twelve) hours. 07/26/24   Kara Dorn NOVAK, MD  nitroGLYCERIN  (NITROSTAT ) 0.4 MG SL tablet Place 1 tablet (0.4 mg total) under the  tongue every 5 (five) minutes x 3 doses as needed for chest pain. 07/31/15 02/12/26  Rosalynn Camie CROME, MD  oseltamivir  (TAMIFLU ) 75 MG capsule Take 1 capsule (75 mg total) by mouth daily. 06/23/24   Rosalynn Camie CROME, MD  PATADAY  0.2 % SOLN INSTILL ONE DROP INTO THE AFFECTED EYE(S) ONCE DAILY AS DIRECTED Patient taking differently: 1 drop See admin instructions. In affected eye as needed for allergic conjuctvitis 10/10/16   Rosalynn Camie CROME, MD    ROS: All other systems have been reviewed and were otherwise negative with the exception of those mentioned in the HPI and as above.  Physical Exam: General: Alert, no acute distress Cardiovascular: No pedal edema Respiratory: No cyanosis, no use of accessory musculature GI: No organomegaly, abdomen is soft and non-tender Skin: No lesions in the area of chief complaint Neurologic: Sensation intact distally Psychiatric: Patient is competent for consent with normal mood and affect Lymphatic: No axillary or cervical lymphadenopathy  MUSCULOSKELETAL:  Continues to have weakness with subscapularis and supraspinatus strength 4/5. Positive O'Brien speeds, AC tenderness to palpation, and impingement signs.    Imaging: MRI demonstrates full thickness rotator cuff tear, edema in the distal clavicle, fluid around the biceps, upper border subscapularis tear with medialization of the biceps, subacromial bursitis.  Assessment: Complete tear of right rotator cuff, unspecified whether traumatic, Articular cartilage disorder of shoulder , Impingement syndrome of right shoulder  Plan: Plan for Procedures: ARTHROSCOPY, SHOULDER, WITH ROTATOR CUFF REPAIR  The risks benefits and alternatives were discussed with the patient including but not limited to the risks of nonoperative treatment, versus surgical intervention including infection, bleeding, nerve injury,  blood clots, cardiopulmonary complications, morbidity, mortality, among others, and they were willing to proceed.   The patient acknowledged the explanation, agreed to proceed with the plan and consent was signed.   Operative Plan: RSS rotator cuff repair, biceps tenodesis, subacromial decompression, and distal clavicle excision  Discharge Medications: standard DVT Prophylaxis: none Physical Therapy: outpatient  Special Discharge needs: Sling (should bring with her). IceMan   Miranda LOISE Stalling, PA-C  08/31/2024 8:52 AM     [1]  Allergies Allergen Reactions   Penicillins Anaphylaxis   Morphine And Codeine  Nausea And Vomiting and Other (See Comments)   Propofol      Cardiac dysrhthmias after EGD   Bactrim  [Sulfamethoxazole -Trimethoprim ] Nausea Only    Intolerance only   Erythromycin Nausea And Vomiting and Rash   Tetracyclines & Related Nausea And Vomiting and Rash   Vibramycin [Doxycycline Calcium ] Nausea And Vomiting and Rash   "

## 2024-09-01 ENCOUNTER — Encounter: Payer: Self-pay | Admitting: Dermatology

## 2024-09-01 ENCOUNTER — Ambulatory Visit: Admitting: Dermatology

## 2024-09-01 VITALS — BP 129/78 | HR 83

## 2024-09-01 DIAGNOSIS — L578 Other skin changes due to chronic exposure to nonionizing radiation: Secondary | ICD-10-CM | POA: Diagnosis not present

## 2024-09-01 DIAGNOSIS — D489 Neoplasm of uncertain behavior, unspecified: Secondary | ICD-10-CM

## 2024-09-01 DIAGNOSIS — C44319 Basal cell carcinoma of skin of other parts of face: Secondary | ICD-10-CM

## 2024-09-01 DIAGNOSIS — W908XXA Exposure to other nonionizing radiation, initial encounter: Secondary | ICD-10-CM | POA: Diagnosis not present

## 2024-09-01 NOTE — Patient Instructions (Addendum)

## 2024-09-01 NOTE — Progress Notes (Signed)
" ° °  New Patient Visit   History of Present Illness Miranda Baker is a 64 year old female who presents with a spot on her cheek. She was referred by her family doctor for further evaluation.  She has had a spot on her cheek for about six months. It appeared suddenly, continued to grow, but has now stabilized. There is no associated itching, pain, or bleeding, though it is an annoyance due to its sudden appearance.  She has no history of skin cancer and has not undergone a skin biopsy before.  Patient states she has Spot check located at the right cheek that she would like to have examined. Patient reports the areas have been there for 6 months. She reports the areas are not bothersome. She states that the areas have not spread. Patient reports she has not previously been treated for these areas. Patient denies Hx of bx. Patient denies family history of skin cancer(s).  The following portions of the chart were reviewed this encounter and updated as appropriate: medications, allergies, medical history  Review of Systems:  No other skin or systemic complaints except as noted in HPI or Assessment and Plan.  Objective  Well appearing patient in no apparent distress; mood and affect are within normal limits.   A focused examination was performed of the following areas: Face  Relevant exam findings are noted in the Assessment and Plan.  Right Parotid Area 0.7 cm pink pearly papule   Assessment & Plan    ACTINIC DAMAGE - chronic, secondary to cumulative UV radiation exposure/sun exposure over time - diffuse scaly erythematous macules with underlying dyspigmentation - Recommend daily broad spectrum sunscreen SPF 30+ to sun-exposed areas, reapply every 2 hours as needed.  - Recommend staying in the shade or wearing long sleeves, sun glasses (UVA+UVB protection) and wide brim hats (4-inch brim around the entire circumference of the hat). - Call for new or changing lesions.  NEOPLASM OF  UNCERTAIN BEHAVIOR Right Parotid Area - Skin / nail biopsy Type of biopsy: tangential   Informed consent: discussed and consent obtained   Timeout: patient name, date of birth, surgical site, and procedure verified   Procedure prep:  Patient was prepped and draped in usual sterile fashion Prep type:  Isopropyl alcohol Anesthesia: the lesion was anesthetized in a standard fashion   Anesthetic:  1% lidocaine  w/ epinephrine  1-100,000 buffered w/ 8.4% NaHCO3 Instrument used: flexible razor blade   Hemostasis achieved with: pressure, aluminum chloride and electrodesiccation   Outcome: patient tolerated procedure well   Post-procedure details: sterile dressing applied and wound care instructions given   Dressing type: bandage and petrolatum    Specimen 1 - Surgical pathology Differential Diagnosis: R/O NMSC  Check Margins: No ACTINIC SKIN DAMAGE    Return for TBSE.  I, Doyce Pan, CMA, am acting as scribe for RUFUS CHRISTELLA HOLY, MD.   Documentation: I have reviewed the above documentation for accuracy and completeness, and I agree with the above.  RUFUS CHRISTELLA HOLY, MD     "

## 2024-09-02 ENCOUNTER — Ambulatory Visit: Payer: Self-pay | Admitting: Dermatology

## 2024-09-02 ENCOUNTER — Ambulatory Visit (HOSPITAL_BASED_OUTPATIENT_CLINIC_OR_DEPARTMENT_OTHER)
Admission: RE | Admit: 2024-09-02 | Discharge: 2024-09-02 | Disposition: A | Discharge status: Home / Self Care | Attending: Orthopaedic Surgery | Admitting: Orthopaedic Surgery

## 2024-09-02 ENCOUNTER — Ambulatory Visit (HOSPITAL_BASED_OUTPATIENT_CLINIC_OR_DEPARTMENT_OTHER): Admitting: Anesthesiology

## 2024-09-02 ENCOUNTER — Encounter (HOSPITAL_BASED_OUTPATIENT_CLINIC_OR_DEPARTMENT_OTHER): Admission: RE | Disposition: A | Payer: Self-pay | Source: Home / Self Care | Attending: Orthopaedic Surgery

## 2024-09-02 ENCOUNTER — Other Ambulatory Visit: Payer: Self-pay

## 2024-09-02 ENCOUNTER — Encounter (HOSPITAL_BASED_OUTPATIENT_CLINIC_OR_DEPARTMENT_OTHER): Payer: Self-pay | Admitting: Orthopaedic Surgery

## 2024-09-02 DIAGNOSIS — Z955 Presence of coronary angioplasty implant and graft: Secondary | ICD-10-CM | POA: Diagnosis not present

## 2024-09-02 DIAGNOSIS — I10 Essential (primary) hypertension: Secondary | ICD-10-CM | POA: Diagnosis not present

## 2024-09-02 DIAGNOSIS — F418 Other specified anxiety disorders: Secondary | ICD-10-CM

## 2024-09-02 DIAGNOSIS — M75121 Complete rotator cuff tear or rupture of right shoulder, not specified as traumatic: Secondary | ICD-10-CM

## 2024-09-02 DIAGNOSIS — X58XXXA Exposure to other specified factors, initial encounter: Secondary | ICD-10-CM | POA: Insufficient documentation

## 2024-09-02 DIAGNOSIS — F32A Depression, unspecified: Secondary | ICD-10-CM | POA: Diagnosis not present

## 2024-09-02 DIAGNOSIS — K219 Gastro-esophageal reflux disease without esophagitis: Secondary | ICD-10-CM | POA: Insufficient documentation

## 2024-09-02 DIAGNOSIS — S43431A Superior glenoid labrum lesion of right shoulder, initial encounter: Secondary | ICD-10-CM | POA: Insufficient documentation

## 2024-09-02 DIAGNOSIS — F419 Anxiety disorder, unspecified: Secondary | ICD-10-CM | POA: Diagnosis not present

## 2024-09-02 DIAGNOSIS — I2510 Atherosclerotic heart disease of native coronary artery without angina pectoris: Secondary | ICD-10-CM | POA: Diagnosis not present

## 2024-09-02 DIAGNOSIS — M25811 Other specified joint disorders, right shoulder: Secondary | ICD-10-CM | POA: Diagnosis not present

## 2024-09-02 DIAGNOSIS — M19011 Primary osteoarthritis, right shoulder: Secondary | ICD-10-CM | POA: Insufficient documentation

## 2024-09-02 DIAGNOSIS — M7521 Bicipital tendinitis, right shoulder: Secondary | ICD-10-CM | POA: Diagnosis not present

## 2024-09-02 LAB — SURGICAL PATHOLOGY

## 2024-09-02 MED ORDER — LACTATED RINGERS IV SOLN: INTRAVENOUS | Status: DC

## 2024-09-02 MED ORDER — MIDAZOLAM HCL (PF) 2 MG/2ML IJ SOLN
1.0000 mg | Freq: Once | INTRAMUSCULAR | Status: AC
  Administered 2024-09-02: 1 mg via INTRAVENOUS

## 2024-09-02 MED ORDER — ONDANSETRON HCL 4 MG/2ML IJ SOLN
INTRAMUSCULAR | Status: AC
  Filled 2024-09-02: qty 2

## 2024-09-02 MED ORDER — FENTANYL CITRATE (PF) 100 MCG/2ML IJ SOLN
25.0000 ug | INTRAMUSCULAR | Status: DC | PRN
  Administered 2024-09-02: 50 ug via INTRAVENOUS

## 2024-09-02 MED ORDER — ACETAMINOPHEN 500 MG PO TABS: 1000.0000 mg | ORAL_TABLET | Freq: Three times a day (TID) | ORAL | 0 refills | Status: AC

## 2024-09-02 MED ORDER — VANCOMYCIN HCL IN DEXTROSE 1-5 GM/200ML-% IV SOLN
1000.0000 mg | INTRAVENOUS | Status: AC
  Administered 2024-09-02: 1000 mg via INTRAVENOUS

## 2024-09-02 MED ORDER — GABAPENTIN 300 MG PO CAPS
300.0000 mg | ORAL_CAPSULE | Freq: Once | ORAL | Status: AC
  Administered 2024-09-02: 300 mg via ORAL

## 2024-09-02 MED ORDER — OXYCODONE HCL 5 MG/5ML PO SOLN: 5.0000 mg | Freq: Once | ORAL | Status: DC | PRN

## 2024-09-02 MED ORDER — FENTANYL CITRATE (PF) 100 MCG/2ML IJ SOLN
INTRAMUSCULAR | Status: AC
  Filled 2024-09-02: qty 2

## 2024-09-02 MED ORDER — ACETAMINOPHEN 500 MG PO TABS
ORAL_TABLET | ORAL | Status: AC
  Filled 2024-09-02: qty 2

## 2024-09-02 MED ORDER — SODIUM CHLORIDE 0.9 % IR SOLN
Status: DC | PRN
  Administered 2024-09-02 (×4): 3000 mL

## 2024-09-02 MED ORDER — PROPOFOL 10 MG/ML IV BOLUS
INTRAVENOUS | Status: AC
  Filled 2024-09-02: qty 20

## 2024-09-02 MED ORDER — DEXAMETHASONE SOD PHOSPHATE PF 10 MG/ML IJ SOLN
INTRAMUSCULAR | Status: DC | PRN
  Administered 2024-09-02: 10 mg via INTRAVENOUS

## 2024-09-02 MED ORDER — CELECOXIB 100 MG PO CAPS: 100.0000 mg | ORAL_CAPSULE | Freq: Two times a day (BID) | ORAL | 0 refills | Status: AC

## 2024-09-02 MED ORDER — BUPIVACAINE LIPOSOME 1.3 % IJ SUSP
INTRAMUSCULAR | Status: DC | PRN
  Administered 2024-09-02: 10 mL via PERINEURAL

## 2024-09-02 MED ORDER — SCOPOLAMINE 1 MG/3DAYS TD PT72
MEDICATED_PATCH | TRANSDERMAL | Status: AC
  Filled 2024-09-02: qty 1

## 2024-09-02 MED ORDER — DROPERIDOL 2.5 MG/ML IJ SOLN: 0.6250 mg | Freq: Once | INTRAMUSCULAR | Status: DC | PRN

## 2024-09-02 MED ORDER — LIDOCAINE 2% (20 MG/ML) 5 ML SYRINGE
INTRAMUSCULAR | Status: DC | PRN
  Administered 2024-09-02: 100 mg via INTRAVENOUS

## 2024-09-02 MED ORDER — ACETAMINOPHEN 10 MG/ML IV SOLN: 1000.0000 mg | Freq: Once | INTRAVENOUS | Status: DC | PRN

## 2024-09-02 MED ORDER — SUGAMMADEX SODIUM 200 MG/2ML IV SOLN
INTRAVENOUS | Status: DC | PRN
  Administered 2024-09-02: 200 mg via INTRAVENOUS

## 2024-09-02 MED ORDER — ONDANSETRON 4 MG PO TBDP: 4.0000 mg | ORAL_TABLET | Freq: Three times a day (TID) | ORAL | 0 refills | Status: AC | PRN

## 2024-09-02 MED ORDER — EPHEDRINE SULFATE (PRESSORS) 25 MG/5ML IV SOSY
PREFILLED_SYRINGE | INTRAVENOUS | Status: DC | PRN
  Administered 2024-09-02: 5 mg via INTRAVENOUS
  Administered 2024-09-02: 10 mg via INTRAVENOUS
  Administered 2024-09-02: 5 mg via INTRAVENOUS
  Administered 2024-09-02 (×2): 10 mg via INTRAVENOUS

## 2024-09-02 MED ORDER — DEXAMETHASONE SOD PHOSPHATE PF 10 MG/ML IJ SOLN
INTRAMUSCULAR | Status: AC
  Filled 2024-09-02: qty 1

## 2024-09-02 MED ORDER — TRANEXAMIC ACID-NACL 1000-0.7 MG/100ML-% IV SOLN
INTRAVENOUS | Status: AC
  Filled 2024-09-02: qty 100

## 2024-09-02 MED ORDER — MIDAZOLAM HCL 2 MG/2ML IJ SOLN
INTRAMUSCULAR | Status: AC
  Filled 2024-09-02: qty 2

## 2024-09-02 MED ORDER — BUPIVACAINE HCL (PF) 0.5 % IJ SOLN
INTRAMUSCULAR | Status: DC | PRN
  Administered 2024-09-02: 12 mL via PERINEURAL

## 2024-09-02 MED ORDER — LEVOFLOXACIN IN D5W 500 MG/100ML IV SOLN
500.0000 mg | INTRAVENOUS | Status: AC
  Administered 2024-09-02: 500 mg via INTRAVENOUS

## 2024-09-02 MED ORDER — ONDANSETRON HCL 4 MG/2ML IJ SOLN
INTRAMUSCULAR | Status: DC | PRN
  Administered 2024-09-02: 4 mg via INTRAVENOUS

## 2024-09-02 MED ORDER — ROCURONIUM BROMIDE 10 MG/ML (PF) SYRINGE
PREFILLED_SYRINGE | INTRAVENOUS | Status: DC | PRN
  Administered 2024-09-02: 60 mg via INTRAVENOUS

## 2024-09-02 MED ORDER — SCOPOLAMINE 1 MG/3DAYS TD PT72
1.0000 | MEDICATED_PATCH | TRANSDERMAL | Status: DC
  Administered 2024-09-02: 1 mg via TRANSDERMAL

## 2024-09-02 MED ORDER — OXYCODONE HCL 5 MG PO TABS: 5.0000 mg | ORAL_TABLET | Freq: Once | ORAL | Status: DC | PRN

## 2024-09-02 MED ORDER — VANCOMYCIN HCL IN DEXTROSE 1-5 GM/200ML-% IV SOLN
INTRAVENOUS | Status: AC
  Filled 2024-09-02: qty 200

## 2024-09-02 MED ORDER — EPHEDRINE 5 MG/ML INJ
INTRAVENOUS | Status: AC
  Filled 2024-09-02: qty 5

## 2024-09-02 MED ORDER — FENTANYL CITRATE (PF) 100 MCG/2ML IJ SOLN
50.0000 ug | Freq: Once | INTRAMUSCULAR | Status: AC
  Administered 2024-09-02: 50 ug via INTRAVENOUS

## 2024-09-02 MED ORDER — FENTANYL CITRATE (PF) 100 MCG/2ML IJ SOLN
INTRAMUSCULAR | Status: DC | PRN
  Administered 2024-09-02: 50 ug via INTRAVENOUS

## 2024-09-02 MED ORDER — GABAPENTIN 300 MG PO CAPS
ORAL_CAPSULE | ORAL | Status: AC
  Filled 2024-09-02: qty 1

## 2024-09-02 MED ORDER — ETOMIDATE 2 MG/ML IV SOLN
INTRAVENOUS | Status: DC | PRN
  Administered 2024-09-02: 20 mg via INTRAVENOUS

## 2024-09-02 MED ORDER — ACETAMINOPHEN 500 MG PO TABS
1000.0000 mg | ORAL_TABLET | Freq: Once | ORAL | Status: AC
  Administered 2024-09-02: 1000 mg via ORAL

## 2024-09-02 MED ORDER — TRANEXAMIC ACID-NACL 1000-0.7 MG/100ML-% IV SOLN
1000.0000 mg | INTRAVENOUS | Status: AC
  Administered 2024-09-02: 1000 mg via INTRAVENOUS

## 2024-09-02 MED ORDER — LEVOFLOXACIN IN D5W 500 MG/100ML IV SOLN
INTRAVENOUS | Status: AC
  Filled 2024-09-02: qty 100

## 2024-09-02 MED ORDER — OXYCODONE HCL 5 MG PO TABS: ORAL_TABLET | ORAL | 0 refills | Status: AC

## 2024-09-02 NOTE — Anesthesia Postprocedure Evaluation (Signed)
"   Anesthesia Post Note  Patient: Miranda Baker  Procedure(s) Performed: ARTHROSCOPY, SHOULDER, WITH ROTATOR CUFF REPAIR (Right)     Patient location during evaluation: PACU Anesthesia Type: General Level of consciousness: awake and alert Pain management: pain level controlled Vital Signs Assessment: post-procedure vital signs reviewed and stable Respiratory status: spontaneous breathing, nonlabored ventilation and respiratory function stable Cardiovascular status: blood pressure returned to baseline Postop Assessment: no apparent nausea or vomiting Anesthetic complications: no   No notable events documented.  Last Vitals:  Vitals:   09/02/24 1231 09/02/24 1254  BP: 136/72 (!) 157/77  Pulse: 93 98  Resp: 15 18  Temp:  (!) 36.2 C  SpO2: 91% 95%    Last Pain:  Vitals:   09/02/24 1254  TempSrc:   PainSc: 1                  Vertell Row      "

## 2024-09-02 NOTE — Anesthesia Procedure Notes (Signed)
 Procedure Name: Intubation Date/Time: 09/02/2024 10:06 AM  Performed by: Debarah Chiquita LABOR, CRNAPre-anesthesia Checklist: Patient identified, Emergency Drugs available, Suction available and Patient being monitored Patient Re-evaluated:Patient Re-evaluated prior to induction Oxygen Delivery Method: Circle system utilized Preoxygenation: Pre-oxygenation with 100% oxygen Induction Type: IV induction Ventilation: Mask ventilation without difficulty Laryngoscope Size: Glidescope and 3 Grade View: Grade I Tube type: Oral Tube size: 7.0 mm Number of attempts: 1 Airway Equipment and Method: Bite block, Rigid stylet and Video-laryngoscopy Placement Confirmation: ETT inserted through vocal cords under direct vision, positive ETCO2 and breath sounds checked- equal and bilateral Secured at: 21 cm Tube secured with: Tape Dental Injury: Teeth and Oropharynx as per pre-operative assessment

## 2024-09-02 NOTE — Transfer of Care (Signed)
 Immediate Anesthesia Transfer of Care Note  Patient: Miranda Baker  Procedure(s) Performed: ARTHROSCOPY, SHOULDER, WITH ROTATOR CUFF REPAIR (Right)  Patient Location: PACU  Anesthesia Type:GA combined with regional for post-op pain  Level of Consciousness: drowsy  Airway & Oxygen Therapy: Patient Spontanous Breathing and Patient connected to face mask oxygen  Post-op Assessment: Report given to RN and Post -op Vital signs reviewed and stable  Post vital signs: Reviewed and stable  Last Vitals:  Vitals Value Taken Time  BP 149/78 09/02/24 11:23  Temp    Pulse 103 09/02/24 11:27  Resp 16 09/02/24 11:27  SpO2 99 % 09/02/24 11:27  Vitals shown include unfiled device data.  Last Pain:  Vitals:   09/02/24 0752  TempSrc: Temporal  PainSc: 0-No pain      Patients Stated Pain Goal: 0 (09/02/24 0752)  Complications: No notable events documented.

## 2024-09-02 NOTE — Progress Notes (Signed)
Assisted Dr. Hollis with right, interscalene , ultrasound guided block. Side rails up, monitors on throughout procedure. See vital signs in flow sheet. Tolerated Procedure well. 

## 2024-09-02 NOTE — Interval H&P Note (Signed)
 All questions answered, patient wants to proceed with procedure. ? ?

## 2024-09-02 NOTE — Anesthesia Procedure Notes (Signed)
 Anesthesia Regional Block: Interscalene brachial plexus block   Pre-Anesthetic Checklist: , timeout performed,  Correct Patient, Correct Site, Correct Laterality,  Correct Procedure, Correct Position, site marked,  Risks and benefits discussed,  Surgical consent,  Pre-op evaluation,  At surgeon's request and post-op pain management  Laterality: Right  Prep: chloraprep       Needles:  Injection technique: Single-shot  Needle Type: Echogenic Stimulator Needle     Needle Length: 9cm  Needle Gauge: 21     Additional Needles:   Procedures:,,,, ultrasound used (permanent image in chart),,    Narrative:  Start time: 09/02/2024 8:30 AM End time: 09/02/2024 8:35 AM Injection made incrementally with aspirations every 5 mL.  Performed by: Personally  Anesthesiologist: Tilford Franky BIRCH, MD  Additional Notes: Discussed risks and benefits of the nerve block in detail, including but not limited vascular injury, permanent nerve damage and infection.   Patient tolerated the procedure well. Local anesthetic introduced in an incremental fashion under minimal resistance after negative aspirations. No paresthesias were elicited. After completion of the procedure, no acute issues were identified and patient continued to be monitored by RN.

## 2024-09-02 NOTE — Discharge Instructions (Addendum)
 Bonner Hair MD, MPH Aleck Stalling, PA-C [facility] [address], [address] [phone] (tel)   [phone] (fax)   POST-OPERATIVE INSTRUCTIONS - SHOULDER ARTHROSCOPY  WOUND CARE You may remove the Operative Dressing on Post-Op Day #3 (72hrs after surgery).   Alternatively if you would like you can leave dressing on until follow-up if within 7-8 days but keep it dry. Leave steri-strips in place until they fall off on their own, usually 2 weeks postop. There may be a small amount of fluid/bleeding leaking at the surgical site.  This is normal; the shoulder is filled with fluid during the procedure and can leak for 24-48hrs after surgery.  You may change/reinforce the bandage as needed.  Use the Cryocuff or Ice as often as possible for the first 7 days, then as needed for pain relief. Always keep a towel, ACE wrap or other barrier between the cooling unit and your skin.  You may shower on Post-Op Day #3. Gently pat the area dry.  Do not soak the shoulder in water or submerge it.  Keep incisions as dry as possible. Do not go swimming in the pool or ocean until 4 weeks after surgery or when otherwise instructed.    EXERCISES Wear the sling at all times  You may remove the sling for showering, but keep the arm across the chest or in a secondary sling.     It is normal for your fingers/hand to become more swollen after surgery and discolored from bruising.   This will resolve over the first few weeks usually after surgery. Please continue to ambulate and do not stay sitting or lying for too long.  Perform foot and wrist pumps to assist in circulation.  PHYSICAL THERAPY - You will begin physical therapy 2-3 weeks after surgery - Please call to set up an appointment, if you do not already have one  - Let our office if there are any issues with scheduling your therapy    REGIONAL ANESTHESIA (NERVE BLOCKS) The anesthesia team may have performed a nerve block for  you this is a great tool used to minimize pain.   The block may start wearing off overnight (between 8-24 hours postop) When the block wears off, your pain may go from nearly zero to the pain you would have had postop without the block. This is an abrupt transition but nothing dangerous is happening.   This can be a challenging period but utilize your as needed pain medications to try and manage this period. We suggest you use the pain medication the first night prior to going to bed, to ease this transition.  You may take an extra dose of narcotic when this happens if needed  POST-OP MEDICATIONS- Multimodal approach to pain control In general your pain will be controlled with a combination of substances.  Prescriptions unless otherwise discussed are electronically sent to your pharmacy.  This is a carefully made plan we use to minimize narcotic use.     Celebrex  - Anti-inflammatory medication taken on a scheduled basis Acetaminophen  - Non-narcotic pain medicine taken on a scheduled basis  Oxycodone  - This is a strong narcotic, to be used only on an as needed basis for SEVERE pain. Zofran  - take as needed for nausea  FOLLOW-UP If you develop a Fever (>=101.5), Redness or Drainage from the surgical incision site, please call our office to arrange for an evaluation. Please call the office to schedule a follow-up appointment for your first post-operative appointment, 7-10  days post-operatively.    HELPFUL INFORMATION   You may be more comfortable sleeping in a semi-seated position the first few nights following surgery.  Keep a pillow propped under the elbow and forearm for comfort.  If you have a recliner type of chair it might be beneficial.  If not that is fine too, but it would be helpful to sleep propped up with pillows behind your operated shoulder as well under your elbow and forearm.  This will reduce pulling on the suture lines.  When dressing, put your operative arm in the sleeve  first.  When getting undressed, take your operative arm out last.  Loose fitting, button-down shirts are recommended.  Often in the first days after surgery you may be more comfortable keeping your operative arm under your shirt and not through the sleeve.  You may return to work/school in the next couple of days when you feel up to it.  Desk work and typing in the sling is fine.  We suggest you use the pain medication the first night prior to going to bed, in order to ease any pain when the anesthesia wears off. You should avoid taking pain medications on an empty stomach as it will make you nauseous.  You should wean off your narcotic medicines as soon as you are able.  Most patients will be off narcotics before their first postop appointment.   Do not drink alcoholic beverages or take illicit drugs when taking pain medications.  It is against the law to drive while taking narcotics.  In some states it is against the law to drive while your arm is in a sling.   Pain medication may make you constipated.  Below are a few solutions to try in this order: Decrease the amount of pain medication if you aren't having pain. Drink lots of decaffeinated fluids. Drink prune juice and/or eat dried prunes  If the first 3 don't work start with additional solutions Take Colace - an over-the-counter stool softener Take Senokot - an over-the-counter laxative Take Miralax - a stronger over-the-counter laxative  For more information including helpful videos and documents visit our website:   [email or web address]     Post Anesthesia Home Care Instructions  Activity: Get plenty of rest for the remainder of the day. A responsible individual must stay with you for 24 hours following the procedure.  For the next 24 hours, DO NOT: -Drive a car -Advertising copywriter -Drink alcoholic beverages -Take any medication unless instructed by your physician -Make any legal decisions  or sign important papers.  Meals: Start with liquid foods such as gelatin or soup. Progress to regular foods as tolerated. Avoid greasy, spicy, heavy foods. If nausea and/or vomiting occur, drink only clear liquids until the nausea and/or vomiting subsides. Call your physician if vomiting continues.  Special Instructions/Symptoms: Your throat may feel dry or sore from the anesthesia or the breathing tube placed in your throat during surgery. If this causes discomfort, gargle with warm salt water. The discomfort should disappear within 24 hours.  If you had a scopolamine  patch placed behind your ear for the management of post- operative nausea and/or vomiting:  1. The medication in the patch is effective for 72 hours, after which it should be removed.  Wrap patch in a tissue and discard in the trash. Wash hands thoroughly with soap and water. 2. You may remove the patch earlier than 72 hours if you experience unpleasant side effects which may include dry mouth, dizziness or visual  disturbances. 3. Avoid touching the patch. Wash your hands with soap and water after contact with the patch.    Regional Anesthesia Blocks  1. You may not be able to move or feel the blocked extremity after a regional anesthetic block. This may last may last from 3-48 hours after placement, but it will go away. The length of time depends on the medication injected and your individual response to the medication. As the nerves start to wake up, you may experience tingling as the movement and feeling returns to your extremity. If the numbness and inability to move your extremity has not gone away after 48 hours, please call your surgeon.   2. The extremity that is blocked will need to be protected until the numbness is gone and the strength has returned. Because you cannot feel it, you will need to take extra care to avoid injury. Because it may be weak, you may have difficulty moving it or using it. You may not know what  position it is in without looking at it while the block is in effect.  3. For blocks in the legs and feet, returning to weight bearing and walking needs to be done carefully. You will need to wait until the numbness is entirely gone and the strength has returned. You should be able to move your leg and foot normally before you try and bear weight or walk. You will need someone to be with you when you first try to ensure you do not fall and possibly risk injury.  4. Bruising and tenderness at the needle site are common side effects and will resolve in a few days.  5. Persistent numbness or new problems with movement should be communicated to the surgeon or the [facility] [phone] [facility] [phone]). Information for Discharge Teaching: EXPAREL  (bupivacaine  liposome injectable suspension)   Pain relief is important to your recovery. The goal is to control your pain so you can move easier and return to your normal activities as soon as possible after your procedure. Your physician may use several types of medicines to manage pain, swelling, and more.  Your surgeon or anesthesiologist gave you EXPAREL (bupivacaine ) to help control your pain after surgery.  EXPAREL  is a local anesthetic designed to release slowly over an extended period of time to provide pain relief by numbing the tissue around the surgical site. EXPAREL  is designed to release pain medication over time and can control pain for up to 72 hours. Depending on how you respond to EXPAREL , you may require less pain medication during your recovery. EXPAREL  can help reduce or eliminate the need for opioids during the first few days after surgery when pain relief is needed the most. EXPAREL  is not an opioid and is not addictive. It does not cause sleepiness or sedation.   Important! A teal colored band has been placed on your arm with the date, time and amount of EXPAREL  you have received. Please leave  this armband in place for the full 96 hours following administration, and then you may remove the band. If you return to the hospital for any reason within 96 hours following the administration of EXPAREL , the armband provides important information that your health care providers to know, and alerts them that you have received this anesthetic.    Possible side effects of EXPAREL : Temporary loss of sensation or ability to move in the area where medication was injected. Nausea, vomiting, constipation Rarely, numbness and tingling in your mouth  or lips, lightheadedness, or anxiety may occur. Call your doctor right away if you think you may be experiencing any of these sensations, or if you have other questions regarding possible side effects.  Follow all other discharge instructions given to you by your surgeon or nurse. Eat a healthy diet and drink plenty of water or other fluids.  Next dose of tylenol  will be at 2pm today if needed

## 2024-09-02 NOTE — Anesthesia Preprocedure Evaluation (Addendum)
"                                    Anesthesia Evaluation  Patient identified by MRN, date of birth, ID band Patient awake    Reviewed: Allergy & Precautions, NPO status , Patient's Chart, lab work & pertinent test results  History of Anesthesia Complications (+) PONV and history of anesthetic complications  Airway Mallampati: II  TM Distance: <3 FB Neck ROM: Full    Dental  (+) Teeth Intact, Dental Advisory Given   Pulmonary neg pulmonary ROS   breath sounds clear to auscultation       Cardiovascular hypertension, + CAD and + Cardiac Stents   Rhythm:Regular Rate:Normal     Neuro/Psych  Headaches PSYCHIATRIC DISORDERS Anxiety Depression     Neuromuscular disease    GI/Hepatic Neg liver ROS,GERD  ,,  Endo/Other  negative endocrine ROS    Renal/GU negative Renal ROS     Musculoskeletal negative musculoskeletal ROS (+)    Abdominal   Peds  Hematology negative hematology ROS (+)   Anesthesia Other Findings   Reproductive/Obstetrics                              Anesthesia Physical Anesthesia Plan  ASA: 3  Anesthesia Plan: General   Post-op Pain Management: Regional block*   Induction: Intravenous  PONV Risk Score and Plan: 4 or greater and Ondansetron , Dexamethasone , Midazolam  and Scopolamine  patch - Pre-op  Airway Management Planned: Oral ETT  Additional Equipment: None  Intra-op Plan:   Post-operative Plan: Extubation in OR  Informed Consent: I have reviewed the patients History and Physical, chart, labs and discussed the procedure including the risks, benefits and alternatives for the proposed anesthesia with the patient or authorized representative who has indicated his/her understanding and acceptance.     Dental advisory given  Plan Discussed with: CRNA  Anesthesia Plan Comments: (Etomidate  for induction. )         Anesthesia Quick Evaluation  "

## 2024-09-03 ENCOUNTER — Encounter (HOSPITAL_BASED_OUTPATIENT_CLINIC_OR_DEPARTMENT_OTHER): Payer: Self-pay | Admitting: Orthopaedic Surgery

## 2024-09-06 ENCOUNTER — Ambulatory Visit: Payer: Self-pay

## 2024-09-06 ENCOUNTER — Ambulatory Visit: Admitting: Primary Care

## 2024-09-06 DIAGNOSIS — R0602 Shortness of breath: Secondary | ICD-10-CM

## 2024-09-06 DIAGNOSIS — Z9889 Other specified postprocedural states: Secondary | ICD-10-CM

## 2024-09-06 DIAGNOSIS — J4520 Mild intermittent asthma, uncomplicated: Secondary | ICD-10-CM | POA: Diagnosis not present

## 2024-09-06 DIAGNOSIS — J4521 Mild intermittent asthma with (acute) exacerbation: Secondary | ICD-10-CM

## 2024-09-06 NOTE — Telephone Encounter (Signed)
Lm for pt to call for results

## 2024-09-06 NOTE — Progress Notes (Signed)
 Spoke with pt gave her bx results and treatment recommendations

## 2024-09-06 NOTE — Telephone Encounter (Signed)
 FYI Only or Action Required?: Action required by provider: request for appointment.  Patient was last seen in primary care on 06/16/2024 by Rosalynn Camie CROME, MD.  Called Nurse Triage reporting Respiratory Distress.  Symptoms began several days ago.  Interventions attempted: Rest, hydration, or home remedies.  Symptoms are: gradually improving.Pt. Had shoulder surgery last Thursday with a block. Was told then that the block can make you feel SOB and is common. States her breathing is better but still has a hard time taking a deep breath. O2 saturation %93. Asking for advice.No wheezing.  Triage Disposition: See HCP Within 4 Hours (Or PCP Triage)  Patient/caregiver understands and will follow disposition?: YesReason for Triage: Patient has ben experiencing SOB following Surgery last Thursday. Persistent since Friday morning.    Reason for Disposition  [1] MILD difficulty breathing (e.g., minimal/no SOB at rest, SOB with walking, pulse < 100) AND [2] NEW-onset or WORSE than normal  Answer Assessment - Initial Assessment Questions 1. RESPIRATORY STATUS: Describe your breathing? (e.g., wheezing, shortness of breath, unable to speak, severe coughing)      Can't take a deep breath 2. ONSET: When did this breathing problem begin?      Last week after surgery 3. PATTERN Does the difficult breathing come and go, or has it been constant since it started?      Comes and goes 4. SEVERITY: How bad is your breathing? (e.g., mild, moderate, severe)      mild 5. RECURRENT SYMPTOM: Have you had difficulty breathing before? If Yes, ask: When was the last time? and What happened that time?      yes 6. CARDIAC HISTORY: Do you have any history of heart disease? (e.g., heart attack, angina, bypass surgery, angioplasty)      no 7. LUNG HISTORY: Do you have any history of lung disease?  (e.g., pulmonary embolus, asthma, emphysema)     asthma 8. CAUSE: What do you think is causing the  breathing problem?      surgery 9. OTHER SYMPTOMS: Do you have any other symptoms? (e.g., chest pain, cough, dizziness, fever, runny nose)     no 10. O2 SATURATION MONITOR:  Do you use an oxygen saturation monitor (pulse oximeter) at home? If Yes, ask: What is your reading (oxygen level) today? What is your usual oxygen saturation reading? (e.g., 95%)       93% 11. PREGNANCY: Is there any chance you are pregnant? When was your last menstrual period?       no 12. TRAVEL: Have you traveled out of the country in the last month? (e.g., travel history, exposures)       no  Protocols used: Breathing Difficulty-A-AH

## 2024-09-07 ENCOUNTER — Ambulatory Visit: Payer: Self-pay | Admitting: Primary Care

## 2024-09-07 ENCOUNTER — Other Ambulatory Visit

## 2024-09-07 ENCOUNTER — Ambulatory Visit (INDEPENDENT_AMBULATORY_CARE_PROVIDER_SITE_OTHER)

## 2024-09-07 DIAGNOSIS — R0602 Shortness of breath: Secondary | ICD-10-CM

## 2024-09-07 DIAGNOSIS — R7989 Other specified abnormal findings of blood chemistry: Secondary | ICD-10-CM

## 2024-09-07 LAB — D-DIMER, QUANTITATIVE: D-Dimer, Quant: 1.3 ug{FEU}/mL — ABNORMAL HIGH

## 2024-09-07 NOTE — Progress Notes (Signed)
 Called and spoke to pt - advised of CXR results per Landry Ferrari, NP. Pt verbalized understanding, NFN

## 2024-09-08 NOTE — Telephone Encounter (Signed)
-----   Message from Almarie Ferrari, NP sent at 09/07/2024  3:42 PM EST ----- Please let patient know d-dimer was slightly elevated. Recommend getting CTA to rule out PE due to recent surgery. Please order stat CTA re: elevated d-dimer. Call office/on call provider if  positive. They do not need to hold patient

## 2024-09-09 ENCOUNTER — Encounter (HOSPITAL_BASED_OUTPATIENT_CLINIC_OR_DEPARTMENT_OTHER): Payer: Self-pay

## 2024-09-09 ENCOUNTER — Ambulatory Visit (HOSPITAL_BASED_OUTPATIENT_CLINIC_OR_DEPARTMENT_OTHER)
Admission: RE | Admit: 2024-09-09 | Discharge: 2024-09-09 | Disposition: A | Source: Ambulatory Visit | Attending: Primary Care

## 2024-09-09 DIAGNOSIS — R7989 Other specified abnormal findings of blood chemistry: Secondary | ICD-10-CM

## 2024-09-09 DIAGNOSIS — R0602 Shortness of breath: Secondary | ICD-10-CM

## 2024-09-09 MED ORDER — IOHEXOL 350 MG/ML SOLN
100.0000 mL | Freq: Once | INTRAVENOUS | Status: AC | PRN
  Administered 2024-09-09: 75 mL via INTRAVENOUS

## 2024-09-09 NOTE — Telephone Encounter (Signed)
 LVM for patient to call and discuss scheduling

## 2024-10-07 ENCOUNTER — Encounter: Admitting: Dermatology
